# Patient Record
Sex: Female | Born: 1990
Health system: Southern US, Community
[De-identification: ages and names within clinical notes are randomized; demographics above are authoritative.]

## PROBLEM LIST (undated history)

## (undated) DIAGNOSIS — E079 Disorder of thyroid, unspecified: Secondary | ICD-10-CM

## (undated) DIAGNOSIS — E039 Hypothyroidism, unspecified: Secondary | ICD-10-CM

## (undated) DIAGNOSIS — K8020 Calculus of gallbladder without cholecystitis without obstruction: Secondary | ICD-10-CM

## (undated) DIAGNOSIS — G510 Bell's palsy: Secondary | ICD-10-CM

## (undated) DIAGNOSIS — F419 Anxiety disorder, unspecified: Secondary | ICD-10-CM

## (undated) DIAGNOSIS — R519 Headache, unspecified: Secondary | ICD-10-CM

## (undated) DIAGNOSIS — N83209 Unspecified ovarian cyst, unspecified side: Secondary | ICD-10-CM

## (undated) HISTORY — DX: Hypothyroidism, unspecified: E03.9

## (undated) HISTORY — PX: OTHER SURGICAL HISTORY: SHX169

## (undated) HISTORY — DX: Anxiety disorder, unspecified: F41.9

## (undated) HISTORY — DX: Calculus of gallbladder without cholecystitis without obstruction: K80.20

## (undated) HISTORY — PX: NO PAST SURGERIES: SHX2092

## (undated) HISTORY — DX: Disorder of thyroid, unspecified: E07.9

## (undated) HISTORY — DX: Unspecified ovarian cyst, unspecified side: N83.209

## (undated) HISTORY — DX: Bell's palsy: G51.0

---

## 2015-10-23 HISTORY — PX: LASIK: SHX215

## 2017-07-15 ENCOUNTER — Ambulatory Visit (HOSPITAL_BASED_OUTPATIENT_CLINIC_OR_DEPARTMENT_OTHER): Payer: BC Managed Care – PPO

## 2017-07-15 ENCOUNTER — Other Ambulatory Visit: Payer: Self-pay | Admitting: Family Medicine

## 2017-07-15 DIAGNOSIS — R109 Unspecified abdominal pain: Secondary | ICD-10-CM

## 2018-02-27 ENCOUNTER — Encounter: Payer: Self-pay | Admitting: Endocrinology

## 2018-07-18 DIAGNOSIS — J019 Acute sinusitis, unspecified: Secondary | ICD-10-CM | POA: Diagnosis not present

## 2018-07-30 DIAGNOSIS — H0014 Chalazion left upper eyelid: Secondary | ICD-10-CM | POA: Diagnosis not present

## 2018-09-08 DIAGNOSIS — E041 Nontoxic single thyroid nodule: Secondary | ICD-10-CM | POA: Diagnosis not present

## 2018-09-08 DIAGNOSIS — M2550 Pain in unspecified joint: Secondary | ICD-10-CM | POA: Diagnosis not present

## 2018-09-08 DIAGNOSIS — R946 Abnormal results of thyroid function studies: Secondary | ICD-10-CM | POA: Diagnosis not present

## 2018-09-08 DIAGNOSIS — R5383 Other fatigue: Secondary | ICD-10-CM | POA: Diagnosis not present

## 2018-09-08 DIAGNOSIS — Z8349 Family history of other endocrine, nutritional and metabolic diseases: Secondary | ICD-10-CM | POA: Diagnosis not present

## 2018-10-23 DIAGNOSIS — E038 Other specified hypothyroidism: Secondary | ICD-10-CM | POA: Diagnosis not present

## 2018-11-12 DIAGNOSIS — N926 Irregular menstruation, unspecified: Secondary | ICD-10-CM | POA: Diagnosis not present

## 2018-11-12 DIAGNOSIS — Z3202 Encounter for pregnancy test, result negative: Secondary | ICD-10-CM | POA: Diagnosis not present

## 2018-11-19 DIAGNOSIS — E039 Hypothyroidism, unspecified: Secondary | ICD-10-CM | POA: Diagnosis not present

## 2018-12-01 DIAGNOSIS — N390 Urinary tract infection, site not specified: Secondary | ICD-10-CM | POA: Diagnosis not present

## 2018-12-16 DIAGNOSIS — J019 Acute sinusitis, unspecified: Secondary | ICD-10-CM | POA: Diagnosis not present

## 2018-12-17 ENCOUNTER — Telehealth: Payer: Self-pay | Admitting: Internal Medicine

## 2018-12-17 NOTE — Telephone Encounter (Signed)
Unable to leave message. Called to inform patient on how to become a new patient.

## 2018-12-26 ENCOUNTER — Encounter: Payer: Self-pay | Admitting: Family Medicine

## 2018-12-26 ENCOUNTER — Ambulatory Visit: Payer: 59 | Admitting: Family Medicine

## 2018-12-26 VITALS — BP 118/82 | HR 77 | Temp 98.0°F | Ht 64.0 in | Wt 171.4 lb

## 2018-12-26 DIAGNOSIS — E039 Hypothyroidism, unspecified: Secondary | ICD-10-CM | POA: Diagnosis not present

## 2018-12-26 DIAGNOSIS — Z7689 Persons encountering health services in other specified circumstances: Secondary | ICD-10-CM | POA: Diagnosis not present

## 2018-12-26 LAB — TSH: TSH: 0.98 mIU/L

## 2018-12-26 LAB — T4, FREE: Free T4: 1.1 ng/dL (ref 0.8–1.8)

## 2018-12-26 NOTE — Progress Notes (Signed)
Sarah [name] is a 28 y.o. female  Chief Complaint  Patient presents with  . Establish Care    est care/ TSH check and TSH med refill.    HPI: Sarah [name] is a 28 y.o. female here to establish care with our office. She is from [place] and moved to [place] 2 years ago. Pt is a high school Retail buyer.  Pt has a h/o hypothyroidism and is due to have thyroid function checked and her medication refilled. She was diagnosed 3 years ago. She has been on the current dose of NP thyroid x 2 mo.  Pt does not take biotin supplement.   Last CPE, labs: 2019  Last PAP: 2018 - normal, as per pt  Med refills needed today: NP thyroid 30mg    Past Medical History:  Diagnosis Date  . Anxiety   . Bell's palsy   . Ovarian cyst   . Thyroid disease     History reviewed. No pertinent surgical history.  Social History   Socioeconomic History  . Marital status: Married    Spouse name: Not on file  . Number of children: Not on file  . Years of education: Not on file  . Highest education level: Not on file  Occupational History  . Not on file  Social Needs  . Financial resource strain: Not on file  . Food insecurity:    Worry: Not on file    Inability: Not on file  . Transportation needs:    Medical: Not on file    Non-medical: Not on file  Tobacco Use  . Smoking status: Never Smoker  . Smokeless tobacco: Never Used  Substance and Sexual Activity  . Alcohol use: Yes    Comment: socially  . Drug use: Never  . Sexual activity: Not on file  Lifestyle  . Physical activity:    Days per week: Not on file    Minutes per session: Not on file  . Stress: Not on file  Relationships  . Social connections:    Talks on phone: Not on file    Gets together: Not on file    Attends religious service: Not on file    Active member of club or organization: Not on file    Attends meetings of clubs or organizations: Not on file    Relationship status: Not on file  . Intimate partner violence:    Fear of current or ex partner: Not on file    Emotionally abused: Not on file    Physically abused: Not on file    Forced sexual activity: Not on file  Other Topics Concern  . Not on file  Social History Narrative  . Not on file    Family History  Problem Relation Age of Onset  . Hypertension Mother   . Hashimoto's thyroiditis Mother   . Cancer Maternal Grandmother        bone marrow cancer      There is no immunization history on file for this patient.  Outpatient Encounter Medications as of 12/26/2018  Medication Sig  . NP THYROID 30 MG tablet    No facility-administered encounter medications on file as of 12/26/2018.      ROS: Gen: no fever, chills  Skin: no rash, itching ENT: no ear pain, ear drainage, nasal congestion, rhinorrhea, sinus pressure, sore throat Eyes: no blurry vision, double vision Resp: no cough, wheeze,SOB CV: no CP, palpitations, LE edema,  GI: no heartburn, n/v/d/c, abd pain GU: no dysuria, urgency,  frequency, hematuria  MSK: no joint pain, myalgias, back pain Neuro: no dizziness, headache, weakness, vertigo Psych: no depression, anxiety, insomnia   Allergies  Allergen Reactions  . Azithromycin     GI-- stomach cramps  . Penicillins Hives    BP 118/82   Pulse 77   Temp 98 F (36.7 C) (Oral)   Ht 5\' 4"  (1.626 m)   Wt 171 lb 6.4 oz (77.7 kg)   SpO2 98%   BMI 29.42 kg/m   Physical Exam  Constitutional: She is oriented to person, place, and time. She appears well-developed and well-nourished. No distress.  Neck: Neck supple. No thyromegaly present.  Cardiovascular: Normal rate, regular rhythm and normal heart sounds.  Pulmonary/Chest: Effort normal and breath sounds normal.  Neurological: She is alert and oriented to person, place, and time.  Psychiatric: She has a normal mood and affect. Her behavior is normal.    A/P:  1. Hypothyroidism, unspecified type - T4, free - TSH - will cont current med/dose at this time and refill  once lab results available or change dose if necessary  2. Encounter to establish care with new doctor - due in summer 2020 for CPE, labs, PAP

## 2018-12-26 NOTE — Patient Instructions (Signed)
Hypothyroidism  Hypothyroidism is when the thyroid gland does not make enough of certain hormones (it is underactive). The thyroid gland is a small gland located in the lower front part of the neck, just in front of the windpipe (trachea). This gland makes hormones that help control how the body uses food for energy (metabolism) as well as how the heart and brain function. These hormones also play a role in keeping your bones strong. When the thyroid is underactive, it produces too little of the hormones thyroxine (T4) and triiodothyronine (T3). What are the causes? This condition may be caused by:  Hashimoto's disease. This is a disease in which the body's disease-fighting system (immune system) attacks the thyroid gland. This is the most common cause.  Viral infections.  Pregnancy.  Certain medicines.  Birth defects.  Past radiation treatments to the head or neck for cancer.  Past treatment with radioactive iodine.  Past exposure to radiation in the environment.  Past surgical removal of part or all of the thyroid.  Problems with a gland in the center of the brain (pituitary gland).  Lack of enough iodine in the diet. What increases the risk? You are more likely to develop this condition if:  You are female.  You have a family history of thyroid conditions.  You use a medicine called lithium.  You take medicines that affect the immune system (immunosuppressants). What are the signs or symptoms? Symptoms of this condition include:  Feeling as though you have no energy (lethargy).  Not being able to tolerate cold.  Weight gain that is not explained by a change in diet or exercise habits.  Lack of appetite.  Dry skin.  Coarse hair.  Menstrual irregularity.  Slowing of thought processes.  Constipation.  Sadness or depression. How is this diagnosed? This condition may be diagnosed based on:  Your symptoms, your medical history, and a physical exam.  Blood  tests. You may also have imaging tests, such as an ultrasound or MRI. How is this treated? This condition is treated with medicine that replaces the thyroid hormones that your body does not make. After you begin treatment, it may take several weeks for symptoms to go away. Follow these instructions at home:  Take over-the-counter and prescription medicines only as told by your health care provider.  If you start taking any new medicines, tell your health care provider.  Keep all follow-up visits as told by your health care provider. This is important. ? As your condition improves, your dosage of thyroid hormone medicine may change. ? You will need to have blood tests regularly so that your health care provider can monitor your condition. Contact a health care provider if:  Your symptoms do not get better with treatment.  You are taking thyroid replacement medicine and you: ? Sweat a lot. ? Have tremors. ? Feel anxious. ? Lose weight rapidly. ? Cannot tolerate heat. ? Have emotional swings. ? Have diarrhea. ? Feel weak. Get help right away if you have:  Chest pain.  An irregular heartbeat.  A rapid heartbeat.  Difficulty breathing. Summary  Hypothyroidism is when the thyroid gland does not make enough of certain hormones (it is underactive).  When the thyroid is underactive, it produces too little of the hormones thyroxine (T4) and triiodothyronine (T3).  The most common cause is Hashimoto's disease, a disease in which the body's disease-fighting system (immune system) attacks the thyroid gland. The condition can also be caused by viral infections, medicine, pregnancy, or past   radiation treatment to the head or neck.  Symptoms may include weight gain, dry skin, constipation, feeling as though you do not have energy, and not being able to tolerate cold.  This condition is treated with medicine to replace the thyroid hormones that your body does not make. This information  is not intended to replace advice given to you by your health care provider. Make sure you discuss any questions you have with your health care provider. Document Released: 10/08/2005 Document Revised: 09/18/2017 Document Reviewed: 09/18/2017 Elsevier Interactive Patient Education  2019 Elsevier Inc.  

## 2018-12-31 ENCOUNTER — Other Ambulatory Visit: Payer: Self-pay

## 2018-12-31 DIAGNOSIS — E039 Hypothyroidism, unspecified: Secondary | ICD-10-CM

## 2018-12-31 MED ORDER — NP THYROID 30 MG PO TABS: 30.0000 mg | ORAL_TABLET | Freq: Every day | ORAL | 3 refills | Status: DC

## 2019-02-10 ENCOUNTER — Telehealth: Payer: Self-pay | Admitting: Family Medicine

## 2019-02-10 ENCOUNTER — Encounter: Payer: Self-pay | Admitting: Family Medicine

## 2019-02-10 DIAGNOSIS — Z3201 Encounter for pregnancy test, result positive: Secondary | ICD-10-CM

## 2019-02-10 NOTE — Telephone Encounter (Signed)
Pt aware that I am waiting to get an okay from Dr. Salena Saner to place this order and that as soon as lab is placed I will call and make her a lab appointment.

## 2019-02-10 NOTE — Telephone Encounter (Signed)
Patient called and said she had a positive result on home pregnancy test and wanted to set up labs to confirm, There are no orders for labs in and I wasn't sure if she needed an appointment first. I told her we will call her back with an answer for this.

## 2019-02-11 ENCOUNTER — Telehealth: Payer: Self-pay

## 2019-02-11 ENCOUNTER — Other Ambulatory Visit (INDEPENDENT_AMBULATORY_CARE_PROVIDER_SITE_OTHER): Payer: 59

## 2019-02-11 ENCOUNTER — Encounter: Payer: Self-pay | Admitting: Family Medicine

## 2019-02-11 DIAGNOSIS — Z3201 Encounter for pregnancy test, result positive: Secondary | ICD-10-CM

## 2019-02-11 LAB — HCG, QUANTITATIVE, PREGNANCY: Quantitative HCG: 60.37 m[IU]/mL

## 2019-02-11 NOTE — Telephone Encounter (Signed)
Pt aware of lab beng placed and lab appointment made.

## 2019-02-11 NOTE — Progress Notes (Signed)
Patient and lab staff wore face masks for visit per COVID-19 protocol.  

## 2019-02-11 NOTE — Telephone Encounter (Signed)
Questions for Screening COVID-19  Symptom onset:n/a  Travel or Contacts: no travel no contact  During this illness, did/does the patient experience any of the following symptoms? Fever >100.5F []   Yes [x]   No []   Unknown Subjective fever (felt feverish) []   Yes [x]   No []   Unknown Chills []   Yes [x]   No []   Unknown Muscle aches (myalgia) []   Yes [x]   No []   Unknown Runny nose (rhinorrhea) []   Yes [x]   No []   Unknown Sore throat []   Yes [x]   No []   Unknown Cough (new onset or worsening of chronic cough) []   Yes [x]   No []   Unknown Shortness of breath (dyspnea) []   Yes [x]   No []   Unknown Nausea or vomiting []   Yes [x]   No []   Unknown Headache []   Yes [x]   No []   Unknown Abdominal pain  []   Yes [x]   No []   Unknown Diarrhea (?3 loose/looser than normal stools/24hr period) []   Yes [x]   No []   Unknown Other, specify:  Patient risk factors: Smoker? []   Current []   Former []   Never If female, currently pregnant? []   Yes []   No  There are no active problems to display for this patient.   Plan:  []   High risk for COVID-19 with red flags go to ED (with CP, SOB, weak/lightheaded, or fever > 101.5). Call ahead.  []   High risk for COVID-19 but stable. Inform provider and coordinate time for [facility] visit.   []   No red flags but URI signs or symptoms okay for [facility] visit.

## 2019-02-13 ENCOUNTER — Encounter: Payer: Self-pay | Admitting: Family Medicine

## 2019-02-13 ENCOUNTER — Other Ambulatory Visit (INDEPENDENT_AMBULATORY_CARE_PROVIDER_SITE_OTHER): Payer: 59

## 2019-02-13 ENCOUNTER — Other Ambulatory Visit: Payer: Self-pay | Admitting: Family Medicine

## 2019-02-13 ENCOUNTER — Telehealth: Payer: Self-pay | Admitting: Family Medicine

## 2019-02-13 DIAGNOSIS — Z3A01 Less than 8 weeks gestation of pregnancy: Secondary | ICD-10-CM

## 2019-02-13 DIAGNOSIS — N939 Abnormal uterine and vaginal bleeding, unspecified: Secondary | ICD-10-CM

## 2019-02-13 LAB — HCG, QUANTITATIVE, PREGNANCY: Quantitative HCG: 208.29 m[IU]/mL

## 2019-02-13 NOTE — Progress Notes (Signed)
Patient and lab staff wore face masks for visit per COVID-19 protocol.  

## 2019-02-13 NOTE — Telephone Encounter (Signed)
Encounter opened in error

## 2019-02-16 ENCOUNTER — Encounter: Payer: Self-pay | Admitting: Advanced Practice Midwife

## 2019-02-17 ENCOUNTER — Other Ambulatory Visit: Payer: Self-pay | Admitting: Advanced Practice Midwife

## 2019-02-17 ENCOUNTER — Encounter: Payer: Self-pay | Admitting: Advanced Practice Midwife

## 2019-02-17 MED ORDER — CONCEPT OB 130-92.4-1 MG PO CAPS: 1.0000 | ORAL_CAPSULE | Freq: Every day | ORAL | 11 refills | Status: DC

## 2019-02-23 ENCOUNTER — Encounter: Payer: Self-pay | Admitting: Advanced Practice Midwife

## 2019-02-25 ENCOUNTER — Other Ambulatory Visit: Payer: Self-pay | Admitting: Advanced Practice Midwife

## 2019-02-25 ENCOUNTER — Encounter: Payer: Self-pay | Admitting: *Deleted

## 2019-02-25 DIAGNOSIS — O26899 Other specified pregnancy related conditions, unspecified trimester: Secondary | ICD-10-CM

## 2019-02-25 DIAGNOSIS — O3680X1 Pregnancy with inconclusive fetal viability, fetus 1: Secondary | ICD-10-CM

## 2019-02-25 DIAGNOSIS — R109 Unspecified abdominal pain: Secondary | ICD-10-CM

## 2019-03-05 ENCOUNTER — Other Ambulatory Visit: Payer: Self-pay | Admitting: Advanced Practice Midwife

## 2019-03-05 ENCOUNTER — Encounter: Payer: Self-pay | Admitting: Advanced Practice Midwife

## 2019-03-05 ENCOUNTER — Telehealth: Payer: 59 | Admitting: Physician Assistant

## 2019-03-05 ENCOUNTER — Other Ambulatory Visit: Payer: Self-pay

## 2019-03-05 ENCOUNTER — Ambulatory Visit (HOSPITAL_COMMUNITY)
Admission: RE | Admit: 2019-03-05 | Discharge: 2019-03-05 | Disposition: A | Discharge status: Home / Self Care | Payer: 59 | Source: Ambulatory Visit | Attending: Advanced Practice Midwife | Admitting: Advanced Practice Midwife

## 2019-03-05 DIAGNOSIS — R300 Dysuria: Secondary | ICD-10-CM

## 2019-03-05 DIAGNOSIS — Z3490 Encounter for supervision of normal pregnancy, unspecified, unspecified trimester: Secondary | ICD-10-CM

## 2019-03-05 DIAGNOSIS — O3680X1 Pregnancy with inconclusive fetal viability, fetus 1: Secondary | ICD-10-CM | POA: Diagnosis not present

## 2019-03-05 DIAGNOSIS — R109 Unspecified abdominal pain: Secondary | ICD-10-CM | POA: Insufficient documentation

## 2019-03-05 DIAGNOSIS — O26899 Other specified pregnancy related conditions, unspecified trimester: Secondary | ICD-10-CM | POA: Insufficient documentation

## 2019-03-05 MED ORDER — NITROFURANTOIN MONOHYD MACRO 100 MG PO CAPS: 100.0000 mg | ORAL_CAPSULE | Freq: Two times a day (BID) | ORAL | 1 refills | Status: DC

## 2019-03-05 NOTE — Progress Notes (Signed)
For the safety of you and your child, I recommend a face to face office visit with a health care provider.  Many mothers need to take medicines during their pregnancy and while nursing.  Almost all medicines pass into the breast milk in small quantities.  Most are generally considered safe for a mother to take but some medicines must be avoided.  After reviewing your E-Visit request, I recommend that you consult your OB/GYN or pediatrician for medical advice in relation to your condition and prescription medications while pregnant or breastfeeding.    NOTE: If you entered your credit card information for this eVisit, you will not be charged. You may see a "hold" on your card for the $35 but that hold will drop off and you will not have a charge processed.  If you are having a true medical emergency please call 911.  If you need an urgent face to face visit, [facility] has four urgent care centers for your convenience.  If you need care fast and have a high deductible or no insurance consider:   [email or web address] to reserve your spot online an avoid wait times  [facility] [address], [address] [place], [place] [address] Modified hours of operation: Monday-Friday, 12 PM to 6 PM  Saturday & Sunday 10 AM to 4 PM  [facility] (New Address!) [address], [address] [place], [place] [address] *Just off [address], across the road from [employer]* Modified hours of operation: Monday-Friday, 12 PM to 6 PM  Closed Saturday & Sunday  [facility]'s modified hours of operation will be in effect from May 1 until May 31  The following sites will take your  insurance:  . [facility]  [phone] Get Driving Directions Find a Provider at this Location  [address] [place], [place] [address] . 10 am to 8 pm Monday-Friday . 12 pm to 8 pm Saturday-Sunday   . [facility]   [phone] Get Driving Directions Find a Provider at this Location  [address], [address] [place], [place] [address] . 8 am to 8 pm Monday-Friday . 9 am to 6 pm Saturday . 11 am to 6 pm Sunday   .  [facility] [place]  [phone] Get Driving Directions  [address] [place], [place] [address] . 8 am to 8 pm Monday-Friday . 8 am to 4 pm Saturday-Sunday   Your e-visit answers were reviewed by a board certified advanced clinical practitioner to complete your personal care plan.  Thank you for using e-Visits.  

## 2019-03-12 ENCOUNTER — Other Ambulatory Visit: Payer: Self-pay | Admitting: Advanced Practice Midwife

## 2019-03-12 MED ORDER — DOXYLAMINE-PYRIDOXINE 10-10 MG PO TBEC: DELAYED_RELEASE_TABLET | ORAL | 5 refills | Status: DC

## 2019-03-12 NOTE — Progress Notes (Signed)
Pt with n/v of pregnancy unrelieved by B6.  Rx for Diclegis sent to pharmacy. Message sent to [employer] to complete prior authorization.

## 2019-03-18 ENCOUNTER — Other Ambulatory Visit: Payer: Self-pay | Admitting: Advanced Practice Midwife

## 2019-03-18 MED ORDER — METOCLOPRAMIDE HCL 10 MG PO TABS: 10.0000 mg | ORAL_TABLET | Freq: Four times a day (QID) | ORAL | 5 refills | Status: DC | PRN

## 2019-03-18 NOTE — Progress Notes (Signed)
Pt sent message that she is still waiting for Diclegis/prior authorization and reports daily nausea, with occasional vomiting but this is associated with dizziness and fatigue.  Rx for Reglan 10 mg Q 6 hours PRN. Message sent to move pt new OB visit sooner if possible.  Pt instructed to go to MAU if symptoms are severe.

## 2019-03-26 ENCOUNTER — Other Ambulatory Visit: Payer: Self-pay

## 2019-03-26 ENCOUNTER — Inpatient Hospital Stay (HOSPITAL_COMMUNITY)
Admission: AD | Admit: 2019-03-26 | Discharge: 2019-03-26 | Disposition: A | Discharge status: Home / Self Care | Payer: 59 | Attending: Obstetrics and Gynecology | Admitting: Obstetrics and Gynecology

## 2019-03-26 ENCOUNTER — Encounter (HOSPITAL_COMMUNITY): Payer: Self-pay | Admitting: *Deleted

## 2019-03-26 DIAGNOSIS — O26851 Spotting complicating pregnancy, first trimester: Secondary | ICD-10-CM | POA: Insufficient documentation

## 2019-03-26 DIAGNOSIS — Z880 Allergy status to penicillin: Secondary | ICD-10-CM | POA: Insufficient documentation

## 2019-03-26 DIAGNOSIS — Z6790 Unspecified blood type, Rh positive: Secondary | ICD-10-CM

## 2019-03-26 DIAGNOSIS — B3731 Acute candidiasis of vulva and vagina: Secondary | ICD-10-CM

## 2019-03-26 DIAGNOSIS — O98811 Other maternal infectious and parasitic diseases complicating pregnancy, first trimester: Secondary | ICD-10-CM | POA: Diagnosis not present

## 2019-03-26 DIAGNOSIS — B373 Candidiasis of vulva and vagina: Secondary | ICD-10-CM | POA: Diagnosis not present

## 2019-03-26 DIAGNOSIS — Z3A10 10 weeks gestation of pregnancy: Secondary | ICD-10-CM | POA: Diagnosis not present

## 2019-03-26 DIAGNOSIS — O4691 Antepartum hemorrhage, unspecified, first trimester: Secondary | ICD-10-CM | POA: Diagnosis not present

## 2019-03-26 DIAGNOSIS — Z362 Encounter for other antenatal screening follow-up: Secondary | ICD-10-CM

## 2019-03-26 DIAGNOSIS — O4690 Antepartum hemorrhage, unspecified, unspecified trimester: Secondary | ICD-10-CM

## 2019-03-26 DIAGNOSIS — O23591 Infection of other part of genital tract in pregnancy, first trimester: Secondary | ICD-10-CM

## 2019-03-26 LAB — URINALYSIS, ROUTINE W REFLEX MICROSCOPIC
Bilirubin Urine: NEGATIVE
Glucose, UA: NEGATIVE mg/dL
Hgb urine dipstick: NEGATIVE
Ketones, ur: NEGATIVE mg/dL
Leukocytes,Ua: NEGATIVE
Nitrite: NEGATIVE
Protein, ur: NEGATIVE mg/dL
Specific Gravity, Urine: 1.004 — ABNORMAL LOW (ref 1.005–1.030)
pH: 6 (ref 5.0–8.0)

## 2019-03-26 LAB — WET PREP, GENITAL
Clue Cells Wet Prep HPF POC: NONE SEEN
Sperm: NONE SEEN
Trich, Wet Prep: NONE SEEN
Yeast Wet Prep HPF POC: NONE SEEN

## 2019-03-26 LAB — CBC
HCT: 41.8 % (ref 36.0–46.0)
Hemoglobin: 14.3 g/dL (ref 12.0–15.0)
MCH: 28.4 pg (ref 26.0–34.0)
MCHC: 34.2 g/dL (ref 30.0–36.0)
MCV: 83.1 fL (ref 80.0–100.0)
Platelets: 229 10*3/uL (ref 150–400)
RBC: 5.03 MIL/uL (ref 3.87–5.11)
RDW: 12.3 % (ref 11.5–15.5)
WBC: 8.6 10*3/uL (ref 4.0–10.5)
nRBC: 0 % (ref 0.0–0.2)

## 2019-03-26 LAB — ABO/RH: ABO/RH(D): O POS

## 2019-03-26 MED ORDER — TERCONAZOLE 0.4 % VA CREA: 1.0000 | TOPICAL_CREAM | Freq: Every day | VAGINAL | 0 refills | Status: DC

## 2019-03-26 NOTE — MAU Provider Note (Signed)
History     CSN: [id number]  Arrival date and time: 03/26/19 [id number]   First Provider Initiated Contact with Patient 03/26/19 1034      Chief Complaint  Patient presents with  . Vaginal Bleeding   28 y.o. G1 @10 .1 presenting with cramping and spotting. Spotting started yesterday. Looks pink and brown on the toilet paper when she wipes. No recent IC. Abdominal cramping has been ongoing for 5 weeks, intermittent. Feels like menstrual cramps. Rates 3/10. Has not taken anything for it. Denies urinary sx. No vaginal discharge or itching.   OB History    Gravida  1   Para      Term      Preterm      AB      Living        SAB      TAB      Ectopic      Multiple      Live Births              Past Medical History:  Diagnosis Date  . Anxiety   . Bell's palsy   . Ovarian cyst   . Thyroid disease     History reviewed. No pertinent surgical history.  Family History  Problem Relation Age of Onset  . Hypertension Mother   . Hashimoto's thyroiditis Mother   . Cancer Maternal Grandmother        bone marrow cancer    Social History   Tobacco Use  . Smoking status: Never Smoker  . Smokeless tobacco: Never Used  Substance Use Topics  . Alcohol use: Yes    Comment: socially  . Drug use: Never    Allergies:  Allergies  Allergen Reactions  . Azithromycin     GI-- stomach cramps  . Penicillins Hives    Medications Prior to Admission  Medication Sig Dispense Refill Last Dose  . NP THYROID 30 MG tablet Take 1 tablet (30 mg total) by mouth daily before breakfast. 90 tablet 3 03/26/2019 at Unknown time  . Prenat w/o A Vit-FeFum-FePo-FA (CONCEPT OB) 130-92.4-1 MG CAPS Take 1 capsule by mouth daily. 30 capsule 11 03/26/2019 at Unknown time  . Doxylamine-Pyridoxine (DICLEGIS) 10-10 MG TBEC Take 2 tabs at bedtime. If needed, add another tab in the morning. If needed, add another tab in the afternoon, up to 4 tabs/day. 100 tablet 5   . metoCLOPramide (REGLAN) 10 MG  tablet Take 1 tablet (10 mg total) by mouth every 6 (six) hours as needed for nausea. 60 tablet 5   . nitrofurantoin, macrocrystal-monohydrate, (MACROBID) 100 MG capsule Take 1 capsule (100 mg total) by mouth 2 (two) times daily. 14 capsule 1     Review of Systems  Constitutional: Negative for fever.  Gastrointestinal: Positive for abdominal pain, constipation and nausea. Negative for vomiting.  Genitourinary: Positive for vaginal bleeding. Negative for dysuria, hematuria, urgency and vaginal discharge.   Physical Exam   Blood pressure 124/79, pulse 76, temperature 98.2 F (36.8 C), resp. rate 16, height 5\' 4"  (1.626 m), weight 80.3 kg, last menstrual period 01/14/2019.  Physical Exam  Nursing note and vitals reviewed. Constitutional: She is oriented to person, place, and time. She appears well-developed and well-nourished. No distress.  HENT:  Head: Normocephalic and atraumatic.  Neck: Normal range of motion.  Respiratory: Effort normal. No respiratory distress.  GI: Soft. She exhibits no distension and no mass. There is no abdominal tenderness. There is no rebound and no guarding.  Genitourinary:  Genitourinary Comments: External: no lesions or erythema Vagina: rugated, pink, moist, large amt thick curdy light yellow discharge Cervix closed    Musculoskeletal: Normal range of motion.  Neurological: She is alert and oriented to person, place, and time.  Skin: Skin is warm and dry.  Psychiatric: She has a normal mood and affect.   Limited bedside US: viable, active fetus, +cardiac activity, subj. nml AFV, no SCH seen  Results for orders placed or performed during the hospital encounter of 03/26/19 (from the past 24 hour(s))  CBC     Status: None   Collection Time: 03/26/19 10:34 AM  Result Value Ref Range   WBC 8.6 4.0 - 10.5 K/uL   RBC 5.03 3.87 - 5.11 MIL/uL   Hemoglobin 14.3 12.0 - 15.0 g/dL   HCT 40.9 81.1 - 91.4 %   MCV 83.1 80.0 - 100.0 fL   MCH 28.4 26.0 - 34.0 pg    MCHC 34.2 30.0 - 36.0 g/dL   RDW 78.2 95.6 - 21.3 %   Platelets 229 150 - 400 K/uL   nRBC 0.0 0.0 - 0.2 %  ABO/Rh     Status: None   Collection Time: 03/26/19 10:34 AM  Result Value Ref Range   ABO/RH(D) O POS    No rh immune globuloin      NOT A RH IMMUNE GLOBULIN CANDIDATE, PT RH POSITIVE Performed at [facility], [address], [place], [place] [address]   Wet prep, genital     Status: Abnormal   Collection Time: 03/26/19 10:48 AM  Result Value Ref Range   Yeast Wet Prep HPF POC NONE SEEN NONE SEEN   Trich, Wet Prep NONE SEEN NONE SEEN   Clue Cells Wet Prep HPF POC NONE SEEN NONE SEEN   WBC, Wet Prep HPF POC MODERATE (A) NONE SEEN   Sperm NONE SEEN   Urinalysis, Routine w reflex microscopic     Status: Abnormal   Collection Time: 03/26/19 10:48 AM  Result Value Ref Range   Color, Urine STRAW (A) YELLOW   APPearance CLEAR CLEAR   Specific Gravity, Urine 1.004 (L) 1.005 - 1.030   pH 6.0 5.0 - 8.0   Glucose, UA NEGATIVE NEGATIVE mg/dL   Hgb urine dipstick NEGATIVE NEGATIVE   Bilirubin Urine NEGATIVE NEGATIVE   Ketones, ur NEGATIVE NEGATIVE mg/dL   Protein, ur NEGATIVE NEGATIVE mg/dL   Nitrite NEGATIVE NEGATIVE   Leukocytes,Ua NEGATIVE NEGATIVE   MAU Course  Procedures Orders Placed This Encounter  Procedures  . Wet prep, genital    Standing Status:   Standing    Number of Occurrences:   1    Order Specific Question:   Patient immune status    Answer:   Normal  . CBC    Standing Status:   Standing    Number of Occurrences:   1  . Urinalysis, Routine w reflex microscopic    Standing Status:   Standing    Number of Occurrences:   1  . ABO/Rh    Standing Status:   Standing    Number of Occurrences:   1  . Discharge patient    Order Specific Question:   Discharge disposition    Answer:   01-Home or Self Care [1]    Order Specific Question:   Discharge patient date    Answer:   03/26/2019   MDM Labs ordered and reviewed. US shows viable fetus, pt  reassured. Will treat yeast infection. Stable for discharge home.  Assessment and Plan   1. [redacted] weeks gestation of pregnancy   2. Vaginal bleeding in pregnancy   3. Blood type, Rh positive   4. Yeast vaginitis    Discharge home Follow up at [facility] next week as scheduled SAB precautions Rx Terazol  Allergies as of 03/26/2019      Reactions   Azithromycin    GI-- stomach cramps   Penicillins Hives      Medication List    STOP taking these medications   nitrofurantoin (macrocrystal-monohydrate) 100 MG capsule Commonly known as:  MACROBID     TAKE these medications   Concept OB 130-92.4-1 MG Caps Take 1 capsule by mouth daily.   Doxylamine-Pyridoxine 10-10 MG Tbec Commonly known as:  Diclegis Take 2 tabs at bedtime. If needed, add another tab in the morning. If needed, add another tab in the afternoon, up to 4 tabs/day.   metoCLOPramide 10 MG tablet Commonly known as:  Reglan Take 1 tablet (10 mg total) by mouth every 6 (six) hours as needed for nausea.   NP Thyroid 30 MG tablet Generic drug:  thyroid Take 1 tablet (30 mg total) by mouth daily before breakfast.   terconazole 0.4 % vaginal cream Commonly known as:  TERAZOL 7 Place 1 applicator vaginally at bedtime.      Donette Larry, CNM 03/26/2019, 11:56 AM

## 2019-03-26 NOTE — Discharge Instructions (Signed)
Vaginal Yeast infection, Adult ° °Vaginal yeast infection is a condition that causes vaginal discharge as well as soreness, swelling, and redness (inflammation) of the vagina. This is a common condition. Some women get this infection frequently. °What are the causes? °This condition is caused by a change in the normal balance of the yeast (candida) and bacteria that live in the vagina. This change causes an overgrowth of yeast, which causes the inflammation. °What increases the risk? °The condition is more likely to develop in women who: °· Take antibiotic medicines. °· Have diabetes. °· Take birth control pills. °· Are pregnant. °· Douche often. °· Have a weak body defense system (immune system). °· Have been taking steroid medicines for a long time. °· Frequently wear tight clothing. °What are the signs or symptoms? °Symptoms of this condition include: °· White, thick, creamy vaginal discharge. °· Swelling, itching, redness, and irritation of the vagina. The lips of the vagina (vulva) may be affected as well. °· Pain or a burning feeling while urinating. °· Pain during sex. °How is this diagnosed? °This condition is diagnosed based on: °· Your medical history. °· A physical exam. °· A pelvic exam. Your health care provider will examine a sample of your vaginal discharge under a microscope. Your health care provider may send this sample for testing to confirm the diagnosis. °How is this treated? °This condition is treated with medicine. Medicines may be over-the-counter or prescription. You may be told to use one or more of the following: °· Medicine that is taken by mouth (orally). °· Medicine that is applied as a cream (topically). °· Medicine that is inserted directly into the vagina (suppository). °Follow these instructions at home: ° °Lifestyle °· Do not have sex until your health care provider approves. Tell your sex partner that you have a yeast infection. That person should go to his or her health care  provider and ask if they should also be treated. °· Do not wear tight clothes, such as pantyhose or tight pants. °· Wear breathable cotton underwear. °General instructions °· Take or apply over-the-counter and prescription medicines only as told by your health care provider. °· Eat more yogurt. This may help to keep your yeast infection from returning. °· Do not use tampons until your health care provider approves. °· Try taking a sitz bath to help with discomfort. This is a warm water bath that is taken while you are sitting down. The water should only come up to your hips and should cover your buttocks. Do this 3-4 times per day or as told by your health care provider. °· Do not douche. °· If you have diabetes, keep your blood sugar levels under control. °· Keep all follow-up visits as told by your health care provider. This is important. °Contact a health care provider if: °· You have a fever. °· Your symptoms go away and then return. °· Your symptoms do not get better with treatment. °· Your symptoms get worse. °· You have new symptoms. °· You develop blisters in or around your vagina. °· You have blood coming from your vagina and it is not your menstrual period. °· You develop pain in your abdomen. °Summary °· Vaginal yeast infection is a condition that causes discharge as well as soreness, swelling, and redness (inflammation) of the vagina. °· This condition is treated with medicine. Medicines may be over-the-counter or prescription. °· Take or apply over-the-counter and prescription medicines only as told by your health care provider. °· Do not douche.   Do not have sex or use tampons until your health care provider approves. °· Contact a health care provider if your symptoms do not get better with treatment or your symptoms go away and then return. °This information is not intended to replace advice given to you by your health care provider. Make sure you discuss any questions you have with your health care  provider. °Document Released: 07/18/2005 Document Revised: 02/24/2018 Document Reviewed: 02/24/2018 °Elsevier Interactive Patient Education © 2019 [employer] ° ° °Vaginal Bleeding During Pregnancy, First Trimester ° °A small amount of bleeding (spotting) from the vagina is common during early pregnancy. Sometimes the bleeding is normal and does not cause problems. At other times, though, bleeding may be a sign of something serious. Tell your doctor about any bleeding from your vagina right away. °Follow these instructions at home: °Activity °· Follow your doctor's instructions about how active you can be. °· If needed, make plans for someone to help with your normal activities. °· Do not have sex or orgasms until your doctor says that this is safe. °General instructions °· Take over-the-counter and prescription medicines only as told by your doctor. °· Watch your condition for any changes. °· Write down: °? The number of pads you use each day. °? How often you change pads. °? How soaked (saturated) your pads are. °· Do not use tampons. °· Do not douche. °· If you pass any tissue from your vagina, save it to show to your doctor. °· Keep all follow-up visits as told by your doctor. This is important. °Contact a doctor if: °· You have vaginal bleeding at any time while you are pregnant. °· You have cramps. °· You have a fever. °Get help right away if: °· You have very bad cramps in your back or belly (abdomen). °· You pass large clots or a lot of tissue from your vagina. °· Your bleeding gets worse. °· You feel light-headed. °· You feel weak. °· You pass out (faint). °· You have chills. °· You are leaking fluid from your vagina. °· You have a gush of fluid from your vagina. °Summary °· Sometimes vaginal bleeding during pregnancy is normal and does not cause problems. At other times, bleeding may be a sign of something serious. °· Tell your doctor about any bleeding from your vagina right away. °· Follow your  doctor's instructions about how active you can be. You may need someone to help you with your normal activities. °This information is not intended to replace advice given to you by your health care provider. Make sure you discuss any questions you have with your health care provider. °Document Released: 02/22/2014 Document Revised: 01/09/2017 Document Reviewed: 01/09/2017 °Elsevier Interactive Patient Education © 2019 Else[employer] Inc. ° °

## 2019-03-26 NOTE — MAU Note (Signed)
Pt reports she has had some cramping and spotting that started yesterday.. Also c/o feeling light headed at times.

## 2019-03-27 LAB — GC/CHLAMYDIA PROBE AMP (~~LOC~~) NOT AT ARMC
Chlamydia: NEGATIVE
Neisseria Gonorrhea: NEGATIVE

## 2019-03-30 ENCOUNTER — Encounter: Payer: 59 | Admitting: Advanced Practice Midwife

## 2019-03-31 ENCOUNTER — Ambulatory Visit (INDEPENDENT_AMBULATORY_CARE_PROVIDER_SITE_OTHER): Payer: 59 | Admitting: Advanced Practice Midwife

## 2019-03-31 ENCOUNTER — Other Ambulatory Visit: Payer: Self-pay

## 2019-03-31 ENCOUNTER — Encounter: Payer: Self-pay | Admitting: Advanced Practice Midwife

## 2019-03-31 VITALS — BP 122/80 | HR 99 | Temp 99.4°F | Wt 176.0 lb

## 2019-03-31 DIAGNOSIS — B3731 Acute candidiasis of vulva and vagina: Secondary | ICD-10-CM

## 2019-03-31 DIAGNOSIS — Z124 Encounter for screening for malignant neoplasm of cervix: Secondary | ICD-10-CM | POA: Diagnosis not present

## 2019-03-31 DIAGNOSIS — E039 Hypothyroidism, unspecified: Secondary | ICD-10-CM | POA: Insufficient documentation

## 2019-03-31 DIAGNOSIS — O4690 Antepartum hemorrhage, unspecified, unspecified trimester: Secondary | ICD-10-CM

## 2019-03-31 DIAGNOSIS — Z3A10 10 weeks gestation of pregnancy: Secondary | ICD-10-CM

## 2019-03-31 DIAGNOSIS — O468X1 Other antepartum hemorrhage, first trimester: Secondary | ICD-10-CM

## 2019-03-31 DIAGNOSIS — O4691 Antepartum hemorrhage, unspecified, first trimester: Secondary | ICD-10-CM

## 2019-03-31 DIAGNOSIS — O360910 Maternal care for other rhesus isoimmunization, first trimester, not applicable or unspecified: Secondary | ICD-10-CM

## 2019-03-31 DIAGNOSIS — B373 Candidiasis of vulva and vagina: Secondary | ICD-10-CM

## 2019-03-31 DIAGNOSIS — O418X10 Other specified disorders of amniotic fluid and membranes, first trimester, not applicable or unspecified: Secondary | ICD-10-CM

## 2019-03-31 DIAGNOSIS — R42 Dizziness and giddiness: Secondary | ICD-10-CM

## 2019-03-31 DIAGNOSIS — O99281 Endocrine, nutritional and metabolic diseases complicating pregnancy, first trimester: Secondary | ICD-10-CM

## 2019-03-31 DIAGNOSIS — O219 Vomiting of pregnancy, unspecified: Secondary | ICD-10-CM

## 2019-03-31 DIAGNOSIS — Z6790 Unspecified blood type, Rh positive: Secondary | ICD-10-CM

## 2019-03-31 DIAGNOSIS — Z3400 Encounter for supervision of normal first pregnancy, unspecified trimester: Secondary | ICD-10-CM

## 2019-03-31 MED ORDER — BLOOD PRESSURE KIT: PACK | 0 refills | Status: DC

## 2019-03-31 MED ORDER — PROMETHAZINE HCL 25 MG PO TABS: 12.5000 mg | ORAL_TABLET | Freq: Four times a day (QID) | ORAL | 2 refills | Status: DC | PRN

## 2019-03-31 NOTE — Progress Notes (Signed)
Pt presents for NOB visit.  Pt c/o morning sickness.  Pt c/o dizziness  GC/CT and wetprep completed 03/26/2019 at MAU

## 2019-03-31 NOTE — Progress Notes (Signed)
Subjective:   Sarah [name] is a 28 y.o. G1P0 at 24w6dby LMP being seen today for her first obstetrical visit.  Her obstetrical history is significant for none and has Encounter for supervision of low-risk first pregnancy, antepartum and Hypothyroidism affecting pregnancy in first trimester on their problem list.. Patient does intend to breast feed. Pregnancy history fully reviewed.  Patient reports nausea, vomiting and intermittent dizziness, mostly after taking a shower. Symptom is improving..Marland Kitchen HISTORY: OB History  Gravida Para Term Preterm AB Living  1 0 0 0 0 0  SAB TAB Ectopic Multiple Live Births  0 0 0 0 0    # Outcome Date GA Lbr Len/2nd Weight Sex Delivery Anes PTL Lv  1 Current            Past Medical History:  Diagnosis Date  . Anxiety   . Bell's palsy   . Ovarian cyst   . Thyroid disease    History reviewed. No pertinent surgical history. Family History  Problem Relation Age of Onset  . Hypertension Mother   . Hashimoto's thyroiditis Mother   . Cancer Maternal Grandmother        bone marrow cancer   Social History   Tobacco Use  . Smoking status: Never Smoker  . Smokeless tobacco: Never Used  Substance Use Topics  . Alcohol use: Yes    Comment: socially  . Drug use: Never   Allergies  Allergen Reactions  . Azithromycin     GI-- stomach cramps  . Penicillins Hives   Current Outpatient Medications on File Prior to Visit  Medication Sig Dispense Refill  . NP THYROID 30 MG tablet Take 1 tablet (30 mg total) by mouth daily before breakfast. 90 tablet 3  . Prenat w/o A Vit-FeFum-FePo-FA (CONCEPT OB) 130-92.4-1 MG CAPS Take 1 capsule by mouth daily. 30 capsule 11  . terconazole (TERAZOL 7) 0.4 % vaginal cream Place 1 applicator vaginally at bedtime. 45 g 0  . Doxylamine-Pyridoxine (DICLEGIS) 10-10 MG TBEC Take 2 tabs at bedtime. If needed, add another tab in the morning. If needed, add another tab in the afternoon, up to 4 tabs/day. (Patient not  taking: Reported on 03/31/2019) 100 tablet 5  . metoCLOPramide (REGLAN) 10 MG tablet Take 1 tablet (10 mg total) by mouth every 6 (six) hours as needed for nausea. (Patient not taking: Reported on 03/31/2019) 60 tablet 5   No current facility-administered medications on file prior to visit.      Exam   Vitals:   03/31/19 1431 03/31/19 1433  BP: (!) 142/85 122/80  Pulse: 99   Temp: 99.4 F (37.4 C)   Weight: 176 lb (79.8 kg)    Fetal Heart Rate (bpm): 169  Uterus:     Pelvic Exam: Perineum: no hemorrhoids, normal perineum   Vulva: normal external genitalia, no lesions   Vagina:  normal mucosa, normal discharge   Cervix: no lesions and normal, pap smear done.    Adnexa: normal adnexa and no mass, fullness, tenderness   Bony Pelvis: average  System: General: well-developed, well-nourished female in no acute distress   Breast:  normal appearance, no masses or tenderness   Skin: normal coloration and turgor, no rashes   Neurologic: oriented, normal, negative, normal mood   Extremities: normal strength, tone, and muscle mass, ROM of all joints is normal   HEENT PERRLA, extraocular movement intact and sclera clear, anicteric   Mouth/Teeth mucous membranes moist, pharynx normal without lesions and dental  hygiene good   Neck supple and no masses   Cardiovascular: regular rate and rhythm   Respiratory:  no respiratory distress, normal breath sounds   Abdomen: soft, non-tender; bowel sounds normal; no masses,  no organomegaly     Assessment:   Pregnancy: G1P0 Patient Active Problem List   Diagnosis Date Noted  . Encounter for supervision of low-risk first pregnancy, antepartum 03/31/2019  . Hypothyroidism affecting pregnancy in first trimester 03/31/2019     Plan:   1. Encounter for supervision of low-risk first pregnancy, antepartum --Anticipatory guidance about next visits/weeks of pregnancy given. --Reviewed safety, visitor policy, reassurance about COVID-19 for pregnancy at  this time. Discussed possible changes to visits, including televisits, that may occur due to COVID-19.  The office remains open if pt needs to be seen and MAU is open 24 hours/day for OB emergencies.   - Obstetric Panel, Including HIV - Culture, OB Urine - Genetic Screening - [facility] AMB BABYSCRIPTS SCHEDULE OPTIMIZATION - [facility] AMB BABYSCRIPTS OPT IN - Cytology - PAP( [place]) - [place] MFM OB COMP + 14 WK; Future - TSH - Comp Met (CMET)  2. Vaginal bleeding in pregnancy --Small [place] on 6 week [place], also yeast vaginitis treated at recent MAU visit as likely causes of spotting.   --Spotting intermittent, off and on during pregnancy.  3. Nausea and vomiting during pregnancy prior to [redacted] weeks gestation --Unable to get insurance to cover Diclegis.  Pt never picked up Reglan.   --Taking Unisom/B6 with some success. --Rx for Phenergan to use PRN    4. Blood type, Rh positive   5. Yeast vaginitis --Still using Terazol 7  6. Subchorionic hemorrhage of placenta in first trimester, single or unspecified fetus   7. Hypothyroidism affecting pregnancy in first trimester --On NP Thyroid.  TSH at today's visit and Q trimester.   8. Dizziness --Likely mild dehydration r/t n/v.  Hgb 14.1 on 03/26/19.   --Symptom is not resolved but is improving. --Heart and lung sounds wnl --Add CMP to today's labs  Initial labs drawn. Continue prenatal vitamins. Discussed and offered genetic screening options, including Quad screen/AFP, NIPS testing, and option to decline testing. Benefits/risks/alternatives reviewed. Pt aware that anatomy US is form of genetic screening with lower accuracy in detecting trisomies than blood work.  Pt chooses genetic screening today. NIPS: ordered. Ultrasound discussed; fetal anatomic survey: ordered. Problem list reviewed and updated. The nature of [facility] with multiple MDs and other Advanced Practice Providers was explained to patient; also  emphasized that residents, students are part of our team. Routine obstetric precautions reviewed. Return in about 2 weeks (around 04/14/2019).   Fatima Blank, CNM 03/31/19 5:26 PM

## 2019-04-01 ENCOUNTER — Other Ambulatory Visit: Payer: Self-pay

## 2019-04-01 DIAGNOSIS — Z3400 Encounter for supervision of normal first pregnancy, unspecified trimester: Secondary | ICD-10-CM

## 2019-04-01 LAB — CYTOLOGY - PAP
Adequacy: ABSENT
Diagnosis: NEGATIVE

## 2019-04-01 LAB — COMPREHENSIVE METABOLIC PANEL
ALT: 18 IU/L (ref 0–32)
AST: 18 IU/L (ref 0–40)
Albumin/Globulin Ratio: 1.7 (ref 1.2–2.2)
Albumin: 4.3 g/dL (ref 3.9–5.0)
Alkaline Phosphatase: 46 IU/L (ref 39–117)
BUN/Creatinine Ratio: 12 (ref 9–23)
BUN: 7 mg/dL (ref 6–20)
Bilirubin Total: <0.2 mg/dL (ref 0.0–1.2)
CO2: 23 mmol/L (ref 20–29)
Calcium: 9.6 mg/dL (ref 8.7–10.2)
Chloride: 102 mmol/L (ref 96–106)
Creatinine, Ser: 0.6 mg/dL (ref 0.57–1.00)
GFR calc Af Amer: 143 mL/min/{1.73_m2} (ref >59)
GFR calc non Af Amer: 124 mL/min/{1.73_m2} (ref >59)
Globulin, Total: 2.6 g/dL (ref 1.5–4.5)
Glucose: 102 mg/dL — ABNORMAL HIGH (ref 65–99)
Potassium: 3.7 mmol/L (ref 3.5–5.2)
Sodium: 138 mmol/L (ref 134–144)
Total Protein: 6.9 g/dL (ref 6.0–8.5)

## 2019-04-01 LAB — OBSTETRIC PANEL, INCLUDING HIV
Antibody Screen: NEGATIVE
Basophils Absolute: 0 10*3/uL (ref 0.0–0.2)
Basos: 0 %
EOS (ABSOLUTE): 0.1 10*3/uL (ref 0.0–0.4)
Eos: 2 %
HIV Screen 4th Generation wRfx: NONREACTIVE
Hematocrit: 38.5 % (ref 34.0–46.6)
Hemoglobin: 13.1 g/dL (ref 11.1–15.9)
Hepatitis B Surface Ag: NEGATIVE
Immature Grans (Abs): 0 10*3/uL (ref 0.0–0.1)
Immature Granulocytes: 0 %
Lymphocytes Absolute: 2.4 10*3/uL (ref 0.7–3.1)
Lymphs: 27 %
MCH: 28.1 pg (ref 26.6–33.0)
MCHC: 34 g/dL (ref 31.5–35.7)
MCV: 82 fL (ref 79–97)
Monocytes Absolute: 0.6 10*3/uL (ref 0.1–0.9)
Monocytes: 7 %
Neutrophils Absolute: 5.6 10*3/uL (ref 1.4–7.0)
Neutrophils: 64 %
Platelets: 235 10*3/uL (ref 150–450)
RBC: 4.67 x10E6/uL (ref 3.77–5.28)
RDW: 13 % (ref 11.7–15.4)
RPR Ser Ql: NONREACTIVE
Rh Factor: POSITIVE
Rubella Antibodies, IGG: 5.07 index (ref >0.99)
WBC: 8.8 10*3/uL (ref 3.4–10.8)

## 2019-04-01 LAB — TSH: TSH: 0.977 u[IU]/mL (ref 0.450–4.500)

## 2019-04-02 LAB — CULTURE, OB URINE

## 2019-04-02 LAB — URINE CULTURE, OB REFLEX

## 2019-04-08 ENCOUNTER — Encounter: Payer: Self-pay | Admitting: Advanced Practice Midwife

## 2019-04-14 ENCOUNTER — Ambulatory Visit (INDEPENDENT_AMBULATORY_CARE_PROVIDER_SITE_OTHER): Payer: 59 | Admitting: Advanced Practice Midwife

## 2019-04-14 ENCOUNTER — Other Ambulatory Visit: Payer: Self-pay

## 2019-04-14 VITALS — BP 109/78 | HR 86 | Wt 175.3 lb

## 2019-04-14 DIAGNOSIS — O468X1 Other antepartum hemorrhage, first trimester: Secondary | ICD-10-CM

## 2019-04-14 DIAGNOSIS — Z3A12 12 weeks gestation of pregnancy: Secondary | ICD-10-CM

## 2019-04-14 DIAGNOSIS — O418X10 Other specified disorders of amniotic fluid and membranes, first trimester, not applicable or unspecified: Secondary | ICD-10-CM

## 2019-04-14 DIAGNOSIS — E039 Hypothyroidism, unspecified: Secondary | ICD-10-CM

## 2019-04-14 DIAGNOSIS — Z3A19 19 weeks gestation of pregnancy: Secondary | ICD-10-CM

## 2019-04-14 DIAGNOSIS — O99281 Endocrine, nutritional and metabolic diseases complicating pregnancy, first trimester: Secondary | ICD-10-CM

## 2019-04-14 DIAGNOSIS — Z3400 Encounter for supervision of normal first pregnancy, unspecified trimester: Secondary | ICD-10-CM

## 2019-04-14 MED ORDER — BLOOD PRESSURE MONITORING KIT: 1.0000 | PACK | 0 refills | Status: DC

## 2019-04-14 NOTE — Patient Instructions (Signed)

## 2019-04-14 NOTE — Progress Notes (Signed)
Patient reports a little bit of pelvic pressure, denies pain today.

## 2019-04-14 NOTE — Progress Notes (Signed)
   PRENATAL VISIT NOTE  Subjective:  Sarah [name] is a 28 y.o. G1P0 at 60w6dbeing seen today for ongoing prenatal care.  She is currently monitored for the following issues for this low-risk pregnancy and has Encounter for supervision of low-risk first pregnancy, antepartum and Hypothyroidism affecting pregnancy in first trimester on their problem list.  Patient reports occasional cramping, improving in recent weeks, some nausea, also improved with no more vomiting..  Contractions: Not present. Vag. Bleeding: None.   . Denies leaking of fluid.   The following portions of the patient's history were reviewed and updated as appropriate: allergies, current medications, past family history, past medical history, past social history, past surgical history and problem list.   Objective:   Vitals:   04/14/19 1512  BP: 109/78  Pulse: 86  Weight: 175 lb 4.8 oz (79.5 kg)    Fetal Status: Fetal Heart Rate (bpm): 161         General:  Alert, oriented and cooperative. Patient is in no acute distress.  Skin: Skin is warm and dry. No rash noted.   Cardiovascular: Normal heart rate noted  Respiratory: Normal respiratory effort, no problems with respiration noted  Abdomen: Soft, gravid, appropriate for gestational age.  Pain/Pressure: Present     Pelvic: Cervical exam deferred        Extremities: Normal range of motion.  Edema: None  Mental Status: Normal mood and affect. Normal behavior. Normal judgment and thought content.   Assessment and Plan:  Pregnancy: G1P0 at 170w6d. Encounter for supervision of low-risk first pregnancy, antepartum --Anticipatory guidance about next visits/weeks of pregnancy given. --Reviewed safety, visitor policy, reassurance about COVID-19 for pregnancy at this time. Discussed possible changes to visits, including televisits, that may occur due to COVID-19.  The office remains open if pt needs to be seen and MAU is open 24 hours/day for OB emergencies. --Reviewed pt  normal labs from initial prenatal visit   - Blood Pressure Monitoring KIT; 1 kit by Does not apply route once a week.  Dispense: 1 kit; Refill: 0 - [place] OB DETAIL +14 WK; Future  2. Hypothyroid in pregnancy, antepartum, first trimester --Stable, normal TSH at last visit.  Check Q trimester   3. Subchorionic hemorrhage of placenta in first trimester, single or unspecified fetus --Spotting has resolved, but [facility] likely causing some of pt first trimester cramping.   --Improvement in cramping will likely continue --Notify office if worsening cramping or bleeding  Preterm labor symptoms and general obstetric precautions including but not limited to vaginal bleeding, contractions, leaking of fluid and fetal movement were reviewed in detail with the patient. Please refer to After Visit Summary for other counseling recommendations.   No follow-ups on file.  Future Appointments  Date Time Provider [place]8/02/2019  1:15 PM [place] City[place]  LiFatima BlankCNM

## 2019-04-16 ENCOUNTER — Other Ambulatory Visit: Payer: Self-pay

## 2019-04-16 MED ORDER — NITROFURANTOIN MONOHYD MACRO 100 MG PO CAPS: 100.0000 mg | ORAL_CAPSULE | Freq: Two times a day (BID) | ORAL | 1 refills | Status: DC

## 2019-04-16 NOTE — Progress Notes (Signed)
Pt sent Mychart message c/o dysuria.

## 2019-04-28 ENCOUNTER — Other Ambulatory Visit: Payer: Self-pay

## 2019-04-28 ENCOUNTER — Ambulatory Visit (INDEPENDENT_AMBULATORY_CARE_PROVIDER_SITE_OTHER): Payer: 59

## 2019-04-28 VITALS — BP 109/77 | HR 96 | Temp 98.9°F

## 2019-04-28 DIAGNOSIS — R300 Dysuria: Secondary | ICD-10-CM | POA: Diagnosis not present

## 2019-04-28 LAB — POCT URINALYSIS DIPSTICK
Bilirubin, UA: NEGATIVE
Blood, UA: NEGATIVE
Glucose, UA: NEGATIVE
Ketones, UA: NEGATIVE
Leukocytes, UA: NEGATIVE
Nitrite, UA: NEGATIVE
Protein, UA: NEGATIVE
Spec Grav, UA: 1.01 (ref 1.010–1.025)
Urobilinogen, UA: 0.2 E.U./dL
pH, UA: 7.5 (ref 5.0–8.0)

## 2019-04-28 NOTE — Progress Notes (Signed)
Pt is here with c/o dysuria. Pt reports that she just completed course of Macrobid and her symptoms have improved some but are not all the way better. Pt denies bleeding or cramping. Advised pt we will send her urine off for culture and let her know about the results. Pt verbalizes understanding.

## 2019-04-30 LAB — URINE CULTURE, OB REFLEX

## 2019-04-30 LAB — CULTURE, OB URINE

## 2019-05-07 ENCOUNTER — Encounter (HOSPITAL_BASED_OUTPATIENT_CLINIC_OR_DEPARTMENT_OTHER): Payer: Self-pay | Admitting: Emergency Medicine

## 2019-05-07 ENCOUNTER — Emergency Department (HOSPITAL_BASED_OUTPATIENT_CLINIC_OR_DEPARTMENT_OTHER): Payer: 59

## 2019-05-07 ENCOUNTER — Other Ambulatory Visit: Payer: Self-pay

## 2019-05-07 ENCOUNTER — Emergency Department (HOSPITAL_BASED_OUTPATIENT_CLINIC_OR_DEPARTMENT_OTHER)
Admission: EM | Admit: 2019-05-07 | Discharge: 2019-05-07 | Disposition: A | Discharge status: Home / Self Care | Payer: 59 | Attending: Emergency Medicine | Admitting: Emergency Medicine

## 2019-05-07 DIAGNOSIS — O99342 Other mental disorders complicating pregnancy, second trimester: Secondary | ICD-10-CM | POA: Insufficient documentation

## 2019-05-07 DIAGNOSIS — Z79899 Other long term (current) drug therapy: Secondary | ICD-10-CM | POA: Diagnosis not present

## 2019-05-07 DIAGNOSIS — F419 Anxiety disorder, unspecified: Secondary | ICD-10-CM | POA: Insufficient documentation

## 2019-05-07 DIAGNOSIS — Z3A16 16 weeks gestation of pregnancy: Secondary | ICD-10-CM | POA: Diagnosis not present

## 2019-05-07 DIAGNOSIS — O99612 Diseases of the digestive system complicating pregnancy, second trimester: Secondary | ICD-10-CM | POA: Insufficient documentation

## 2019-05-07 DIAGNOSIS — O9989 Other specified diseases and conditions complicating pregnancy, childbirth and the puerperium: Secondary | ICD-10-CM | POA: Diagnosis present

## 2019-05-07 DIAGNOSIS — K8020 Calculus of gallbladder without cholecystitis without obstruction: Secondary | ICD-10-CM | POA: Diagnosis not present

## 2019-05-07 DIAGNOSIS — R1011 Right upper quadrant pain: Secondary | ICD-10-CM

## 2019-05-07 LAB — URINALYSIS, ROUTINE W REFLEX MICROSCOPIC
Bilirubin Urine: NEGATIVE
Glucose, UA: NEGATIVE mg/dL
Hgb urine dipstick: NEGATIVE
Ketones, ur: NEGATIVE mg/dL
Leukocytes,Ua: NEGATIVE
Nitrite: NEGATIVE
Protein, ur: NEGATIVE mg/dL
Specific Gravity, Urine: 1.025 (ref 1.005–1.030)
pH: 6 (ref 5.0–8.0)

## 2019-05-07 LAB — COMPREHENSIVE METABOLIC PANEL
ALT: 48 U/L — ABNORMAL HIGH (ref 0–44)
AST: 55 U/L — ABNORMAL HIGH (ref 15–41)
Albumin: 3.7 g/dL (ref 3.5–5.0)
Alkaline Phosphatase: 50 U/L (ref 38–126)
Anion gap: 13 (ref 5–15)
BUN: 9 mg/dL (ref 6–20)
CO2: 21 mmol/L — ABNORMAL LOW (ref 22–32)
Calcium: 9.2 mg/dL (ref 8.9–10.3)
Chloride: 103 mmol/L (ref 98–111)
Creatinine, Ser: 0.64 mg/dL (ref 0.44–1.00)
GFR calc Af Amer: >60 mL/min (ref >60)
GFR calc non Af Amer: >60 mL/min (ref >60)
Glucose, Bld: 102 mg/dL — ABNORMAL HIGH (ref 70–99)
Potassium: 3.3 mmol/L — ABNORMAL LOW (ref 3.5–5.1)
Sodium: 137 mmol/L (ref 135–145)
Total Bilirubin: 0.5 mg/dL (ref 0.3–1.2)
Total Protein: 7 g/dL (ref 6.5–8.1)

## 2019-05-07 LAB — CBC WITH DIFFERENTIAL/PLATELET
Abs Immature Granulocytes: 0.04 10*3/uL (ref 0.00–0.07)
Basophils Absolute: 0 10*3/uL (ref 0.0–0.1)
Basophils Relative: 0 %
Eosinophils Absolute: 0.1 10*3/uL (ref 0.0–0.5)
Eosinophils Relative: 1 %
HCT: 38.2 % (ref 36.0–46.0)
Hemoglobin: 12.7 g/dL (ref 12.0–15.0)
Immature Granulocytes: 0 %
Lymphocytes Relative: 14 %
Lymphs Abs: 1.9 10*3/uL (ref 0.7–4.0)
MCH: 28.3 pg (ref 26.0–34.0)
MCHC: 33.2 g/dL (ref 30.0–36.0)
MCV: 85.1 fL (ref 80.0–100.0)
Monocytes Absolute: 0.8 10*3/uL (ref 0.1–1.0)
Monocytes Relative: 6 %
Neutro Abs: 10.2 10*3/uL — ABNORMAL HIGH (ref 1.7–7.7)
Neutrophils Relative %: 79 %
Platelets: 234 10*3/uL (ref 150–400)
RBC: 4.49 MIL/uL (ref 3.87–5.11)
RDW: 12.7 % (ref 11.5–15.5)
WBC: 13 10*3/uL — ABNORMAL HIGH (ref 4.0–10.5)
nRBC: 0 % (ref 0.0–0.2)

## 2019-05-07 LAB — LIPASE, BLOOD: Lipase: 24 U/L (ref 11–51)

## 2019-05-07 MED ORDER — FAMOTIDINE IN NACL 20-0.9 MG/50ML-% IV SOLN
20.0000 mg | Freq: Once | INTRAVENOUS | Status: AC
  Administered 2019-05-07: 20 mg via INTRAVENOUS
  Filled 2019-05-07: qty 50

## 2019-05-07 MED ORDER — HYDROCODONE-ACETAMINOPHEN 5-325 MG PO TABS: 1.0000 | ORAL_TABLET | ORAL | 0 refills | Status: DC | PRN

## 2019-05-07 NOTE — ED Notes (Signed)
Pt verbalized understanding of dc instructions.

## 2019-05-07 NOTE — ED Triage Notes (Signed)
Patient presents with complaints of upper to mid back pain and mid sternal chest pain with shortness of breath; states this pain woke her from sleeping tonight. States [redacted] week gestation as well.

## 2019-05-07 NOTE — ED Provider Notes (Signed)
Care transferred to me.  Ultrasound confirms cholelithiasis.  However no cholecystitis.  Pain is minimal, around 2/10.  No vomiting.  No fevers.  We discussed return precautions but this point she appears amenable to outpatient follow-up with general surgery.  She is okay with hydrocodone for breakthrough pain.  Otherwise we discussed tricked return precautions, some foods to avoid, and follow-up.  Results for orders placed or performed during the hospital encounter of 05/07/19  CBC with Differential  Result Value Ref Range   WBC 13.0 (H) 4.0 - 10.5 K/uL   RBC 4.49 3.87 - 5.11 MIL/uL   Hemoglobin 12.7 12.0 - 15.0 g/dL   HCT 38.2 36.0 - 46.0 %   MCV 85.1 80.0 - 100.0 fL   MCH 28.3 26.0 - 34.0 pg   MCHC 33.2 30.0 - 36.0 g/dL   RDW 12.7 11.5 - 15.5 %   Platelets 234 150 - 400 K/uL   nRBC 0.0 0.0 - 0.2 %   Neutrophils Relative % 79 %   Neutro Abs 10.2 (H) 1.7 - 7.7 K/uL   Lymphocytes Relative 14 %   Lymphs Abs 1.9 0.7 - 4.0 K/uL   Monocytes Relative 6 %   Monocytes Absolute 0.8 0.1 - 1.0 K/uL   Eosinophils Relative 1 %   Eosinophils Absolute 0.1 0.0 - 0.5 K/uL   Basophils Relative 0 %   Basophils Absolute 0.0 0.0 - 0.1 K/uL   Immature Granulocytes 0 %   Abs Immature Granulocytes 0.04 0.00 - 0.07 K/uL  Comprehensive metabolic panel  Result Value Ref Range   Sodium 137 135 - 145 mmol/L   Potassium 3.3 (L) 3.5 - 5.1 mmol/L   Chloride 103 98 - 111 mmol/L   CO2 21 (L) 22 - 32 mmol/L   Glucose, Bld 102 (H) 70 - 99 mg/dL   BUN 9 6 - 20 mg/dL   Creatinine, Ser 0.64 0.44 - 1.00 mg/dL   Calcium 9.2 8.9 - 10.3 mg/dL   Total Protein 7.0 6.5 - 8.1 g/dL   Albumin 3.7 3.5 - 5.0 g/dL   AST 55 (H) 15 - 41 U/L   ALT 48 (H) 0 - 44 U/L   Alkaline Phosphatase 50 38 - 126 U/L   Total Bilirubin 0.5 0.3 - 1.2 mg/dL   GFR calc non Af Amer >60 >60 mL/min   GFR calc Af Amer >60 >60 mL/min   Anion gap 13 5 - 15  Lipase, blood  Result Value Ref Range   Lipase 24 11 - 51 U/L  Urinalysis, Routine w  reflex microscopic  Result Value Ref Range   Color, Urine YELLOW YELLOW   APPearance HAZY (A) CLEAR   Specific Gravity, Urine 1.025 1.005 - 1.030   pH 6.0 5.0 - 8.0   Glucose, UA NEGATIVE NEGATIVE mg/dL   Hgb urine dipstick NEGATIVE NEGATIVE   Bilirubin Urine NEGATIVE NEGATIVE   Ketones, ur NEGATIVE NEGATIVE mg/dL   Protein, ur NEGATIVE NEGATIVE mg/dL   Nitrite NEGATIVE NEGATIVE   Leukocytes,Ua NEGATIVE NEGATIVE   US Abdomen Limited Ruq  Result Date: 05/07/2019 CLINICAL DATA:  Upper abdominal pain, nausea and shortness of breath. EXAM: ULTRASOUND ABDOMEN LIMITED RIGHT UPPER QUADRANT COMPARISON:  None. FINDINGS: Gallbladder: Small amount of sludge and a 7 mm mobile gallstone. No gallbladder wall thickening visualized. No sonographic Murphy sign noted by sonographer. Common bile duct: Diameter: 4.5 mm Liver: No focal lesion identified. Within normal limits in parenchymal echogenicity. Portal vein is patent on color Doppler imaging with normal direction  of blood flow towards the liver. IMPRESSION: Cholelithiasis without evidence of acute cholecystitis. Normal appearance of the liver. Electronically Signed   By: Ted Mcalpineobrinka  Dimitrova M.D.   On: 05/07/2019 09:11      Pricilla LovelessGoldston, Sarah Klinke, MD 05/07/19 [id number]

## 2019-05-07 NOTE — ED Provider Notes (Signed)
[facility] DEPT Provider Note: Georgena Spurling, MD, FACEP  CSN: [id number] MRN: [id number] ARRIVAL: 05/07/19 at [place]: MHOTF/OTF   CHIEF COMPLAINT  Chest Pain   HISTORY OF PRESENT ILLNESS  05/07/19 4:36 AM Sarah [name] is a 28 y.o. female who is about [redacted] weeks pregnant, G1P0.  She has had good prenatal care.  She is here with mid back pain that awakened her from sleep about 2 AM.  The pain is also felt in her lower substernal and epigastric area.  She describes it as a squeezing pain and rates it as a 5 out of 10.  She is not sure if it is worse with a deep breath but admits she is taking shallow breaths.  She feels somewhat short of breath as a result of the pain.  She has some nausea but not substantially different than her pregnancy associated nausea.  She denies vomiting or diarrhea.  She has not had a fever, chills, cough, nasal congestion or sore throat.    Past Medical History:  Diagnosis Date  . Anxiety   . Bell's palsy   . Ovarian cyst   . Thyroid disease     History reviewed. No pertinent surgical history.  Family History  Problem Relation Age of Onset  . Hypertension Mother   . Hashimoto's thyroiditis Mother   . Cancer Maternal Grandmother        bone marrow cancer    Social History   Tobacco Use  . Smoking status: Never Smoker  . Smokeless tobacco: Never Used  Substance Use Topics  . Alcohol use: Yes    Comment: socially  . Drug use: Never    Prior to Admission medications   Medication Sig Start Date End Date Taking? Authorizing Provider  Blood Pressure KIT Monitor BP readings at home regularly z34.90 large size 03/31/19   Leftwich-Kirby, Lisa A, CNM  Blood Pressure Monitoring KIT 1 kit by Does not apply route once a week. 04/14/19   Leftwich-Kirby, Kathie Dike, CNM  Doxylamine-Pyridoxine (DICLEGIS) 10-10 MG TBEC Take 2 tabs at bedtime. If needed, add another tab in the morning. If needed, add another tab in the afternoon, up to 4 tabs/day. Patient  not taking: Reported on 03/31/2019 03/12/19   Leftwich-Kirby, Kathie Dike, CNM  HYDROcodone-acetaminophen (NORCO) 5-325 MG tablet Take 1 tablet by mouth every 4 (four) hours as needed. 05/07/19   Sherwood Gambler, MD  metoCLOPramide (REGLAN) 10 MG tablet Take 1 tablet (10 mg total) by mouth every 6 (six) hours as needed for nausea. Patient not taking: Reported on 03/31/2019 03/18/19   Leftwich-Kirby, Kathie Dike, CNM  nitrofurantoin, macrocrystal-monohydrate, (MACROBID) 100 MG capsule Take 1 capsule (100 mg total) by mouth 2 (two) times daily. 04/16/19   Shelly Bombard, MD  NP THYROID 30 MG tablet Take 1 tablet (30 mg total) by mouth daily before breakfast. 12/31/18   Cirigliano, Garvin Fila, DO  Prenat w/o A Vit-FeFum-FePo-FA (CONCEPT OB) 130-92.4-1 MG CAPS Take 1 capsule by mouth daily. 02/17/19   Leftwich-Kirby, Kathie Dike, CNM  promethazine (PHENERGAN) 25 MG tablet Take 0.5-1 tablets (12.5-25 mg total) by mouth every 6 (six) hours as needed for nausea. 03/31/19   Leftwich-Kirby, Kathie Dike, CNM  terconazole (TERAZOL 7) 0.4 % vaginal cream Place 1 applicator vaginally at bedtime. 03/26/19   Julianne Handler, CNM    Allergies Azithromycin and Penicillins   REVIEW OF SYSTEMS  Negative except as noted here or in the History of Present Illness.   PHYSICAL EXAMINATION  Initial  Vital Signs Blood pressure 124/80, pulse 100, temperature 98 F (36.7 C), temperature source Oral, resp. rate 20, height _0  (1.626 m), weight 79 kg, last menstrual period 01/14/2019, SpO2 100 %.  Examination General: Well-developed, well-nourished female in no acute distress; appearance consistent with age of record HENT: normocephalic; atraumatic Eyes: pupils equal, round and reactive to light; extraocular muscles intact Neck: supple Heart: regular rate and rhythm Lungs: clear to auscultation bilaterally Abdomen: soft; nondistended; epigastric and right upper quadrant tenderness with positive Murphy sign; gravid, consistent with dates, FHTs  140s; bowel sounds present Extremities: No deformity; full range of motion; pulses normal Neurologic: Awake, alert and oriented; motor function intact in all extremities and symmetric; no facial droop Skin: Warm and dry Psychiatric: Normal mood and affect   RESULTS  Summary of this visit's results, reviewed by myself:   EKG Interpretation  Date/Time:  Thursday May 07 2019 04:39:51 EDT Ventricular Rate:  94 PR Interval:    QRS Duration: 86 QT Interval:  365 QTC Calculation: 457 R Axis:   76 Text Interpretation:  Sinus rhythm Normal ECG No previous ECGs available Confirmed by Kealy Lewter, Jenny Reichmann [phone]) on 05/07/2019 5:03:44 AM      Laboratory Studies: Results for orders placed or performed during the hospital encounter of 05/07/19 (from the past 24 hour(s))  CBC with Differential     Status: Abnormal   Collection Time: 05/07/19  4:51 AM  Result Value Ref Range   WBC 13.0 (H) 4.0 - 10.5 K/uL   RBC 4.49 3.87 - 5.11 MIL/uL   Hemoglobin 12.7 12.0 - 15.0 g/dL   HCT 38.2 36.0 - 46.0 %   MCV 85.1 80.0 - 100.0 fL   MCH 28.3 26.0 - 34.0 pg   MCHC 33.2 30.0 - 36.0 g/dL   RDW 12.7 11.5 - 15.5 %   Platelets 234 150 - 400 K/uL   nRBC 0.0 0.0 - 0.2 %   Neutrophils Relative % 79 %   Neutro Abs 10.2 (H) 1.7 - 7.7 K/uL   Lymphocytes Relative 14 %   Lymphs Abs 1.9 0.7 - 4.0 K/uL   Monocytes Relative 6 %   Monocytes Absolute 0.8 0.1 - 1.0 K/uL   Eosinophils Relative 1 %   Eosinophils Absolute 0.1 0.0 - 0.5 K/uL   Basophils Relative 0 %   Basophils Absolute 0.0 0.0 - 0.1 K/uL   Immature Granulocytes 0 %   Abs Immature Granulocytes 0.04 0.00 - 0.07 K/uL  Comprehensive metabolic panel     Status: Abnormal   Collection Time: 05/07/19  4:51 AM  Result Value Ref Range   Sodium 137 135 - 145 mmol/L   Potassium 3.3 (L) 3.5 - 5.1 mmol/L   Chloride 103 98 - 111 mmol/L   CO2 21 (L) 22 - 32 mmol/L   Glucose, Bld 102 (H) 70 - 99 mg/dL   BUN 9 6 - 20 mg/dL   Creatinine, Ser 0.64 0.44 - 1.00 mg/dL    Calcium 9.2 8.9 - 10.3 mg/dL   Total Protein 7.0 6.5 - 8.1 g/dL   Albumin 3.7 3.5 - 5.0 g/dL   AST 55 (H) 15 - 41 U/L   ALT 48 (H) 0 - 44 U/L   Alkaline Phosphatase 50 38 - 126 U/L   Total Bilirubin 0.5 0.3 - 1.2 mg/dL   GFR calc non Af Amer >60 >60 mL/min   GFR calc Af Amer >60 >60 mL/min   Anion gap 13 5 - 15  Lipase, blood  Status: None   Collection Time: 05/07/19  4:51 AM  Result Value Ref Range   Lipase 24 11 - 51 U/L  Urinalysis, Routine w reflex microscopic     Status: Abnormal   Collection Time: 05/07/19  5:42 AM  Result Value Ref Range   Color, Urine YELLOW YELLOW   APPearance HAZY (A) CLEAR   Specific Gravity, Urine 1.025 1.005 - 1.030   pH 6.0 5.0 - 8.0   Glucose, UA NEGATIVE NEGATIVE mg/dL   Hgb urine dipstick NEGATIVE NEGATIVE   Bilirubin Urine NEGATIVE NEGATIVE   Ketones, ur NEGATIVE NEGATIVE mg/dL   Protein, ur NEGATIVE NEGATIVE mg/dL   Nitrite NEGATIVE NEGATIVE   Leukocytes,Ua NEGATIVE NEGATIVE   Imaging Studies: US Abdomen Limited Ruq  Result Date: 05/07/2019 CLINICAL DATA:  Upper abdominal pain, nausea and shortness of breath. EXAM: ULTRASOUND ABDOMEN LIMITED RIGHT UPPER QUADRANT COMPARISON:  None. FINDINGS: Gallbladder: Small amount of sludge and a 7 mm mobile gallstone. No gallbladder wall thickening visualized. No sonographic Murphy sign noted by sonographer. Common bile duct: Diameter: 4.5 mm Liver: No focal lesion identified. Within normal limits in parenchymal echogenicity. Portal vein is patent on color Doppler imaging with normal direction of blood flow towards the liver. IMPRESSION: Cholelithiasis without evidence of acute cholecystitis. Normal appearance of the liver. Electronically Signed   By: Fidela Salisbury M.D.   On: 05/07/2019 09:11    ED COURSE and MDM  Nursing notes and initial vitals signs, including pulse oximetry, reviewed.  Vitals:   05/07/19 0545 05/07/19 0712 05/07/19 0815 05/07/19 0934  BP:  108/64  109/73  Pulse: 87 82 84  90  Resp: _0 Temp:      TempSrc:      SpO2: 98% 100% 99% 99%  Weight:      Height:       5:51 AM Pain somewhat improved without analgesics.  Less epigastric tenderness but right upper quadrant tenderness persists.  Biliary ultrasound pending.  7:10 AM Signed out to Dr. Regenia Skeeter.  PROCEDURES    ED DIAGNOSES     ICD-10-CM   1. Calculus of gallbladder without cholecystitis without obstruction  K80.20   2. RUQ abdominal pain  R10.11 US Abdomen Limited RUQ    US Abdomen Limited RUQ       Rishab Stoudt, MD 05/07/19 2241

## 2019-05-07 NOTE — ED Notes (Signed)
Patient transported to Ultrasound 

## 2019-05-07 NOTE — Discharge Instructions (Addendum)
Your ultrasound shows a stone in your gallbladder.  If you develop fever, vomiting, uncontrolled pain, or any other new/concerning symptoms then return to the ER for evaluation.  Otherwise follow-up with general surgery.

## 2019-05-11 ENCOUNTER — Other Ambulatory Visit: Payer: Self-pay | Admitting: Advanced Practice Midwife

## 2019-05-11 DIAGNOSIS — O99612 Diseases of the digestive system complicating pregnancy, second trimester: Secondary | ICD-10-CM

## 2019-05-11 DIAGNOSIS — K8020 Calculus of gallbladder without cholecystitis without obstruction: Secondary | ICD-10-CM

## 2019-05-11 NOTE — Progress Notes (Signed)
Referral to general surgery for pt for cholecystolithiasis in pregnancy.  MyChart message to pt to follow strict low fat diet for symptom management.

## 2019-05-26 ENCOUNTER — Telehealth: Payer: Self-pay

## 2019-05-26 NOTE — Telephone Encounter (Signed)
Covid-19 screening questions   Do you now or have you had a fever in the last 14 days?  Do you have any respiratory symptoms of shortness of breath or cough now or in the last 14 days?  Do you have any family members or close contacts with diagnosed or suspected Covid-19 in the past 14 days?  Have you been tested for Covid-19 and found to be positive?   Called and left voicemail for patient to call us back to answer questions

## 2019-05-27 ENCOUNTER — Ambulatory Visit (INDEPENDENT_AMBULATORY_CARE_PROVIDER_SITE_OTHER): Payer: 59 | Admitting: Certified Nurse Midwife

## 2019-05-27 ENCOUNTER — Other Ambulatory Visit: Payer: Self-pay

## 2019-05-27 ENCOUNTER — Other Ambulatory Visit (HOSPITAL_COMMUNITY): Payer: Self-pay | Admitting: *Deleted

## 2019-05-27 ENCOUNTER — Encounter: Payer: Self-pay | Admitting: Certified Nurse Midwife

## 2019-05-27 ENCOUNTER — Ambulatory Visit (HOSPITAL_COMMUNITY)
Admission: RE | Admit: 2019-05-27 | Discharge: 2019-05-27 | Disposition: A | Discharge status: Home / Self Care | Payer: 59 | Source: Ambulatory Visit | Attending: Obstetrics and Gynecology | Admitting: Obstetrics and Gynecology

## 2019-05-27 VITALS — BP 108/72 | HR 80 | Wt 174.0 lb

## 2019-05-27 DIAGNOSIS — E039 Hypothyroidism, unspecified: Secondary | ICD-10-CM

## 2019-05-27 DIAGNOSIS — Z3A19 19 weeks gestation of pregnancy: Secondary | ICD-10-CM

## 2019-05-27 DIAGNOSIS — O99282 Endocrine, nutritional and metabolic diseases complicating pregnancy, second trimester: Secondary | ICD-10-CM

## 2019-05-27 DIAGNOSIS — Z3400 Encounter for supervision of normal first pregnancy, unspecified trimester: Secondary | ICD-10-CM

## 2019-05-27 DIAGNOSIS — O99280 Endocrine, nutritional and metabolic diseases complicating pregnancy, unspecified trimester: Secondary | ICD-10-CM

## 2019-05-27 DIAGNOSIS — Z363 Encounter for antenatal screening for malformations: Secondary | ICD-10-CM

## 2019-05-27 NOTE — Progress Notes (Signed)
   PRENATAL VISIT NOTE  Subjective:  Sarah [name] is a 28 y.o. G1P0 at [redacted]w[redacted]d being seen today for ongoing prenatal care. Pt has no complaints today. Was seen in ED last month for gallstones and had consultation with general surgery. Pt has no complaints today and reports improvement of pain with low fat diet. She is currently monitored for the following issues for this low-risk pregnancy and has Encounter for supervision of low-risk first pregnancy, antepartum and Hypothyroidism affecting pregnancy in first trimester on their problem list.  Patient reports no complaints.  Contractions: Not present.  feeling occasional flutters . Denies leaking of fluid, vaginal bleeding or vaginal discharge.   The following portions of the patient's history were reviewed and updated as appropriate: allergies, current medications, past family history, past medical history, past social history, past surgical history and problem list.   Objective:   Vitals:   05/27/19 1120  BP: 108/72  Pulse: 80  Weight: 78.9 kg    Fetal Status: Fetal Heart Rate (bpm): 138   Movement: Present     General:  Alert, oriented and cooperative. Patient is in no acute distress.  Skin: Skin is warm and dry. No rash noted.   Cardiovascular: Normal heart rate noted  Respiratory: Normal respiratory effort, no problems with respiration noted  Abdomen: Soft, gravid, appropriate for gestational age.  Pain/Pressure: Absent     Pelvic: Cervical exam deferred        Extremities: Normal range of motion.  Edema: None  Mental Status: Normal mood and affect. Normal behavior. Normal judgment and thought content.   Assessment and Plan:  Pregnancy: G1P0 at [redacted]w[redacted]d 1. Encounter for supervision of low-risk first pregnancy, antepartum - Patient has no complaints - AFP today - Has anatomy u/s scheduled for today  - 2hr GTT at next appointment  - AFP, Serum, Open Spina Bifida  2. Hypothyroidism affecting pregnancy in first trimester -  Will draw TSH level today  - TSH  Preterm labor symptoms and general obstetric precautions including but not limited to vaginal bleeding, contractions, leaking of fluid and fetal movement were reviewed in detail with the patient.  Reminded patient to take BP weekly and log into Babyscripts  Please refer to After Visit Summary for other counseling recommendations.   Return in about 8 weeks (around 07/22/2019) for ROB/2hrGTT.  Future Appointments  Date Time Provider [place]  05/27/2019  1:15 PM [facility] [place], Student-MidWife

## 2019-05-27 NOTE — Patient Instructions (Signed)
Glucose Tolerance Test During Pregnancy Why am I having this test? The glucose tolerance test (GTT) is done to check how your body processes sugar (glucose). This is one of several tests used to diagnose diabetes that develops during pregnancy (gestational diabetes mellitus). Gestational diabetes is a temporary form of diabetes that some women develop during pregnancy. It usually occurs during the second trimester of pregnancy and goes away after delivery. Testing (screening) for gestational diabetes usually occurs between 24 and 28 weeks of pregnancy. You may have the GTT test after having a 1-hour glucose screening test if the results from that test indicate that you may have gestational diabetes. You may also have this test if:  You have a history of gestational diabetes.  You have a history of giving birth to very large babies or have experienced repeated fetal loss (stillbirth).  You have signs and symptoms of diabetes, such as: ? Changes in your vision. ? Tingling or numbness in your hands or feet. ? Changes in hunger, thirst, and urination that are not otherwise explained by your pregnancy. What is being tested? This test measures the amount of glucose in your blood at different times during a period of 3 hours. This indicates how well your body is able to process glucose. What kind of sample is taken?  Blood samples are required for this test. They are usually collected by inserting a needle into a blood vessel. How do I prepare for this test?  For 3 days before your test, eat normally. Have plenty of carbohydrate-rich foods.  Follow instructions from your health care provider about: ? Eating or drinking restrictions on the day of the test. You may be asked to not eat or drink anything other than water (fast) starting 8-10 hours before the test. ? Changing or stopping your regular medicines. Some medicines may interfere with this test. Tell a health care provider about:  All  medicines you are taking, including vitamins, herbs, eye drops, creams, and over-the-counter medicines.  Any blood disorders you have.  Any surgeries you have had.  Any medical conditions you have. What happens during the test? First, your blood glucose will be measured. This is referred to as your fasting blood glucose, since you fasted before the test. Then, you will drink a glucose solution that contains a certain amount of glucose. Your blood glucose will be measured again 1, 2, and 3 hours after drinking the solution. This test takes about 3 hours to complete. You will need to stay at the testing location during this time. During the testing period:  Do not eat or drink anything other than the glucose solution.  Do not exercise.  Do not use any products that contain nicotine or tobacco, such as cigarettes and e-cigarettes. If you need help stopping, ask your health care provider. The testing procedure may vary among health care providers and hospitals. How are the results reported? Your results will be reported as milligrams of glucose per deciliter of blood (mg/dL) or millimoles per liter (mmol/L). Your health care provider will compare your results to normal ranges that were established after testing a large group of people (reference ranges). Reference ranges may vary among labs and hospitals. For this test, common reference ranges are:  Fasting: less than 95-105 mg/dL (5.3-5.8 mmol/L).  1 hour after drinking glucose: less than 180-190 mg/dL (10.0-10.5 mmol/L).  2 hours after drinking glucose: less than 155-165 mg/dL (8.6-9.2 mmol/L).  3 hours after drinking glucose: 140-145 mg/dL (7.8-8.1 mmol/L). What do the   results mean? Results within reference ranges are considered normal, meaning that your glucose levels are well-controlled. If two or more of your blood glucose levels are high, you may be diagnosed with gestational diabetes. If only one level is high, your health care  provider may suggest repeat testing or other tests to confirm a diagnosis. Talk with your health care provider about what your results mean. Questions to ask your health care provider Ask your health care provider, or the department that is doing the test:  When will my results be ready?  How will I get my results?  What are my treatment options?  What other tests do I need?  What are my next steps? Summary  The glucose tolerance test (GTT) is one of several tests used to diagnose diabetes that develops during pregnancy (gestational diabetes mellitus). Gestational diabetes is a temporary form of diabetes that some women develop during pregnancy.  You may have the GTT test after having a 1-hour glucose screening test if the results from that test indicate that you may have gestational diabetes. You may also have this test if you have any symptoms or risk factors for gestational diabetes.  Talk with your health care provider about what your results mean. This information is not intended to replace advice given to you by your health care provider. Make sure you discuss any questions you have with your health care provider. Document Released: 04/08/2012 Document Revised: 01/29/2019 Document Reviewed: 05/20/2017 Elsevier Patient Education  2020 [employer]   Second Trimester of Pregnancy  The second trimester is from week 14 through week 27 (month 4 through 6). This is often the time in pregnancy that you feel your best. Often times, morning sickness has lessened or quit. You may have more energy, and you may get hungry more often. Your unborn baby is growing rapidly. At the end of the sixth month, he or she is about 9 inches long and weighs about 1 pounds. You will likely feel the baby move between 18 and 20 weeks of pregnancy. Follow these instructions at home: Medicines  Take over-the-counter and prescription medicines only as told by your doctor. Some medicines are safe and some  medicines are not safe during pregnancy.  Take a prenatal vitamin that contains at least 600 micrograms (mcg) of folic acid.  If you have trouble pooping (constipation), take medicine that will make your stool soft (stool softener) if your doctor approves. Eating and drinking   Eat regular, healthy meals.  Avoid raw meat and uncooked cheese.  If you get low calcium from the food you eat, talk to your doctor about taking a daily calcium supplement.  Avoid foods that are high in fat and sugars, such as fried and sweet foods.  If you feel sick to your stomach (nauseous) or throw up (vomit): ? Eat 4 or 5 small meals a day instead of 3 large meals. ? Try eating a few soda crackers. ? Drink liquids between meals instead of during meals.  To prevent constipation: ? Eat foods that are high in fiber, like fresh fruits and vegetables, whole grains, and beans. ? Drink enough fluids to keep your pee (urine) clear or pale yellow. Activity  Exercise only as told by your doctor. Stop exercising if you start to have cramps.  Do not exercise if it is too hot, too humid, or if you are in a place of great height (high altitude).  Avoid heavy lifting.  Wear low-heeled shoes. Sit and stand up   straight.  You can continue to have sex unless your doctor tells you not to. Relieving pain and discomfort  Wear a good support bra if your breasts are tender.  Take warm water baths (sitz baths) to soothe pain or discomfort caused by hemorrhoids. Use hemorrhoid cream if your doctor approves.  Rest with your legs raised if you have leg cramps or low back pain.  If you develop puffy, bulging veins (varicose veins) in your legs: ? Wear support hose or compression stockings as told by your doctor. ? Raise (elevate) your feet for 15 minutes, 3-4 times a day. ? Limit salt in your food. Prenatal care  Write down your questions. Take them to your prenatal visits.  Keep all your prenatal visits as told by  your doctor. This is important. Safety  Wear your seat belt when driving.  Make a list of emergency phone numbers, including numbers for family, friends, the hospital, and police and fire departments. General instructions  Ask your doctor about the right foods to eat or for help finding a counselor, if you need these services.  Ask your doctor about local prenatal classes. Begin classes before month 6 of your pregnancy.  Do not use hot tubs, steam rooms, or saunas.  Do not douche or use tampons or scented sanitary pads.  Do not cross your legs for long periods of time.  Visit your dentist if you have not done so. Use a soft toothbrush to brush your teeth. Floss gently.  Avoid all smoking, herbs, and alcohol. Avoid drugs that are not approved by your doctor.  Do not use any products that contain nicotine or tobacco, such as cigarettes and e-cigarettes. If you need help quitting, ask your doctor.  Avoid cat litter boxes and soil used by cats. These carry germs that can cause birth defects in the baby and can cause a loss of your baby (miscarriage) or stillbirth. Contact a doctor if:  You have mild cramps or pressure in your lower belly.  You have pain when you pee (urinate).  You have bad smelling fluid coming from your vagina.  You continue to feel sick to your stomach (nauseous), throw up (vomit), or have watery poop (diarrhea).  You have a nagging pain in your belly area.  You feel dizzy. Get help right away if:  You have a fever.  You are leaking fluid from your vagina.  You have spotting or bleeding from your vagina.  You have severe belly cramping or pain.  You lose or gain weight rapidly.  You have trouble catching your breath and have chest pain.  You notice sudden or extreme puffiness (swelling) of your face, hands, ankles, feet, or legs.  You have not felt the baby move in over an hour.  You have severe headaches that do not go away when you take  medicine.  You have trouble seeing. Summary  The second trimester is from week 14 through week 27 (months 4 through 6). This is often the time in pregnancy that you feel your best.  To take care of yourself and your unborn baby, you will need to eat healthy meals, take medicines only if your doctor tells you to do so, and do activities that are safe for you and your baby.  Call your doctor if you get sick or if you notice anything unusual about your pregnancy. Also, call your doctor if you need help with the right food to eat, or if you want to know what activities  are safe for you. This information is not intended to replace advice given to you by your health care provider. Make sure you discuss any questions you have with your health care provider. Document Released: 01/02/2010 Document Revised: 01/30/2019 Document Reviewed: 11/13/2016 Elsevier Patient Education  2020 [employer].

## 2019-05-28 ENCOUNTER — Ambulatory Visit: Payer: 59 | Admitting: Gastroenterology

## 2019-05-29 LAB — AFP, SERUM, OPEN SPINA BIFIDA
AFP MoM: 0.63
AFP Value: 27.9 ng/mL
Gest. Age on Collection Date: 19 weeks
Maternal Age At EDD: 29.4 yr
OSBR Risk 1 IN: 10000
Test Results:: NEGATIVE
Weight: 174 [lb_av]

## 2019-05-29 LAB — TSH: TSH: 1.52 u[IU]/mL (ref 0.450–4.500)

## 2019-06-07 ENCOUNTER — Other Ambulatory Visit: Payer: Self-pay | Admitting: Advanced Practice Midwife

## 2019-06-07 MED ORDER — TRAMADOL HCL 50 MG PO TABS: 50.0000 mg | ORAL_TABLET | Freq: Four times a day (QID) | ORAL | 0 refills | Status: DC | PRN

## 2019-06-07 NOTE — Progress Notes (Signed)
Pt with pain with gallstones, had another attack recently. Tylenol is no enough for pain when it is this severe.  Rx for Tramadol 50-100 mg Q 6 hours PRN x 20 tabs sent to pharmacy.

## 2019-06-10 ENCOUNTER — Other Ambulatory Visit: Payer: Self-pay | Admitting: Advanced Practice Midwife

## 2019-06-10 MED ORDER — TRAMADOL HCL 50 MG PO TABS: 50.0000 mg | ORAL_TABLET | Freq: Four times a day (QID) | ORAL | 0 refills | Status: DC | PRN

## 2019-06-10 NOTE — Progress Notes (Signed)
Pt sent MyChart message that she had moved and needed her medication sent to new pharmacy. Confirmed with [facility] on [address] that medication was never filled and prescription cancelled.  New Rx for Tramadol 50-100 mg Q 6 hours PRN x 20 tabs sent to [facility] on [address]

## 2019-07-22 ENCOUNTER — Other Ambulatory Visit: Payer: 59

## 2019-07-22 ENCOUNTER — Encounter: Payer: 59 | Admitting: Obstetrics and Gynecology

## 2019-07-24 ENCOUNTER — Other Ambulatory Visit: Payer: Self-pay

## 2019-07-24 ENCOUNTER — Ambulatory Visit (INDEPENDENT_AMBULATORY_CARE_PROVIDER_SITE_OTHER): Payer: 59 | Admitting: Women's Health

## 2019-07-24 ENCOUNTER — Other Ambulatory Visit: Payer: 59

## 2019-07-24 VITALS — BP 110/71 | HR 75 | Wt 174.0 lb

## 2019-07-24 DIAGNOSIS — K8020 Calculus of gallbladder without cholecystitis without obstruction: Secondary | ICD-10-CM

## 2019-07-24 DIAGNOSIS — Z3400 Encounter for supervision of normal first pregnancy, unspecified trimester: Secondary | ICD-10-CM

## 2019-07-24 DIAGNOSIS — Z23 Encounter for immunization: Secondary | ICD-10-CM | POA: Diagnosis not present

## 2019-07-24 DIAGNOSIS — E039 Hypothyroidism, unspecified: Secondary | ICD-10-CM

## 2019-07-24 DIAGNOSIS — Z3A27 27 weeks gestation of pregnancy: Secondary | ICD-10-CM

## 2019-07-24 DIAGNOSIS — O99282 Endocrine, nutritional and metabolic diseases complicating pregnancy, second trimester: Secondary | ICD-10-CM

## 2019-07-24 NOTE — Patient Instructions (Addendum)
Pregnancy and Hypothyroidism Hypothyroidism is a condition that develops if your thyroid has low activity. The thyroid is a small, butterfly-shaped gland in your neck. It is located in front of your windpipe. It makes hormones that play an important role in regulating your breathing, heart rate, menstrual cycle, body temperature, and other bodily functions. If you have hypothyroidism, your thyroid gland does not produce enough thyroid hormones. When you are pregnant, your body uses more thyroid hormones. This can cause mild hypothyroidism to get worse. How does this affect me? Hypothyroidism during pregnancy can cause you to have:  Fatigue.  Abnormal weight gain. For women of normal weight, it is common to gain about 1 pound per week during pregnancy.  Difficulty having a bowel movement (constipation).  Feeling cold more often than others do.  Muscle aches.  Pregnancy complications, such as: ? High blood pressure that develops after the 20th week of pregnancy (preeclampsia). ? Pregnancy loss (miscarriage). ? Preterm birth. ? Placenta problems. How does this affect my baby? Hypothyroidism can also affect your baby. Babies need thyroid hormone from their mothers for normal growth and brain development. Babies born to mothers with hypothyroidism during pregnancy may:  Be born prematurely.  Have low birth weight.  Have mental delays.  May develop hypothyroidism. This is rare. What can I do to lower my risk? Some women with hypothyroidism need extra iodine during pregnancy. Your health care provider may recommend that you:  Eat foods with iodine, such as: ? Iodized salt. ? Pasteurized eggs and dairy products. ? Low-mercury seafood.  Take a prenatal vitamin that contains iodine.  Take iodine supplements. How is this treated? Treatment may include:  Monitoring. If you have mild hypothyroidism, your health care provider will monitor your thyroid hormone levels closely to watch  for any changes.  Medicines. Your health care provider may prescribe medicine to control your thyroid hormone levels. Follow these instructions at home:  Take over-the-counter and prescription medicines only as told by your health care provider. ? Check with your health care provider before taking any hypothyroid medicines that were prescribed before you became pregnant. Many are safe, but some treatments for hypothyroidism may have to be stopped during pregnancy.  You may be asked to perform kick counts to monitor your baby's movements. If your baby moves fewer than 10 times in 2 hours during a period when the baby is usually active (typically in the evening), you should see your health care provider right away.  Take a prenatal vitamin as told by your health care provider.  Keep all follow-up visits. This is important. Contact a health care provider if you:  Have new symptoms or your symptoms get worse.  Gain more than 5 lb (2.3 kg) in 1 week.  Have a lump in your neck.  Have a scratchy throat or difficulty speaking that lasts longer than a month and is not related to a cold.  Have a hard time swallowing. Get help right away if:  Your baby is less active than normal.  Your baby stops moving completely.  You develop muscle cramps.  You have pain in your abdomen.  You have heavy bleeding.  You develop a fever or chills.  You have a very bad headache or vision problems.  You develop swelling in your legs and ankles. Summary  Hypothyroidism is a condition that develops if your thyroid has low activity.  Hypothyroidism during pregnancy can lead to complications for both you and your baby.  Take medicines, vitamins, and  supplements as told by your health care provider during your pregnancy to control your condition.  Keep regular prenatal appointments so your health care provider can closely monitor your condition during pregnancy. This information is not intended to  replace advice given to you by your health care provider. Make sure you discuss any questions you have with your health care provider. Document Released: 08/05/2007 Document Revised: 01/30/2019 Document Reviewed: 11/12/2017 Elsevier Patient Education  2020 [employer] Glucose Tolerance Test Why am I having this test? The glucose tolerance test (GTT) is done to check how your body processes sugar (glucose). This is one of several tests used to diagnose diabetes (diabetes mellitus). Your health care provider may recommend this test if you:  Have a family history of diabetes.  Are very overweight (obese).  Have infections that keep coming back (recurring).  Have had a lot of wounds that did not heal quickly, especially on your legs and feet.  Are a woman and have a history of giving birth to very large babies or a history of repeated fetal loss (stillbirth).  Have had high glucose levels in your urine or blood: ? During a past pregnancy. ? After a heart attack, surgery, or prolonged periods of high stress. What is being tested? This test measures the amount of glucose in your blood at different times during a period of 2 hours. This indicates how well your body is able to process glucose. What kind of sample is taken?  Blood samples are required for this test. They are usually collected by inserting a needle into a blood vessel. How do I prepare for this test?  For 3 days before your test, eat normally. Have plenty of carbohydrate-rich foods.  Follow instructions from your health care provider about: ? Eating or drinking restrictions on the day of the test. You may be asked to not eat or drink anything other than water (fast) starting 8-12 hours before the test. ? Changing or stopping your regular medicines. Some medicines may interfere with this test. Tell a health care provider about:  All medicines you are taking, including vitamins, herbs, eye drops, creams, and  over-the-counter medicines.  Any blood disorders you have.  Any surgeries you have had.  Any medical conditions you have.  Whether you are pregnant or may be pregnant. What happens during the test? First, your blood glucose will be measured. This is referred to as your fasting blood glucose, since you fasted before the test. Then, you will drink a glucose solution that contains a certain amount of glucose. Your blood glucose will be measured again 1 and 2 hours after drinking the solution. This test takes 2 hours to complete. You will need to stay at the testing location during this time. During the testing period:  Do not eat or drink anything other than the glucose solution. You will be allowed to drink water.  Do not exercise.  Do not use any products that contain nicotine or tobacco, such as cigarettes and e-cigarettes. If you need help stopping, ask your health care provider. The testing procedure may vary among health care providers and hospitals. How are the results reported? Your results will be reported as milligrams of glucose per deciliter of blood (mg/dL) or millimoles per liter (mmol/L). Your health care provider will compare your results to normal ranges that were established after testing a large group of people (reference ranges). Reference ranges may vary among labs and hospitals. For this test, common reference ranges are:  Fasting:  less than 110 mg/dL (6.1 mmol/L).  1 hour after drinking glucose: less than 180 mg/dL (16.1 mmol/L).  2 hours after drinking glucose: less than 140 mg/dL (7.8 mmol/L). What do the results mean? Results that are within the reference ranges are considered normal, meaning that your glucose levels are well controlled. Results higher than the reference ranges may mean that you recently experienced stress, such as from an injury or a sudden (acute) condition like a heart attack or stroke, or that you have:  Diabetes.  Cushing syndrome.   Tumors such as pheochromocytoma or glucagonoma.  Kidney failure.  Pancreatitis.  Hyperthyroidism.  An infection. Talk with your health care provider about what your results mean. Questions to ask your health care provider Ask your health care provider, or the department that is doing the test:  When will my results be ready?  How will I get my results?  What are my treatment options?  What other tests do I need?  What are my next steps? Summary  The glucose tolerance test (GTT) is done to check how your body processes sugar (glucose). This is one of several tests used to diagnose diabetes (diabetes mellitus).  This test measures the amount of glucose in your blood at different times during a period of 2 hours. This indicates how well your body is able to process glucose.  Talk with your health care provider about what your results mean. This information is not intended to replace advice given to you by your health care provider. Make sure you discuss any questions you have with your health care provider. Document Released: 10/31/2004 Document Revised: 09/20/2017 Document Reviewed: 05/20/2017 Elsevier Patient Education  2020 [employer]. Third Trimester of Pregnancy The third trimester is from week 28 through week 40 (months 7 through 9). The third trimester is a time when the unborn baby (fetus) is growing rapidly. At the end of the ninth month, the fetus is about 20 inches in length and weighs 6-10 pounds. Body changes during your third trimester Your body will continue to go through many changes during pregnancy. The changes vary from woman to woman. During the third trimester:  Your weight will continue to increase. You can expect to gain 25-35 pounds (11-16 kg) by the end of the pregnancy.  You may begin to get stretch marks on your hips, abdomen, and breasts.  You may urinate more often because the fetus is moving lower into your pelvis and pressing on your bladder.   You may develop or continue to have heartburn. This is caused by increased hormones that slow down muscles in the digestive tract.  You may develop or continue to have constipation because increased hormones slow digestion and cause the muscles that push waste through your intestines to relax.  You may develop hemorrhoids. These are swollen veins (varicose veins) in the rectum that can itch or be painful.  You may develop swollen, bulging veins (varicose veins) in your legs.  You may have increased body aches in the pelvis, back, or thighs. This is due to weight gain and increased hormones that are relaxing your joints.  You may have changes in your hair. These can include thickening of your hair, rapid growth, and changes in texture. Some women also have hair loss during or after pregnancy, or hair that feels dry or thin. Your hair will most likely return to normal after your baby is born.  Your breasts will continue to grow and they will continue to become tender. A  yellow fluid (colostrum) may leak from your breasts. This is the first milk you are producing for your baby.  Your belly button may stick out.  You may notice more swelling in your hands, face, or ankles.  You may have increased tingling or numbness in your hands, arms, and legs. The skin on your belly may also feel numb.  You may feel short of breath because of your expanding uterus.  You may have more problems sleeping. This can be caused by the size of your belly, increased need to urinate, and an increase in your body's metabolism.  You may notice the fetus "dropping," or moving lower in your abdomen (lightening).  You may have increased vaginal discharge.  You may notice your joints feel loose and you may have pain around your pelvic bone. What to expect at prenatal visits You will have prenatal exams every 2 weeks until week 36. Then you will have weekly prenatal exams. During a routine prenatal visit:  You will be  weighed to make sure you and the baby are growing normally.  Your blood pressure will be taken.  Your abdomen will be measured to track your baby's growth.  The fetal heartbeat will be listened to.  Any test results from the previous visit will be discussed.  You may have a cervical check near your due date to see if your cervix has softened or thinned (effaced).  You will be tested for Group B streptococcus. This happens between 35 and 37 weeks. Your health care provider may ask you:  What your birth plan is.  How you are feeling.  If you are feeling the baby move.  If you have had any abnormal symptoms, such as leaking fluid, bleeding, severe headaches, or abdominal cramping.  If you are using any tobacco products, including cigarettes, chewing tobacco, and electronic cigarettes.  If you have any questions. Other tests or screenings that may be performed during your third trimester include:  Blood tests that check for low iron levels (anemia).  Fetal testing to check the health, activity level, and growth of the fetus. Testing is done if you have certain medical conditions or if there are problems during the pregnancy.  Nonstress test (NST). This test checks the health of your baby to make sure there are no signs of problems, such as the baby not getting enough oxygen. During this test, a belt is placed around your belly. The baby is made to move, and its heart rate is monitored during movement. What is false labor? False labor is a condition in which you feel small, irregular tightenings of the muscles in the womb (contractions) that usually go away with rest, changing position, or drinking water. These are called Braxton Hicks contractions. Contractions may last for hours, days, or even weeks before true labor sets in. If contractions come at regular intervals, become more frequent, increase in intensity, or become painful, you should see your health care provider. What are the  signs of labor?  Abdominal cramps.  Regular contractions that start at 10 minutes apart and become stronger and more frequent with time.  Contractions that start on the top of the uterus and spread down to the lower abdomen and back.  Increased pelvic pressure and dull back pain.  A watery or bloody mucus discharge that comes from the vagina.  Leaking of amniotic fluid. This is also known as your "water breaking." It could be a slow trickle or a gush. Let your health care provider know  if it has a color or strange odor. If you have any of these signs, call your health care provider right away, even if it is before your due date. Follow these instructions at home: Medicines  Follow your health care provider's instructions regarding medicine use. Specific medicines may be either safe or unsafe to take during pregnancy.  Take a prenatal vitamin that contains at least 600 micrograms (mcg) of folic acid.  If you develop constipation, try taking a stool softener if your health care provider approves. Eating and drinking   Eat a balanced diet that includes fresh fruits and vegetables, whole grains, good sources of protein such as meat, eggs, or tofu, and low-fat dairy. Your health care provider will help you determine the amount of weight gain that is right for you.  Avoid raw meat and uncooked cheese. These carry germs that can cause birth defects in the baby.  If you have low calcium intake from food, talk to your health care provider about whether you should take a daily calcium supplement.  Eat four or five small meals rather than three large meals a day.  Limit foods that are high in fat and processed sugars, such as fried and sweet foods.  To prevent constipation: ? Drink enough fluid to keep your urine clear or pale yellow. ? Eat foods that are high in fiber, such as fresh fruits and vegetables, whole grains, and beans. Activity  Exercise only as directed by your health care  provider. Most women can continue their usual exercise routine during pregnancy. Try to exercise for 30 minutes at least 5 days a week. Stop exercising if you experience uterine contractions.  Avoid heavy lifting.  Do not exercise in extreme heat or humidity, or at high altitudes.  Wear low-heel, comfortable shoes.  Practice good posture.  You may continue to have sex unless your health care provider tells you otherwise. Relieving pain and discomfort  Take frequent breaks and rest with your legs elevated if you have leg cramps or low back pain.  Take warm sitz baths to soothe any pain or discomfort caused by hemorrhoids. Use hemorrhoid cream if your health care provider approves.  Wear a good support bra to prevent discomfort from breast tenderness.  If you develop varicose veins: ? Wear support pantyhose or compression stockings as told by your healthcare provider. ? Elevate your feet for 15 minutes, 3-4 times a day. Prenatal care  Write down your questions. Take them to your prenatal visits.  Keep all your prenatal visits as told by your health care provider. This is important. Safety  Wear your seat belt at all times when driving.  Make a list of emergency phone numbers, including numbers for family, friends, the hospital, and police and fire departments. General instructions  Avoid cat litter boxes and soil used by cats. These carry germs that can cause birth defects in the baby. If you have a cat, ask someone to clean the litter box for you.  Do not travel far distances unless it is absolutely necessary and only with the approval of your health care provider.  Do not use hot tubs, steam rooms, or saunas.  Do not drink alcohol.  Do not use any products that contain nicotine or tobacco, such as cigarettes and e-cigarettes. If you need help quitting, ask your health care provider.  Do not use any medicinal herbs or unprescribed drugs. These chemicals affect the formation  and growth of the baby.  Do not douche or use  tampons or scented sanitary pads.  Do not cross your legs for long periods of time.  To prepare for the arrival of your baby: ? Take prenatal classes to understand, practice, and ask questions about labor and delivery. ? Make a trial run to the hospital. ? Visit the hospital and tour the maternity area. ? Arrange for maternity or paternity leave through employers. ? Arrange for family and friends to take care of pets while you are in the hospital. ? Purchase a rear-facing car seat and make sure you know how to install it in your car. ? Pack your hospital bag. ? Prepare the baby's nursery. Make sure to remove all pillows and stuffed animals from the baby's crib to prevent suffocation.  Visit your dentist if you have not gone during your pregnancy. Use a soft toothbrush to brush your teeth and be gentle when you floss. Contact a health care provider if:  You are unsure if you are in labor or if your water has broken.  You become dizzy.  You have mild pelvic cramps, pelvic pressure, or nagging pain in your abdominal area.  You have lower back pain.  You have persistent nausea, vomiting, or diarrhea.  You have an unusual or bad smelling vaginal discharge.  You have pain when you urinate. Get help right away if:  Your water breaks before 37 weeks.  You have regular contractions less than 5 minutes apart before 37 weeks.  You have a fever.  You are leaking fluid from your vagina.  You have spotting or bleeding from your vagina.  You have severe abdominal pain or cramping.  You have rapid weight loss or weight gain.  You have shortness of breath with chest pain.  You notice sudden or extreme swelling of your face, hands, ankles, feet, or legs.  Your baby makes fewer than 10 movements in 2 hours.  You have severe headaches that do not go away when you take medicine.  You have vision changes. Summary  The third trimester  is from week 28 through week 40, months 7 through 9. The third trimester is a time when the unborn baby (fetus) is growing rapidly.  During the third trimester, your discomfort may increase as you and your baby continue to gain weight. You may have abdominal, leg, and back pain, sleeping problems, and an increased need to urinate.  During the third trimester your breasts will keep growing and they will continue to become tender. A yellow fluid (colostrum) may leak from your breasts. This is the first milk you are producing for your baby.  False labor is a condition in which you feel small, irregular tightenings of the muscles in the womb (contractions) that eventually go away. These are called Braxton Hicks contractions. Contractions may last for hours, days, or even weeks before true labor sets in.  Signs of labor can include: abdominal cramps; regular contractions that start at 10 minutes apart and become stronger and more frequent with time; watery or bloody mucus discharge that comes from the vagina; increased pelvic pressure and dull back pain; and leaking of amniotic fluid. This information is not intended to replace advice given to you by your health care provider. Make sure you discuss any questions you have with your health care provider. Document Released: 10/02/2001 Document Revised: 01/29/2019 Document Reviewed: 11/13/2016 Elsevier Patient Education  2020 [employer] Inc[employer]  Healthy [employer] Gain During Pregnancy, Adult A certain amount of weight gain during pregnancy is normal and healthy. How much weight  you should gain depends on your overall health and a measurement called BMI (body mass index). BMI is an estimate of your body fat based on your height and weight. You can use an Software engineer to figure out your BMI, or you can ask your health care provider to calculate it for you at your next visit. Your recommended pregnancy weight gain is based on your pre-pregnancy BMI. General  guidelines for a healthy total weight gain during pregnancy are listed below. If your BMI at or before the start of your pregnancy is:  Less than 18.5 (underweight), you should gain 28-40 lb (13-18 kg).  18.5-24.9 (normal weight), you should gain 25-35 lb (11-16 kg).  25-29.9 (overweight), you should gain 15-25 lb (7-11 kg).  30 or higher (obese), you should gain 11-20 lb (5-9 kg). These ranges vary depending on your individual health. If you are carrying more than one baby (multiples), it may be safe to gain more weight than these recommendations. If you gain less weight than recommended, that may be safe as long as your baby is growing and developing normally. How can unhealthy weight gain affect me and my baby? Gaining too much weight during pregnancy can lead to pregnancy complications, such as:  A temporary form of diabetes that develops during pregnancy (gestational diabetes).  High blood pressure during pregnancy and protein in your urine (preeclampsia).  High blood pressure during pregnancy without protein in your urine (gestational hypertension).  Your baby having a high weight at birth, which may: ? Raise your risk of having a more difficult delivery or a surgical delivery (cesarean delivery, or C-section). ? Raise your child's risk of developing obesity during childhood. Not gaining enough weight can be life-threatening for your baby, and it may raise your baby's chances of:  Being born early (preterm).  Growing more slowly than normal during pregnancy (growth restriction).  Having a low weight at birth. What actions can I take to gain a healthy amount of weight during pregnancy? General instructions  Keep track of your weight gain during pregnancy.  Take over-the-counter and prescription medicines only as told by your health care provider. Take all prenatal supplements as directed.  Keep all health care visits during pregnancy (prenatal visits). These visits are a good  time to discuss your weight gain. Your health care provider will weigh you at each visit to make sure you are gaining a healthy amount of weight. Nutrition   Eat a balanced, nutrient-rich diet. Eat plenty of: ? Fruits and vegetables, such as berries and broccoli. ? Whole grains, such as millet, barley, whole-wheat breads and cereals, and oatmeal. ? Low-fat dairy products or non-dairy products such as almond milk or rice milk. ? Protein foods, such as lean meat, chicken, eggs, and legumes (such as peas, beans, soybeans, and lentils).  Avoid foods that are fried or have a lot of fat, salt (sodium), or sugar.  Drink enough fluid to keep your urine pale yellow.  Choose healthy snack and drink options when you are at work or on the go: ? Drink water. Avoid soda, sports drinks, and juices that have added sugar. ? Avoid drinks with caffeine, such as coffee and energy drinks. ? Eat snacks that are high in protein, such as nuts, protein bars, and low-fat yogurt. ? Carry convenient snacks in your purse that do not need refrigeration, such as a pack of trail mix, an apple, or a granola bar.  If you need help improving your diet, work with a health  care provider or a diet and nutrition specialist (dietitian). Activity   Exercise regularly, as told by your health care provider. ? If you were active before becoming pregnant, you may be able to continue your regular fitness activities. ? If you were not active before pregnancy, you may gradually build up to exercising for 30 or more minutes on most days of the week. This may include walking, swimming, or yoga.  Ask your health care provider what activities are safe for you. Talk with your health care provider about whether you may need to be excused from certain school or work activities. Where to find more information Learn more about managing your weight gain during pregnancy from:  American Pregnancy Association: www.americanpregnancy.org  U.S[place]  Department of Agriculture pregnancy weight gain calculator: https://ball-collins.biz/www.choosemyplate.gov Summary  Too much weight gain during pregnancy can lead to complications for you and your baby.  Find out your pre-pregnancy BMI to determine how much weight gain is healthy for you.  Eat nutritious foods and stay active.  Keep all of your prenatal visits as told by your health care provider. This information is not intended to replace advice given to you by your health care provider. Make sure you discuss any questions you have with your health care provider. Document Released: 06/28/2017 Document Revised: 06/28/2017 Document Reviewed: 06/28/2017 Elsevier Patient Education  2020 Elsevier Inc.  AREA PEDIATRIC/FAMILY PRACTICE PHYSICIANS  [facility] [address] [place], [place] [address] Phone - [phone]   Fax - [phone]  JACK AMOS [address] [place], [place]  [address] Phone - [phone]   Fax - [phone]  [facility] [address] [place], [place]  [address] Phone - [phone]   Fax - [phone]  [facility] [address] [place], [place]  [address] Phone - [phone]   Fax - [phone]  [facility] [address], [address] [place], [place]  [address] Phone - [phone]   Fax - [phone]  [facility] [address], [address] [place], [place]  [address] Phone - [phone]   Fax - [phone]  [facility] [address] [place], [place]  [address] Phone - [phone]   Fax - [phone]  [facility] [address] [place], [place]  [address] Phone - [phone]   Fax - [phone]  [facility] [address] [place], [place]  [address] Phone - [phone]   Fax - [phone] [facility] [address]  [place], [place]  [address] Phone - [phone]   Fax - [phone]  [facility] [address] [place], [place]  [address] Phone - [phone]   Fax - [phone]  [facility] [address], [address] [place], [place]  [address] Phone - [phone]   Fax - [phone]  [facility] [address] [place], [place]  [address] Phone - [phone]   Fax - [phone]  [facility] [address], [address] [place], [place]  [address] Phone - [phone]  [facility] [address] , [place]  [address] Phone - [phone]   Fax - [phone]  [facility] [address] [place], [place]  [address] Phone - [phone]   Fax - [phone]  [facility] [address] [place], [place]  [address] Phone -  [phone]   Fax - [phone]  [facility] [address] [place], [place]  [address] Phone - [phone]   Fax - [phone]   [facility] [address] [place], [place]  [address] Phone - [phone]   Fax - [phone]  [facility] [address], [address] [place], [place]  [address] Phone - [phone]   Fax - [phone]  DAVID RUBIN [address] [place], [place]  [address] Phone - [phone]   Fax - [phone]  [facility] [address] [place], [place]  [address] Phone - [phone]   Fax - [phone]  [place] [facility] Alita Chyle [address] [place], [place]  [address] Phone - [phone]   Fax - [phone] Gerarda Fraction [address] [place], [place]  [address] Phone - [phone]   Fax - [phone]  [facility] [address] [place], [place]  [address] Phone - [phone]   Fax - [phone]  [facility] [place] [address]  [place], [place]  [address] Phone - [phone]   Fax - [phone]

## 2019-07-24 NOTE — Progress Notes (Signed)
Subjective:  Sarah [name] is a 28 y.o. G1P0 at [redacted]w[redacted]d being seen today for ongoing prenatal care.  She is currently monitored for the following issues for this low-risk pregnancy and has Encounter for supervision of low-risk first pregnancy, antepartum; Hypothyroidism affecting pregnancy in first trimester; and Gallstones on their problem list.  Patient reports no complaints.  Contractions: Not present. Vag. Bleeding: None.  Movement: Present. Denies leaking of fluid.   The following portions of the patient's history were reviewed and updated as appropriate: allergies, current medications, past family history, past medical history, past social history, past surgical history and problem list. Problem list updated.  Objective:   Vitals:   07/24/19 0834  BP: 110/71  Pulse: 75  Weight: 174 lb (78.9 kg)    Fetal Status: Fetal Heart Rate (bpm): 140 Fundal Height: 28 cm Movement: Present     General:  Alert, oriented and cooperative. Patient is in no acute distress.  Skin: Skin is warm and dry. No rash noted.   Cardiovascular: Normal heart rate noted  Respiratory: Normal respiratory effort, no problems with respiration noted  Abdomen: Soft, gravid, appropriate for gestational age. Pain/Pressure: Absent     Pelvic: Vag. Bleeding: None     Cervical exam deferred        Extremities: Normal range of motion.     Mental Status: Normal mood and affect. Normal behavior. Normal judgment and thought content.   Assessment and Plan:  Pregnancy: G1P0 at [redacted]w[redacted]d  1. Encounter for supervision of low-risk first pregnancy, antepartum - discussed healthy weight gain in pregnancy, pt has only gained 4lbs - discussed dietary options in light of gallstones, pt offered nutrition counseling, declines at this time, but will consider - Tdap vaccine greater than or equal to 7yo IM - peds list given, pt still undecided - HIV antibody (with reflex) - RPR - CBC w/Diff - Glucose, 2 hour gestational  2.  Hypothyroid in pregnancy, antepartum, first trimester - pt states is on 30mg  - Thyroid Panel With TSH  3. Gallstones - discussed low-fat diet   Preterm labor symptoms and general obstetric precautions including but not limited to vaginal bleeding, contractions, leaking of fluid and fetal movement were reviewed in detail with the patient. Please refer to After Visit Summary for other counseling recommendations.  Return in about 4 weeks (around 08/21/2019) for in-person.  Calle Schader, Gerrie Nordmann, NP

## 2019-07-25 LAB — CBC WITH DIFFERENTIAL/PLATELET
Basophils Absolute: 0 10*3/uL (ref 0.0–0.2)
Basos: 0 %
EOS (ABSOLUTE): 0.2 10*3/uL (ref 0.0–0.4)
Eos: 2 %
Hematocrit: 36.2 % (ref 34.0–46.6)
Hemoglobin: 12.2 g/dL (ref 11.1–15.9)
Immature Grans (Abs): 0 10*3/uL (ref 0.0–0.1)
Immature Granulocytes: 0 %
Lymphocytes Absolute: 1.8 10*3/uL (ref 0.7–3.1)
Lymphs: 25 %
MCH: 30.3 pg (ref 26.6–33.0)
MCHC: 33.7 g/dL (ref 31.5–35.7)
MCV: 90 fL (ref 79–97)
Monocytes Absolute: 0.6 10*3/uL (ref 0.1–0.9)
Monocytes: 8 %
Neutrophils Absolute: 4.5 10*3/uL (ref 1.4–7.0)
Neutrophils: 65 %
Platelets: 207 10*3/uL (ref 150–450)
RBC: 4.02 x10E6/uL (ref 3.77–5.28)
RDW: 13 % (ref 11.7–15.4)
WBC: 7.1 10*3/uL (ref 3.4–10.8)

## 2019-07-25 LAB — THYROID PANEL WITH TSH
Free Thyroxine Index: 1.2 (ref 1.2–4.9)
T3 Uptake Ratio: 12 % — ABNORMAL LOW (ref 24–39)
T4, Total: 9.8 ug/dL (ref 4.5–12.0)
TSH: 3.24 u[IU]/mL (ref 0.450–4.500)

## 2019-07-25 LAB — GLUCOSE TOLERANCE, 2 HOURS W/ 1HR
Glucose, 1 hour: 131 mg/dL (ref 65–179)
Glucose, 2 hour: 120 mg/dL (ref 65–152)
Glucose, Fasting: 72 mg/dL (ref 65–91)

## 2019-07-25 LAB — HIV ANTIBODY (ROUTINE TESTING W REFLEX): HIV Screen 4th Generation wRfx: NONREACTIVE

## 2019-07-25 LAB — RPR: RPR Ser Ql: NONREACTIVE

## 2019-07-31 ENCOUNTER — Ambulatory Visit (HOSPITAL_COMMUNITY): Payer: 59 | Admitting: *Deleted

## 2019-07-31 ENCOUNTER — Ambulatory Visit (HOSPITAL_COMMUNITY)
Admission: RE | Admit: 2019-07-31 | Discharge: 2019-07-31 | Disposition: A | Discharge status: Home / Self Care | Payer: 59 | Source: Ambulatory Visit | Attending: Obstetrics and Gynecology | Admitting: Obstetrics and Gynecology

## 2019-07-31 ENCOUNTER — Other Ambulatory Visit: Payer: Self-pay

## 2019-07-31 ENCOUNTER — Encounter (HOSPITAL_COMMUNITY): Payer: Self-pay

## 2019-07-31 DIAGNOSIS — Z3A28 28 weeks gestation of pregnancy: Secondary | ICD-10-CM

## 2019-07-31 DIAGNOSIS — O99283 Endocrine, nutritional and metabolic diseases complicating pregnancy, third trimester: Secondary | ICD-10-CM | POA: Diagnosis not present

## 2019-07-31 DIAGNOSIS — Z362 Encounter for other antenatal screening follow-up: Secondary | ICD-10-CM | POA: Diagnosis not present

## 2019-07-31 DIAGNOSIS — O403XX0 Polyhydramnios, third trimester, not applicable or unspecified: Secondary | ICD-10-CM

## 2019-07-31 DIAGNOSIS — K8020 Calculus of gallbladder without cholecystitis without obstruction: Secondary | ICD-10-CM | POA: Diagnosis present

## 2019-07-31 DIAGNOSIS — E039 Hypothyroidism, unspecified: Secondary | ICD-10-CM | POA: Diagnosis present

## 2019-07-31 DIAGNOSIS — O99280 Endocrine, nutritional and metabolic diseases complicating pregnancy, unspecified trimester: Secondary | ICD-10-CM | POA: Diagnosis not present

## 2019-08-03 ENCOUNTER — Other Ambulatory Visit (HOSPITAL_COMMUNITY): Payer: Self-pay | Admitting: *Deleted

## 2019-08-03 DIAGNOSIS — O403XX0 Polyhydramnios, third trimester, not applicable or unspecified: Secondary | ICD-10-CM

## 2019-08-21 ENCOUNTER — Encounter: Payer: Self-pay | Admitting: Family Medicine

## 2019-08-21 ENCOUNTER — Ambulatory Visit (INDEPENDENT_AMBULATORY_CARE_PROVIDER_SITE_OTHER): Payer: 59 | Admitting: Family Medicine

## 2019-08-21 ENCOUNTER — Other Ambulatory Visit: Payer: Self-pay

## 2019-08-21 VITALS — BP 112/78 | HR 72 | Wt 177.0 lb

## 2019-08-21 DIAGNOSIS — Z3400 Encounter for supervision of normal first pregnancy, unspecified trimester: Secondary | ICD-10-CM

## 2019-08-21 DIAGNOSIS — Z3A31 31 weeks gestation of pregnancy: Secondary | ICD-10-CM

## 2019-08-21 DIAGNOSIS — E039 Hypothyroidism, unspecified: Secondary | ICD-10-CM

## 2019-08-21 DIAGNOSIS — O99283 Endocrine, nutritional and metabolic diseases complicating pregnancy, third trimester: Secondary | ICD-10-CM

## 2019-08-21 NOTE — Patient Instructions (Signed)
Breastfeeding and Self-Care It is normal to have some problems when you start to breastfeed your new baby. But there are things that you can do to take care of yourself and help prevent many common problems. This includes keeping your breasts healthy and making sure that your baby's mouth attaches (latches) properly to your nipple for feedings. Work with your doctor or breastfeeding specialist (lactation consultant) to find what works best for you. Follow these instructions at home: Breastfeeding strategy   Always make sure that your baby latches properly to breastfeed.  Make sure that your baby is in a proper position. Try different breastfeeding positions to find one that works best for you and your baby.  Breastfeed when you feel like you need to make your breasts less full or when your baby shows signs of hunger. This is called "breastfeeding on demand."  Do not delay feedings.  Try to relax when it is time to feed your baby. This helps your body release milk from your breast.  To help increase milk flow: ? Remove a small amount of milk from your breast right before breastfeeding. Do this using a pump or by squeezing with your hand. ? Apply warm, moist heat to your breast right before feeding. You can do this in the shower or with hand towels soaked with warm water. ? Massage your breast right before or during feeding. Breast care   To help your breasts stay healthy and keep them from getting too dry: ? Avoid using soap on your nipples. ? Let your nipples air-dry for 3-4 minutes after each feeding. ? Use only cotton bra pads to soak up breast milk that leaks. Be sure to change the pads if they become soaked with milk. If you use bra pads that can be thrown away, change them often. ? Put some lanolin on your nipples after breastfeeding. Pure lanolin does not need to be washed off your nipple before you feed your baby again. Pure lanolin is not harmful to your baby. ? Rub some breast  milk into your nipples: ? Use your hand to squeeze out a few drops of breast milk. ? Gently massage the milk into your nipples. ? Let your nipples air-dry.  Wear a supportive nursing bra. Avoid wearing: ? Tight clothing. ? Underwire bras or bras that put pressure on your breasts.  Use ice to help relieve pain or swelling of your breasts: ? Put ice in a plastic bag. ? Place a towel between your skin and the bag. ? Leave the ice on for 20 minutes, 2-3 times a day. General instructions  Drink enough fluid to keep your pee (urine) pale yellow.  Get plenty of rest. Sleep when your baby sleeps.  Talk to your doctor or breastfeeding specialist before taking any herbal supplements. Contact a health care provider if:  You have nipple pain.  You have cracking or soreness in your nipples that lasts longer than 1 week.  Your breasts are overfilled with milk (engorgement) and this lasts longer than 48 hours.  You have a fever.  You have pus-like fluid coming from your nipple.  You have redness, a rash, swelling, itching, or burning on your breast.  Your baby does not gain weight.  Your baby loses weight. Summary  There are things that you can do to take care of yourself and help prevent many common breastfeeding problems.  Always make sure that your baby's mouth attaches (latches) to your nipple properly to breastfeed.  Keep your nipples   from getting too dry, drink plenty of fluid, and get plenty of rest.  Feed on demand. Do not delay feedings. This information is not intended to replace advice given to you by your health care provider. Make sure you discuss any questions you have with your health care provider. Document Released: 05/15/2017 Document Revised: 01/28/2019 Document Reviewed: 05/15/2017 Elsevier Patient Education  2020 Elsevier Inc.  

## 2019-08-21 NOTE — Progress Notes (Signed)
Subjective:  Sarah [name] is a 28 y.o. G1P0 at [redacted]w[redacted]d being seen today for ongoing prenatal care.  She is currently monitored for the following issues for this low-risk pregnancy and has Encounter for supervision of low-risk first pregnancy, antepartum; Hypothyroidism affecting pregnancy in first trimester; and Gallstones on their problem list.  Patient reports no complaints.  Contractions: Not present.  .  Movement: Present. Denies leaking of fluid.   The following portions of the patient's history were reviewed and updated as appropriate: allergies, current medications, past family history, past medical history, past social history, past surgical history and problem list. Problem list updated.  Objective:   Vitals:   08/21/19 1004  BP: 112/78  Pulse: 72  Weight: 177 lb (80.3 kg)    Fetal Status: Fetal Heart Rate (bpm): 148 Fundal Height: 31 cm Movement: Present     General:  Alert, oriented and cooperative. Patient is in no acute distress.  Skin: Skin is warm and dry. No rash noted.   Cardiovascular: Normal heart rate noted  Respiratory: Normal respiratory effort, no problems with respiration noted  Abdomen: Soft, gravid, appropriate for gestational age. Pain/Pressure: Present     Pelvic:       Cervical exam deferred        Extremities: Normal range of motion.  Edema: Trace  Mental Status: Normal mood and affect. Normal behavior. Normal judgment and thought content.    Assessment and Plan:  Pregnancy: G1P0 at [redacted]w[redacted]d  1. Encounter for supervision of low-risk first pregnancy, antepartum - Continue routine prenatal care  2. Hypothyroidism affecting pregnancy in first trimester - Most recent TSH 07/24/19- 3.240 - No medication adjustments  Preterm labor symptoms and general obstetric precautions including but not limited to vaginal bleeding, contractions, leaking of fluid and fetal movement were reviewed in detail with the patient. Please refer to After Visit Summary for other  counseling recommendations.  Return in about 2 weeks (around 09/04/2019) for ROB.   Sonakshi Rolland L, DO

## 2019-08-28 ENCOUNTER — Ambulatory Visit (HOSPITAL_COMMUNITY)
Admission: RE | Admit: 2019-08-28 | Discharge: 2019-08-28 | Disposition: A | Discharge status: Home / Self Care | Payer: 59 | Source: Ambulatory Visit | Attending: Obstetrics and Gynecology | Admitting: Obstetrics and Gynecology

## 2019-08-28 ENCOUNTER — Other Ambulatory Visit: Payer: Self-pay

## 2019-08-28 DIAGNOSIS — O99283 Endocrine, nutritional and metabolic diseases complicating pregnancy, third trimester: Secondary | ICD-10-CM

## 2019-08-28 DIAGNOSIS — Z3A32 32 weeks gestation of pregnancy: Secondary | ICD-10-CM

## 2019-08-28 DIAGNOSIS — E039 Hypothyroidism, unspecified: Secondary | ICD-10-CM

## 2019-08-28 DIAGNOSIS — O403XX0 Polyhydramnios, third trimester, not applicable or unspecified: Secondary | ICD-10-CM | POA: Diagnosis not present

## 2019-09-04 ENCOUNTER — Other Ambulatory Visit: Payer: Self-pay

## 2019-09-04 ENCOUNTER — Ambulatory Visit (INDEPENDENT_AMBULATORY_CARE_PROVIDER_SITE_OTHER): Payer: 59 | Admitting: Family Medicine

## 2019-09-04 VITALS — BP 103/70 | HR 82 | Wt 179.0 lb

## 2019-09-04 DIAGNOSIS — Z3A33 33 weeks gestation of pregnancy: Secondary | ICD-10-CM

## 2019-09-04 DIAGNOSIS — O99283 Endocrine, nutritional and metabolic diseases complicating pregnancy, third trimester: Secondary | ICD-10-CM

## 2019-09-04 DIAGNOSIS — Z3400 Encounter for supervision of normal first pregnancy, unspecified trimester: Secondary | ICD-10-CM

## 2019-09-04 DIAGNOSIS — Z3493 Encounter for supervision of normal pregnancy, unspecified, third trimester: Secondary | ICD-10-CM

## 2019-09-04 DIAGNOSIS — O26893 Other specified pregnancy related conditions, third trimester: Secondary | ICD-10-CM

## 2019-09-04 DIAGNOSIS — K8020 Calculus of gallbladder without cholecystitis without obstruction: Secondary | ICD-10-CM

## 2019-09-04 DIAGNOSIS — Z3009 Encounter for other general counseling and advice on contraception: Secondary | ICD-10-CM | POA: Insufficient documentation

## 2019-09-04 DIAGNOSIS — E039 Hypothyroidism, unspecified: Secondary | ICD-10-CM

## 2019-09-04 NOTE — Progress Notes (Addendum)
Subjective:  Sarah [name] is a 28 y.o. G1P0 at [redacted]w[redacted]d being seen today for ongoing prenatal care.  She is currently monitored for the following issues for this low-risk pregnancy and has Encounter for supervision of low-risk first pregnancy, antepartum; Hypothyroidism affecting pregnancy in first trimester; Gallstones; and Birth control counseling on their problem list.  Patient reports no complaints.  Contractions: Irritability. Vag. Bleeding: None.  Movement: Present. Denies leaking of fluid.   The following portions of the patient's history were reviewed and updated as appropriate: allergies, current medications, past family history, past medical history, past social history, past surgical history and problem list. Problem list updated.  Objective:   Vitals:   09/04/19 1107  BP: 103/70  Pulse: 82  Weight: 179 lb (81.2 kg)    Fetal Status: Fetal Heart Rate (bpm): 150 Fundal Height: 32 cm Movement: Present     General:  Alert, oriented and cooperative. Patient is in no acute distress.  Skin: Skin is warm and dry. No rash noted.   Cardiovascular: Normal heart rate noted  Respiratory: Normal respiratory effort, no problems with respiration noted  Abdomen: Soft, gravid, appropriate for gestational age. Pain/Pressure: Absent     Pelvic: Vag. Bleeding: None     Cervical exam deferred        Extremities: Normal range of motion.  Edema: Trace  Mental Status: Normal mood and affect. Normal behavior. Normal judgment and thought content.     Assessment and Plan:  Pregnancy: G1P0 at [redacted]w[redacted]d  1. Encounter for supervision of low-risk first pregnancy, antepartum - Continue routine prenatal care  2. Hypothyroidism affecting pregnancy in first trimester - TSH 3.240 on 07/24/19, no medication adjustments  3. Gallstones - Symptoms reduced with better diet control, still will have discomfort when she goes out to eat  4. Birth control counseling - Patient has been counseled on birth control  options today. We discussed risks and benefits of each available contraception, including but not limited to: IUD, Nexplanon, Depo Shot, Nuvaring, OCPs, and condoms/diaphragms. Counseled on risk for STDs not reduced by birth control use.  Pamphlet provided on different methods of contraception for the patient to discuss with her husband.  Preterm labor symptoms and general obstetric precautions including but not limited to vaginal bleeding, contractions, leaking of fluid and fetal movement were reviewed in detail with the patient. Please refer to After Visit Summary for other counseling recommendations.  Return in about 2 weeks (around 09/18/2019) for ROB.   Dreama Kuna L, DO

## 2019-09-04 NOTE — Patient Instructions (Signed)
Breastfeeding and Self-Care It is normal to have some problems when you start to breastfeed your new baby. But there are things that you can do to take care of yourself and help prevent many common problems. This includes keeping your breasts healthy and making sure that your baby's mouth attaches (latches) properly to your nipple for feedings. Work with your doctor or breastfeeding specialist (lactation consultant) to find what works best for you. Follow these instructions at home: Breastfeeding strategy   Always make sure that your baby latches properly to breastfeed.  Make sure that your baby is in a proper position. Try different breastfeeding positions to find one that works best for you and your baby.  Breastfeed when you feel like you need to make your breasts less full or when your baby shows signs of hunger. This is called "breastfeeding on demand."  Do not delay feedings.  Try to relax when it is time to feed your baby. This helps your body release milk from your breast.  To help increase milk flow: ? Remove a small amount of milk from your breast right before breastfeeding. Do this using a pump or by squeezing with your hand. ? Apply warm, moist heat to your breast right before feeding. You can do this in the shower or with hand towels soaked with warm water. ? Massage your breast right before or during feeding. Breast care   To help your breasts stay healthy and keep them from getting too dry: ? Avoid using soap on your nipples. ? Let your nipples air-dry for 3-4 minutes after each feeding. ? Use only cotton bra pads to soak up breast milk that leaks. Be sure to change the pads if they become soaked with milk. If you use bra pads that can be thrown away, change them often. ? Put some lanolin on your nipples after breastfeeding. Pure lanolin does not need to be washed off your nipple before you feed your baby again. Pure lanolin is not harmful to your baby. ? Rub some breast  milk into your nipples: ? Use your hand to squeeze out a few drops of breast milk. ? Gently massage the milk into your nipples. ? Let your nipples air-dry.  Wear a supportive nursing bra. Avoid wearing: ? Tight clothing. ? Underwire bras or bras that put pressure on your breasts.  Use ice to help relieve pain or swelling of your breasts: ? Put ice in a plastic bag. ? Place a towel between your skin and the bag. ? Leave the ice on for 20 minutes, 2-3 times a day. General instructions  Drink enough fluid to keep your pee (urine) pale yellow.  Get plenty of rest. Sleep when your baby sleeps.  Talk to your doctor or breastfeeding specialist before taking any herbal supplements. Contact a health care provider if:  You have nipple pain.  You have cracking or soreness in your nipples that lasts longer than 1 week.  Your breasts are overfilled with milk (engorgement) and this lasts longer than 48 hours.  You have a fever.  You have pus-like fluid coming from your nipple.  You have redness, a rash, swelling, itching, or burning on your breast.  Your baby does not gain weight.  Your baby loses weight. Summary  There are things that you can do to take care of yourself and help prevent many common breastfeeding problems.  Always make sure that your baby's mouth attaches (latches) to your nipple properly to breastfeed.  Keep your nipples   from getting too dry, drink plenty of fluid, and get plenty of rest.  Feed on demand. Do not delay feedings. This information is not intended to replace advice given to you by your health care provider. Make sure you discuss any questions you have with your health care provider. Document Released: 05/15/2017 Document Revised: 01/28/2019 Document Reviewed: 05/15/2017 Elsevier Patient Education  2020 Elsevier Inc.  

## 2019-09-08 ENCOUNTER — Other Ambulatory Visit: Payer: Self-pay | Admitting: Obstetrics and Gynecology

## 2019-09-08 ENCOUNTER — Other Ambulatory Visit: Payer: Self-pay

## 2019-09-08 ENCOUNTER — Ambulatory Visit (INDEPENDENT_AMBULATORY_CARE_PROVIDER_SITE_OTHER): Payer: 59

## 2019-09-08 DIAGNOSIS — R300 Dysuria: Secondary | ICD-10-CM

## 2019-09-08 LAB — POCT URINALYSIS DIPSTICK
Bilirubin, UA: NEGATIVE
Glucose, UA: NEGATIVE
Ketones, UA: NEGATIVE
Leukocytes, UA: NEGATIVE
Nitrite, UA: NEGATIVE
Protein, UA: NEGATIVE
Spec Grav, UA: 1.01 (ref 1.010–1.025)
Urobilinogen, UA: 0.2 E.U./dL
pH, UA: 6.5 (ref 5.0–8.0)

## 2019-09-08 NOTE — Progress Notes (Signed)
Pt presents for dysuria and urinary urgency. Pain is worse when standing and walking, but gets better when she lays down. Pt states that her sx started about 2 days ago. She states that she does get some relief with tylenol. Pt advised to apply warm compress to the area to see if this helps with symptoms. Urine culture sent

## 2019-09-09 NOTE — Progress Notes (Signed)
Patient seen and assessed by nursing staff during this encounter. I have reviewed the chart and agree with the documentation and plan.  Mora Bellman, MD 09/09/2019 9:07 AM

## 2019-09-10 LAB — URINE CULTURE: Organism ID, Bacteria: NO GROWTH

## 2019-09-15 ENCOUNTER — Encounter: Payer: Self-pay | Admitting: *Deleted

## 2019-09-22 ENCOUNTER — Encounter: Payer: 59 | Admitting: Obstetrics and Gynecology

## 2019-09-25 ENCOUNTER — Ambulatory Visit (INDEPENDENT_AMBULATORY_CARE_PROVIDER_SITE_OTHER): Payer: 59 | Admitting: Family Medicine

## 2019-09-25 ENCOUNTER — Other Ambulatory Visit: Payer: Self-pay

## 2019-09-25 VITALS — BP 116/80 | HR 85 | Wt 184.2 lb

## 2019-09-25 DIAGNOSIS — Z3400 Encounter for supervision of normal first pregnancy, unspecified trimester: Secondary | ICD-10-CM

## 2019-09-25 DIAGNOSIS — K8020 Calculus of gallbladder without cholecystitis without obstruction: Secondary | ICD-10-CM

## 2019-09-25 DIAGNOSIS — E039 Hypothyroidism, unspecified: Secondary | ICD-10-CM

## 2019-09-25 DIAGNOSIS — Z3A36 36 weeks gestation of pregnancy: Secondary | ICD-10-CM

## 2019-09-25 DIAGNOSIS — Z113 Encounter for screening for infections with a predominantly sexual mode of transmission: Secondary | ICD-10-CM | POA: Diagnosis not present

## 2019-09-25 DIAGNOSIS — O26893 Other specified pregnancy related conditions, third trimester: Secondary | ICD-10-CM

## 2019-09-25 DIAGNOSIS — O99283 Endocrine, nutritional and metabolic diseases complicating pregnancy, third trimester: Secondary | ICD-10-CM

## 2019-09-25 DIAGNOSIS — Z3009 Encounter for other general counseling and advice on contraception: Secondary | ICD-10-CM

## 2019-09-25 NOTE — Progress Notes (Signed)
Pt is here for ROB. [redacted]w[redacted]d.  

## 2019-09-25 NOTE — Patient Instructions (Signed)
Preterm Labor and Birth Information °Pregnancy normally lasts 39-41 weeks. Preterm labor is when labor starts early. It starts before you have been pregnant for 37 whole weeks. °What are the risk factors for preterm labor? °Preterm labor is more likely to occur in women who: °· Have an infection while pregnant. °· Have a cervix that is short. °· Have gone into preterm labor before. °· Have had surgery on their cervix. °· Are younger than age 29. °· Are older than age 35. °· Are African American. °· Are pregnant with two or more babies. °· Take street drugs while pregnant. °· Smoke while pregnant. °· Do not gain enough weight while pregnant. °· Got pregnant right after another pregnancy. °What are the symptoms of preterm labor? °Symptoms of preterm labor include: °· Cramps. The cramps may feel like the cramps some women get during their period. The cramps may happen with watery poop (diarrhea). °· Pain in the belly (abdomen). °· Pain in the lower back. °· Regular contractions or tightening. It may feel like your belly is getting tighter. °· Pressure in the lower belly that seems to get stronger. °· More fluid (discharge) leaking from the vagina. The fluid may be watery or bloody. °· Water breaking. °Why is it important to notice signs of preterm labor? °Babies who are born early may not be fully developed. They have a higher chance for: °· Long-term heart problems. °· Long-term lung problems. °· Trouble controlling body systems, like breathing. °· Bleeding in the brain. °· A condition called cerebral palsy. °· Learning difficulties. °· Death. °These risks are highest for babies who are born before 34 weeks of pregnancy. °How is preterm labor treated? °Treatment depends on: °· How long you were pregnant. °· Your condition. °· The health of your baby. °Treatment may involve: °· Having a stitch (suture) placed in your cervix. When you give birth, your cervix opens so the baby can come out. The stitch keeps the cervix  from opening too soon. °· Staying at the hospital. °· Taking or getting medicines, such as: °? Hormone medicines. °? Medicines to stop contractions. °? Medicines to help the baby’s lungs develop. °? Medicines to prevent your baby from having cerebral palsy. °What should I do if I am in preterm labor? °If you think you are going into labor too soon, call your doctor right away. °How can I prevent preterm labor? °· Do not use any tobacco products. °? Examples of these are cigarettes, chewing tobacco, and e-cigarettes. °? If you need help quitting, ask your doctor. °· Do not use street drugs. °· Do not use any medicines unless you ask your doctor if they are safe for you. °· Talk with your doctor before taking any herbal supplements. °· Make sure you gain enough weight. °· Watch for infection. If you think you might have an infection, get it checked right away. °· If you have gone into preterm labor before, tell your doctor. °This information is not intended to replace advice given to you by your health care provider. Make sure you discuss any questions you have with your health care provider. °Document Released: 01/04/2009 Document Revised: 01/30/2019 Document Reviewed: 02/29/2016 °Elsevier Patient Education © 2020 Elsevier Inc. ° °

## 2019-09-25 NOTE — Progress Notes (Signed)
Subjective:  Sarah [name] is a 28 y.o. G1P0 at [redacted]w[redacted]d being seen today for ongoing prenatal care.  She is currently monitored for the following issues for this low-risk pregnancy and has Encounter for supervision of low-risk first pregnancy, antepartum; Hypothyroidism affecting pregnancy in first trimester; Gallstones; and Birth control counseling on their problem list.  Patient reports no complaints.  Contractions: Irritability. Vag. Bleeding: None.  Movement: Present. Denies leaking of fluid.   The following portions of the patient's history were reviewed and updated as appropriate: allergies, current medications, past family history, past medical history, past social history, past surgical history and problem list. Problem list updated.  Objective:   Vitals:   09/25/19 1022  BP: 116/80  Pulse: 85  Weight: 184 lb 3.2 oz (83.6 kg)    Fetal Status: Fetal Heart Rate (bpm): 155 Fundal Height: 35 cm Movement: Present     General:  Alert, oriented and cooperative. Patient is in no acute distress.  Skin: Skin is warm and dry. No rash noted.   Cardiovascular: Normal heart rate noted  Respiratory: Normal respiratory effort, no problems with respiration noted  Abdomen: Soft, gravid, appropriate for gestational age. Pain/Pressure: Present     Pelvic: Vag. Bleeding: None     Cervical exam deferred      GBS culture collected today  Extremities: Normal range of motion.  Edema: Trace  Mental Status: Normal mood and affect. Normal behavior. Normal judgment and thought content.    Assessment and Plan:  Pregnancy: G1P0 at [redacted]w[redacted]d  1. Encounter for supervision of low-risk first pregnancy, antepartum - Continue routine prenatal care - GBS and GC/CT collected today - Reviewed how to get to the [facility], Entrance C  2. Hypothyroidism affecting pregnancy in first trimester - TSH 3.240 on 07/24/19, no medication adjustments needed  3. Gallstones - Symptoms reduced with better  diet control, still will have discomfort when she goes out to eat  Preterm labor symptoms and general obstetric precautions including but not limited to vaginal bleeding, contractions, leaking of fluid and fetal movement were reviewed in detail with the patient. Please refer to After Visit Summary for other counseling recommendations.  Return in about 1 week (around 10/02/2019) for ROB.   Aedan Geimer L, DO

## 2019-09-28 LAB — CERVICOVAGINAL ANCILLARY ONLY
Chlamydia: NEGATIVE
Comment: NEGATIVE
Comment: NORMAL
Neisseria Gonorrhea: NEGATIVE

## 2019-09-29 ENCOUNTER — Other Ambulatory Visit: Payer: Self-pay | Admitting: Advanced Practice Midwife

## 2019-09-29 DIAGNOSIS — R1011 Right upper quadrant pain: Secondary | ICD-10-CM

## 2019-09-29 DIAGNOSIS — K8020 Calculus of gallbladder without cholecystitis without obstruction: Secondary | ICD-10-CM

## 2019-09-29 LAB — STREP GP B CULTURE+RFLX: Strep Gp B Culture+Rflx: NEGATIVE

## 2019-09-29 MED ORDER — OXYCODONE-ACETAMINOPHEN 5-325 MG PO TABS: 1.0000 | ORAL_TABLET | Freq: Four times a day (QID) | ORAL | 0 refills | Status: DC | PRN

## 2019-09-29 NOTE — Progress Notes (Signed)
Pt with worsening symptoms 09/29/19, sent Mychart message to office.  Outpatient RUQ [place] ordered, but pt to come to [facility] with severe pain.

## 2019-10-02 ENCOUNTER — Other Ambulatory Visit: Payer: Self-pay

## 2019-10-02 ENCOUNTER — Ambulatory Visit (HOSPITAL_COMMUNITY)
Admission: RE | Admit: 2019-10-02 | Discharge: 2019-10-02 | Disposition: A | Discharge status: Home / Self Care | Payer: 59 | Source: Ambulatory Visit | Attending: Advanced Practice Midwife | Admitting: Advanced Practice Midwife

## 2019-10-02 ENCOUNTER — Encounter: Payer: Self-pay | Admitting: Medical

## 2019-10-02 ENCOUNTER — Ambulatory Visit (INDEPENDENT_AMBULATORY_CARE_PROVIDER_SITE_OTHER): Payer: 59 | Admitting: Medical

## 2019-10-02 VITALS — BP 121/79 | HR 89 | Wt 185.7 lb

## 2019-10-02 DIAGNOSIS — K8020 Calculus of gallbladder without cholecystitis without obstruction: Secondary | ICD-10-CM | POA: Insufficient documentation

## 2019-10-02 DIAGNOSIS — R1011 Right upper quadrant pain: Secondary | ICD-10-CM | POA: Insufficient documentation

## 2019-10-02 DIAGNOSIS — O99281 Endocrine, nutritional and metabolic diseases complicating pregnancy, first trimester: Secondary | ICD-10-CM

## 2019-10-02 DIAGNOSIS — E039 Hypothyroidism, unspecified: Secondary | ICD-10-CM

## 2019-10-02 DIAGNOSIS — O99283 Endocrine, nutritional and metabolic diseases complicating pregnancy, third trimester: Secondary | ICD-10-CM

## 2019-10-02 DIAGNOSIS — Z3A37 37 weeks gestation of pregnancy: Secondary | ICD-10-CM

## 2019-10-02 DIAGNOSIS — N949 Unspecified condition associated with female genital organs and menstrual cycle: Secondary | ICD-10-CM

## 2019-10-02 DIAGNOSIS — Z3400 Encounter for supervision of normal first pregnancy, unspecified trimester: Secondary | ICD-10-CM

## 2019-10-02 MED ORDER — COMFORT FIT MATERNITY SUPP LG MISC: 1.0000 [IU] | Freq: Every day | 0 refills | Status: DC | PRN

## 2019-10-02 NOTE — Patient Instructions (Signed)
[facility]   [address] [place], [place], [place] [address] Phone: [phone]  Monday     8:30AM-5PM Tuesday 8:30AM-5PM Wednesday 8:30AM-5PM Thursday 8:30AM-5PM Friday  8:30AM-5PM Saturday Closed Sunday Closed   Fetal Movement Counts Patient Name: ________________________________________________ Patient Due Date: ____________________ What is a fetal movement count?  A fetal movement count is the number of times that you feel your baby move during a certain amount of time. This may also be called a fetal kick count. A fetal movement count is recommended for every pregnant woman. You may be asked to start counting fetal movements as early as week 28 of your pregnancy. Pay attention to when your baby is most active. You may notice your baby's sleep and wake cycles. You may also notice things that make your baby move more. You should do a fetal movement count:  When your baby is normally most active.  At the same time each day. A good time to count movements is while you are resting, after having something to eat and drink. How do I count fetal movements? 1. Find a quiet, comfortable area. Sit, or lie down on your side. 2. Write down the date, the start time and stop time, and the number of movements that you felt between those two times. Take this information with you to your health care visits. 3. For 2 hours, count kicks, flutters, swishes, rolls, and jabs. You should feel at least 10 movements during 2 hours. 4. You may stop counting after you have felt 10 movements. 5. If you do not feel 10 movements in 2 hours, have something to eat and drink. Then, keep resting and counting for 1 hour. If you feel at least 4 movements during that hour, you may stop counting. Contact a health care provider if:  You feel fewer than 4 movements in 2 hours.  Your baby is not moving like he or she usually does. Date: ____________ Start time: ____________ Stop time: ____________  Movements: ____________ Date: ____________ Start time: ____________ Stop time: ____________ Movements: ____________ Date: ____________ Start time: ____________ Stop time: ____________ Movements: ____________ Date: ____________ Start time: ____________ Stop time: ____________ Movements: ____________ Date: ____________ Start time: ____________ Stop time: ____________ Movements: ____________ Date: ____________ Start time: ____________ Stop time: ____________ Movements: ____________ Date: ____________ Start time: ____________ Stop time: ____________ Movements: ____________ Date: ____________ Start time: ____________ Stop time: ____________ Movements: ____________ Date: ____________ Start time: ____________ Stop time: ____________ Movements: ____________ This information is not intended to replace advice given to you by your health care provider. Make sure you discuss any questions you have with your health care provider. Document Released: 11/07/2006 Document Revised: 10/28/2018 Document Reviewed: 11/17/2015 Elsevier Patient Education  2020 [employer]  [employer] of the uterus can occur throughout pregnancy, but they are not always a sign that you are in labor. You may have practice contractions called Braxton Hicks contractions. These false labor contractions are sometimes confused with true labor. What are Deberah Pelton contractions? Braxton Hicks contractions are tightening movements that occur in the muscles of the uterus before labor. Unlike true labor contractions, these contractions do not result in opening (dilation) and thinning of the cervix. Toward the end of pregnancy (32-34 weeks), Braxton Hicks contractions can happen more often and may become stronger. These contractions are sometimes difficult to tell apart from true labor because they can be very uncomfortable. You should not feel embarrassed if you go to the hospital with false labor. Sometimes, the  only way to tell if you are in true labor is for your health care provider to look for changes in the cervix. The health care provider will do a physical exam and may monitor your contractions. If you are not in true labor, the exam should show that your cervix is not dilating and your water has not broken. If there are no other health problems associated with your pregnancy, it is completely safe for you to be sent home with false labor. You may continue to have Braxton Hicks contractions until you go into true labor. How to tell the difference between true labor and false labor True labor  Contractions last 30-70 seconds.  Contractions become very regular.  Discomfort is usually felt in the top of the uterus, and it spreads to the lower abdomen and low back.  Contractions do not go away with walking.  Contractions usually become more intense and increase in frequency.  The cervix dilates and gets thinner. False labor  Contractions are usually shorter and not as strong as true labor contractions.  Contractions are usually irregular.  Contractions are often felt in the front of the lower abdomen and in the groin.  Contractions may go away when you walk around or change positions while lying down.  Contractions get weaker and are shorter-lasting as time goes on.  The cervix usually does not dilate or become thin. Follow these instructions at home:   Take over-the-counter and prescription medicines only as told by your health care provider.  Keep up with your usual exercises and follow other instructions from your health care provider.  Eat and drink lightly if you think you are going into labor.  If Braxton Hicks contractions are making you uncomfortable: ? Change your position from lying down or resting to walking, or change from walking to resting. ? Sit and rest in a tub of warm water. ? Drink enough fluid to keep your urine pale yellow. Dehydration may cause these  contractions. ? Do slow and deep breathing several times an hour.  Keep all follow-up prenatal visits as told by your health care provider. This is important. Contact a health care provider if:  You have a fever.  You have continuous pain in your abdomen. Get help right away if:  Your contractions become stronger, more regular, and closer together.  You have fluid leaking or gushing from your vagina.  You pass blood-tinged mucus (bloody show).  You have bleeding from your vagina.  You have low back pain that you never had before.  You feel your baby's head pushing down and causing pelvic pressure.  Your baby is not moving inside you as much as it used to. Summary  Contractions that occur before labor are called Braxton Hicks contractions, false labor, or practice contractions.  Braxton Hicks contractions are usually shorter, weaker, farther apart, and less regular than true labor contractions. True labor contractions usually become progressively stronger and regular, and they become more frequent.  Manage discomfort from [facility] contractions by changing position, resting in a warm bath, drinking plenty of water, or practicing deep breathing. This information is not intended to replace advice given to you by your health care provider. Make sure you discuss any questions you have with your health care provider. Document Released: 02/21/2017 Document Revised: 09/20/2017 Document Reviewed: 02/21/2017 Elsevier Patient Education  2020 [employer].

## 2019-10-02 NOTE — Progress Notes (Signed)
   PRENATAL VISIT NOTE  Subjective:  Sarah [name] is a 28 y.o. G1P0 at [redacted]w[redacted]d being seen today for ongoing prenatal care.  She is currently monitored for the following issues for this high-risk pregnancy and has Encounter for supervision of low-risk first pregnancy, antepartum; Hypothyroidism affecting pregnancy in first trimester; Gallstones; and Birth control counseling on their problem list.  Patient reports multiple gallstone attacks this week. Right sided lower abdominal pain with ambulation.  Contractions: Irritability. Vag. Bleeding: None.  Movement: Present. Denies leaking of fluid.   The following portions of the patient's history were reviewed and updated as appropriate: allergies, current medications, past family history, past medical history, past social history, past surgical history and problem list.   Objective:   Vitals:   10/02/19 1056  BP: 121/79  Pulse: 89  Weight: 185 lb 11.2 oz (84.2 kg)    Fetal Status: Fetal Heart Rate (bpm): 148 Fundal Height: 36 cm Movement: Present     General:  Alert, oriented and cooperative. Patient is in no acute distress.  Skin: Skin is warm and dry. No rash noted.   Cardiovascular: Normal heart rate noted  Respiratory: Normal respiratory effort, no problems with respiration noted  Abdomen: Soft, gravid, appropriate for gestational age.  Pain/Pressure: Present     Pelvic: Cervical exam deferred        Extremities: Normal range of motion.  Edema: Trace  Mental Status: Normal mood and affect. Normal behavior. Normal judgment and thought content.   Assessment and Plan:  Pregnancy: G1P0 at [redacted]w[redacted]d 1. Encounter for supervision of low-risk first pregnancy, antepartum  2. Gallstones - Patient offered option for elective IOL at 39 weeks due to pain, she declines at this time, but will consider and discuss with SO and let us know if she changes her mind - RUQ [place] today appears unchanged   3. Hypothyroidism affecting pregnancy in first  trimester - Third trimester labs on 07/24/19 were normal   4. Round ligament pain - Elastic Bandages & Supports (COMFORT FIT MATERNITY SUPP LG) MISC; 1 Units by Does not apply route daily as needed.  Dispense: 1 each; Refill: 0  Term labor symptoms and general obstetric precautions including but not limited to vaginal bleeding, contractions, leaking of fluid and fetal movement were reviewed in detail with the patient. Please refer to After Visit Summary for other counseling recommendations.   Return in about 1 week (around 10/09/2019) for [place], In-Person.  Future Appointments  Date Time Provider [place]  10/12/2019 10:15 AM Constant, Vickii Chafe, MD [facility] None    Kerry Hough, PA-C

## 2019-10-02 NOTE — Progress Notes (Signed)
Patient reports fetal movement and pressure. 

## 2019-10-05 ENCOUNTER — Other Ambulatory Visit: Payer: Self-pay | Admitting: Advanced Practice Midwife

## 2019-10-05 ENCOUNTER — Encounter: Payer: Self-pay | Admitting: Advanced Practice Midwife

## 2019-10-05 ENCOUNTER — Telehealth: Payer: Self-pay | Admitting: Advanced Practice Midwife

## 2019-10-05 MED ORDER — HYDROCODONE-ACETAMINOPHEN 5-325 MG PO TABS: 1.0000 | ORAL_TABLET | ORAL | 0 refills | Status: DC | PRN

## 2019-10-05 NOTE — Telephone Encounter (Signed)
Talked with pt via MyChart about pregnancy leave. 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  However, due to her recent increase in pain with her gallbladder, I am agreeing that her leave can begin now, at 37 weeks.  She will send FMLA paperwork to the office to complete with new dates.

## 2019-10-05 NOTE — Progress Notes (Signed)
Prescription for Norco 5/325, take 1-2 Q 6 hours PRN x 10 tabs resent to pt preferred pharmacy.

## 2019-10-12 ENCOUNTER — Encounter: Payer: Self-pay | Admitting: Obstetrics and Gynecology

## 2019-10-12 ENCOUNTER — Ambulatory Visit (INDEPENDENT_AMBULATORY_CARE_PROVIDER_SITE_OTHER): Payer: 59 | Admitting: Obstetrics and Gynecology

## 2019-10-12 ENCOUNTER — Other Ambulatory Visit: Payer: Self-pay

## 2019-10-12 VITALS — BP 109/77 | HR 96 | Wt 189.2 lb

## 2019-10-12 DIAGNOSIS — Z3A38 38 weeks gestation of pregnancy: Secondary | ICD-10-CM

## 2019-10-12 DIAGNOSIS — K8020 Calculus of gallbladder without cholecystitis without obstruction: Secondary | ICD-10-CM

## 2019-10-12 DIAGNOSIS — O99283 Endocrine, nutritional and metabolic diseases complicating pregnancy, third trimester: Secondary | ICD-10-CM

## 2019-10-12 DIAGNOSIS — E039 Hypothyroidism, unspecified: Secondary | ICD-10-CM

## 2019-10-12 DIAGNOSIS — Z3400 Encounter for supervision of normal first pregnancy, unspecified trimester: Secondary | ICD-10-CM

## 2019-10-12 NOTE — Progress Notes (Signed)
   PRENATAL VISIT NOTE  Subjective:  Sarah [name] is a 28 y.o. G1P0 at [redacted]w[redacted]d being seen today for ongoing prenatal care.  She is currently monitored for the following issues for this low-risk pregnancy and has Encounter for supervision of low-risk first pregnancy, antepartum; Hypothyroidism affecting pregnancy in first trimester; Gallstones; and Birth control counseling on their problem list.  Patient reports no complaints.  Contractions: Irritability. Vag. Bleeding: None.  Movement: Present. Denies leaking of fluid.   The following portions of the patient's history were reviewed and updated as appropriate: allergies, current medications, past family history, past medical history, past social history, past surgical history and problem list.   Objective:   Vitals:   10/12/19 1032  BP: 109/77  Pulse: 96  Weight: 189 lb 3.2 oz (85.8 kg)    Fetal Status: Fetal Heart Rate (bpm): 150 Fundal Height: 37 cm Movement: Present     General:  Alert, oriented and cooperative. Patient is in no acute distress.  Skin: Skin is warm and dry. No rash noted.   Cardiovascular: Normal heart rate noted  Respiratory: Normal respiratory effort, no problems with respiration noted  Abdomen: Soft, gravid, appropriate for gestational age.  Pain/Pressure: Present     Pelvic: Cervical exam deferred        Extremities: Normal range of motion.  Edema: Trace  Mental Status: Normal mood and affect. Normal behavior. Normal judgment and thought content.   Assessment and Plan:  Pregnancy: G1P0 at [redacted]w[redacted]d 1. Encounter for supervision of low-risk first pregnancy, antepartum Patient is doing well without complaints Discussed IOL at 41 weeks. Will be scheduled at her next appointment  2. Hypothyroidism affecting pregnancy in first trimester Normal labs in third trimester  3. Gallstones Pain unchanged from previous  Term labor symptoms and general obstetric precautions including but not limited to vaginal bleeding,  contractions, leaking of fluid and fetal movement were reviewed in detail with the patient. Please refer to After Visit Summary for other counseling recommendations.   Return in about 1 week (around 10/19/2019) for in person, ROB, cervical check.  No future appointments.  Mora Bellman, MD

## 2019-10-12 NOTE — Progress Notes (Signed)
Pt is here for ROB, [redacted]w[redacted]d. 

## 2019-10-20 ENCOUNTER — Other Ambulatory Visit: Payer: Self-pay

## 2019-10-20 ENCOUNTER — Ambulatory Visit (INDEPENDENT_AMBULATORY_CARE_PROVIDER_SITE_OTHER): Payer: 59 | Admitting: Family Medicine

## 2019-10-20 ENCOUNTER — Encounter: Payer: Self-pay | Admitting: Family Medicine

## 2019-10-20 VITALS — BP 114/79 | HR 87 | Wt 189.0 lb

## 2019-10-20 DIAGNOSIS — O99281 Endocrine, nutritional and metabolic diseases complicating pregnancy, first trimester: Secondary | ICD-10-CM

## 2019-10-20 DIAGNOSIS — Z3A39 39 weeks gestation of pregnancy: Secondary | ICD-10-CM

## 2019-10-20 DIAGNOSIS — K8020 Calculus of gallbladder without cholecystitis without obstruction: Secondary | ICD-10-CM

## 2019-10-20 DIAGNOSIS — E039 Hypothyroidism, unspecified: Secondary | ICD-10-CM

## 2019-10-20 DIAGNOSIS — Z3400 Encounter for supervision of normal first pregnancy, unspecified trimester: Secondary | ICD-10-CM

## 2019-10-20 NOTE — Progress Notes (Signed)
   PRENATAL VISIT NOTE  Subjective:  Sarah [name] is a 28 y.o. G1P0 at [redacted]w[redacted]d being seen today for ongoing prenatal care.  She is currently monitored for the following issues for this high-risk pregnancy and has Encounter for supervision of low-risk first pregnancy, antepartum; Hypothyroidism affecting pregnancy in first trimester; Gallstones; and Birth control counseling on their problem list.  Patient reports continues to have GB colic. Marland Kitchen  Contractions: Irritability. Vag. Bleeding: None.  Movement: Present. Denies leaking of fluid.   The following portions of the patient's history were reviewed and updated as appropriate: allergies, current medications, past family history, past medical history, past social history, past surgical history and problem list.   Objective:   Vitals:   10/20/19 1622  BP: 114/79  Pulse: 87  Weight: 189 lb (85.7 kg)    Fetal Status: Fetal Heart Rate (bpm): 150 Fundal Height: 38 cm Movement: Present  Presentation: Vertex Chaperone present during exam General:  Alert, oriented and cooperative. Patient is in no acute distress.  Skin: Skin is warm and dry. No rash noted.   Cardiovascular: Normal heart rate noted  Respiratory: Normal respiratory effort, no problems with respiration noted  Abdomen: Soft, gravid, appropriate for gestational age.  Pain/Pressure: Present     Pelvic: Cervical exam performed Dilation: Fingertip Effacement (%): 60 Station: -2  Extremities: Normal range of motion.  Edema: Trace  Mental Status: Normal mood and affect. Normal behavior. Normal judgment and thought content.   Assessment and Plan:  Pregnancy: G1P0 at [redacted]w[redacted]d 1. Encounter for supervision of low-risk first pregnancy, antepartum FHT and FH normal. Induction at 41 weeks. Will place foley balloon in office - recommend speculum for placement  2. Hypothyroidism affecting pregnancy in first trimester  3. Gallstones   Preterm labor symptoms and general obstetric precautions  including but not limited to vaginal bleeding, contractions, leaking of fluid and fetal movement were reviewed in detail with the patient. Please refer to After Visit Summary for other counseling recommendations.   Return in 1 week (on 10/27/2019) for In Office, OB f/u - foley ballon insertion.  No future appointments.  Truett Mainland, DO

## 2019-10-20 NOTE — Progress Notes (Signed)
ROB   Pt request cervix check.   Pt notes 3 Gall bladder attacks recently.

## 2019-10-21 ENCOUNTER — Telehealth (HOSPITAL_COMMUNITY): Payer: Self-pay | Admitting: *Deleted

## 2019-10-21 NOTE — Telephone Encounter (Signed)
Preadmission screen  

## 2019-10-22 ENCOUNTER — Telehealth (HOSPITAL_COMMUNITY): Payer: Self-pay | Admitting: *Deleted

## 2019-10-22 NOTE — Telephone Encounter (Signed)
Preadmission screen  

## 2019-10-23 ENCOUNTER — Other Ambulatory Visit: Payer: Self-pay | Admitting: Advanced Practice Midwife

## 2019-10-23 NOTE — L&D Delivery Note (Addendum)
OB/GYN Faculty Practice Delivery Note  Sarah [name] is a 29 y.o. G1P0 s/p spontaneous vaginal at [redacted]w[redacted]d. She was admitted for worsening cholelithiasis and IOL for pre-e without SF.   GBS Status: Negative/-- (12/04 1049) Maximum Maternal Temperature: Temp (24hrs), Avg:98.6 F (37 C), Min:98 F (36.7 C), Max:99.6 F (37.6 C)  Labor Progress: Admitted for IOL for pre-e without severe features FB, pitocin with AROM 3h 31m prior to delivery with clear fluid  Pitocin until complete dilation achieved.   Delivery Date/Time: 10/25/2019 at 0126 Delivery: Called to room and patient was complete and pushing. Head delivered LOA.  No nuchal cord present. . Shoulder and body delivered in usual fashion. Infant with spontaneous cry, placed skin to skin on mother's abdomen, dried and stimulated. Cord clamped x 2 after 1-minute delay, and cut by delivering provider. Cord blood drawn. Placenta delivered spontaneously with gentle cord traction. Fundus firm with massage and Pitocin. Labia, perineum, vagina, and cervix inspected with 2nd degree perineal required repair with 3.0, vicryl. .   Placenta: spontaneous , intact  Complications: pre e without severe features EBL: 429 mL  Analgesia: Epidural anesthesia  Postpartum Planning Mom and baby to mother/baby.  Lactation consult AM CBC Contraception condoms  Circ n/a  Social work: n/a   Infant: Viable female  APGAR: 8/9  weight not yet available  [name], M.D.  10/25/2019 1:52 AM   I attest that I was gowned and gloved for the delivery of infant, placenta and repair. I agree to the above documentation and findings.  Luna Kitchens

## 2019-10-24 ENCOUNTER — Inpatient Hospital Stay (HOSPITAL_COMMUNITY): Payer: 59 | Admitting: Anesthesiology

## 2019-10-24 ENCOUNTER — Other Ambulatory Visit: Payer: Self-pay

## 2019-10-24 ENCOUNTER — Encounter (HOSPITAL_COMMUNITY): Payer: Self-pay | Admitting: Obstetrics and Gynecology

## 2019-10-24 ENCOUNTER — Inpatient Hospital Stay (HOSPITAL_COMMUNITY)
Admission: AD | Admit: 2019-10-24 | Discharge: 2019-10-27 | DRG: 807 | Disposition: A | Discharge status: Home / Self Care | Payer: 59 | Attending: Obstetrics & Gynecology | Admitting: Obstetrics & Gynecology

## 2019-10-24 DIAGNOSIS — R1011 Right upper quadrant pain: Secondary | ICD-10-CM | POA: Diagnosis present

## 2019-10-24 DIAGNOSIS — O2662 Liver and biliary tract disorders in childbirth: Secondary | ICD-10-CM | POA: Diagnosis present

## 2019-10-24 DIAGNOSIS — O139 Gestational [pregnancy-induced] hypertension without significant proteinuria, unspecified trimester: Secondary | ICD-10-CM

## 2019-10-24 DIAGNOSIS — O1404 Mild to moderate pre-eclampsia, complicating childbirth: Principal | ICD-10-CM | POA: Diagnosis present

## 2019-10-24 DIAGNOSIS — Z3A40 40 weeks gestation of pregnancy: Secondary | ICD-10-CM | POA: Diagnosis not present

## 2019-10-24 DIAGNOSIS — O9962 Diseases of the digestive system complicating childbirth: Secondary | ICD-10-CM | POA: Diagnosis not present

## 2019-10-24 DIAGNOSIS — Z20822 Contact with and (suspected) exposure to covid-19: Secondary | ICD-10-CM | POA: Diagnosis present

## 2019-10-24 DIAGNOSIS — O26893 Other specified pregnancy related conditions, third trimester: Secondary | ICD-10-CM | POA: Diagnosis present

## 2019-10-24 DIAGNOSIS — O99284 Endocrine, nutritional and metabolic diseases complicating childbirth: Secondary | ICD-10-CM | POA: Diagnosis present

## 2019-10-24 DIAGNOSIS — E039 Hypothyroidism, unspecified: Secondary | ICD-10-CM | POA: Diagnosis present

## 2019-10-24 DIAGNOSIS — E669 Obesity, unspecified: Secondary | ICD-10-CM | POA: Diagnosis present

## 2019-10-24 DIAGNOSIS — O99214 Obesity complicating childbirth: Secondary | ICD-10-CM | POA: Diagnosis present

## 2019-10-24 DIAGNOSIS — R21 Rash and other nonspecific skin eruption: Secondary | ICD-10-CM | POA: Diagnosis present

## 2019-10-24 DIAGNOSIS — K8020 Calculus of gallbladder without cholecystitis without obstruction: Secondary | ICD-10-CM

## 2019-10-24 DIAGNOSIS — Z362 Encounter for other antenatal screening follow-up: Secondary | ICD-10-CM | POA: Diagnosis not present

## 2019-10-24 DIAGNOSIS — Z3689 Encounter for other specified antenatal screening: Secondary | ICD-10-CM

## 2019-10-24 HISTORY — DX: Gestational (pregnancy-induced) hypertension without significant proteinuria, unspecified trimester: O13.9

## 2019-10-24 LAB — COMPREHENSIVE METABOLIC PANEL
ALT: 49 U/L — ABNORMAL HIGH (ref 0–44)
AST: 54 U/L — ABNORMAL HIGH (ref 15–41)
Albumin: 3 g/dL — ABNORMAL LOW (ref 3.5–5.0)
Alkaline Phosphatase: 146 U/L — ABNORMAL HIGH (ref 38–126)
Anion gap: 14 (ref 5–15)
BUN: 6 mg/dL (ref 6–20)
CO2: 21 mmol/L — ABNORMAL LOW (ref 22–32)
Calcium: 9.4 mg/dL (ref 8.9–10.3)
Chloride: 102 mmol/L (ref 98–111)
Creatinine, Ser: 0.74 mg/dL (ref 0.44–1.00)
GFR calc Af Amer: >60 mL/min (ref >60)
GFR calc non Af Amer: >60 mL/min (ref >60)
Glucose, Bld: 73 mg/dL (ref 70–99)
Potassium: 3.8 mmol/L (ref 3.5–5.1)
Sodium: 137 mmol/L (ref 135–145)
Total Bilirubin: 1.1 mg/dL (ref 0.3–1.2)
Total Protein: 6.2 g/dL — ABNORMAL LOW (ref 6.5–8.1)

## 2019-10-24 LAB — CBC
HCT: 38.1 % (ref 36.0–46.0)
HCT: 37.7 % (ref 36.0–46.0)
Hemoglobin: 12.8 g/dL (ref 12.0–15.0)
Hemoglobin: 12.9 g/dL (ref 12.0–15.0)
MCH: 29.5 pg (ref 26.0–34.0)
MCH: 30.4 pg (ref 26.0–34.0)
MCHC: 33.6 g/dL (ref 30.0–36.0)
MCHC: 34.2 g/dL (ref 30.0–36.0)
MCV: 87.8 fL (ref 80.0–100.0)
MCV: 88.7 fL (ref 80.0–100.0)
Platelets: 211 10*3/uL (ref 150–400)
Platelets: 197 10*3/uL (ref 150–400)
RBC: 4.34 MIL/uL (ref 3.87–5.11)
RBC: 4.25 MIL/uL (ref 3.87–5.11)
RDW: 12.7 % (ref 11.5–15.5)
RDW: 12.8 % (ref 11.5–15.5)
WBC: 7.5 10*3/uL (ref 4.0–10.5)
WBC: 11.4 10*3/uL — ABNORMAL HIGH (ref 4.0–10.5)
nRBC: 0 % (ref 0.0–0.2)
nRBC: 0 % (ref 0.0–0.2)

## 2019-10-24 LAB — PROTEIN / CREATININE RATIO, URINE
Creatinine, Urine: 84.32 mg/dL
Protein Creatinine Ratio: 0.47 mg/mg{Cre} — ABNORMAL HIGH (ref 0.00–0.15)
Total Protein, Urine: 40 mg/dL

## 2019-10-24 LAB — TYPE AND SCREEN
ABO/RH(D): O POS
Antibody Screen: NEGATIVE

## 2019-10-24 LAB — RPR: RPR Ser Ql: NONREACTIVE

## 2019-10-24 LAB — SARS CORONAVIRUS 2 (TAT 6-24 HRS): SARS Coronavirus 2: NEGATIVE

## 2019-10-24 MED ORDER — DIPHENHYDRAMINE HCL 50 MG/ML IJ SOLN: 12.5000 mg | INTRAMUSCULAR | Status: DC | PRN

## 2019-10-24 MED ORDER — LACTATED RINGERS IV SOLN: 500.0000 mL | Freq: Once | INTRAVENOUS | Status: DC

## 2019-10-24 MED ORDER — EPHEDRINE 5 MG/ML INJ: 10.0000 mg | INTRAVENOUS | Status: DC | PRN

## 2019-10-24 MED ORDER — LACTATED RINGERS IV SOLN: 500.0000 mL | INTRAVENOUS | Status: DC | PRN

## 2019-10-24 MED ORDER — SOD CITRATE-CITRIC ACID 500-334 MG/5ML PO SOLN: 30.0000 mL | ORAL | Status: DC | PRN

## 2019-10-24 MED ORDER — OXYCODONE-ACETAMINOPHEN 5-325 MG PO TABS: 2.0000 | ORAL_TABLET | ORAL | Status: DC | PRN

## 2019-10-24 MED ORDER — LIDOCAINE HCL (PF) 1 % IJ SOLN: 30.0000 mL | INTRAMUSCULAR | Status: DC | PRN

## 2019-10-24 MED ORDER — ONDANSETRON HCL 4 MG/2ML IJ SOLN
4.0000 mg | Freq: Four times a day (QID) | INTRAMUSCULAR | Status: DC | PRN
  Administered 2019-10-24: 4 mg via INTRAVENOUS
  Filled 2019-10-24: qty 2

## 2019-10-24 MED ORDER — LACTATED RINGERS IV SOLN: INTRAVENOUS | Status: DC

## 2019-10-24 MED ORDER — LIDOCAINE HCL (PF) 1 % IJ SOLN
INTRAMUSCULAR | Status: DC | PRN
  Administered 2019-10-24: 5 mL via EPIDURAL
  Administered 2019-10-24: 3 mL via EPIDURAL
  Administered 2019-10-24: 2 mL via EPIDURAL

## 2019-10-24 MED ORDER — OXYTOCIN 40 UNITS IN NORMAL SALINE INFUSION - SIMPLE MED
2.5000 [IU]/h | INTRAVENOUS | Status: DC
  Filled 2019-10-24: qty 1000

## 2019-10-24 MED ORDER — FENTANYL-BUPIVACAINE-NACL 0.5-0.125-0.9 MG/250ML-% EP SOLN
12.0000 mL/h | EPIDURAL | Status: DC | PRN
  Filled 2019-10-24: qty 250

## 2019-10-24 MED ORDER — FENTANYL CITRATE (PF) 100 MCG/2ML IJ SOLN
INTRAMUSCULAR | Status: AC
  Filled 2019-10-24: qty 2

## 2019-10-24 MED ORDER — OXYTOCIN 40 UNITS IN NORMAL SALINE INFUSION - SIMPLE MED
1.0000 m[IU]/min | INTRAVENOUS | Status: DC
  Administered 2019-10-24: 2 m[IU]/min via INTRAVENOUS

## 2019-10-24 MED ORDER — FENTANYL CITRATE (PF) 100 MCG/2ML IJ SOLN
100.0000 ug | INTRAMUSCULAR | Status: DC | PRN
  Administered 2019-10-24 (×4): 100 ug via INTRAVENOUS
  Filled 2019-10-24 (×3): qty 2

## 2019-10-24 MED ORDER — TERBUTALINE SULFATE 1 MG/ML IJ SOLN: 0.2500 mg | Freq: Once | INTRAMUSCULAR | Status: DC | PRN

## 2019-10-24 MED ORDER — PHENYLEPHRINE 40 MCG/ML (10ML) SYRINGE FOR IV PUSH (FOR BLOOD PRESSURE SUPPORT): 80.0000 ug | PREFILLED_SYRINGE | INTRAVENOUS | Status: DC | PRN

## 2019-10-24 MED ORDER — THYROID 30 MG PO TABS
30.0000 mg | ORAL_TABLET | Freq: Every day | ORAL | Status: DC
  Administered 2019-10-25 – 2019-10-27 (×3): 30 mg via ORAL
  Filled 2019-10-24 (×3): qty 1

## 2019-10-24 MED ORDER — SODIUM CHLORIDE (PF) 0.9 % IJ SOLN
INTRAMUSCULAR | Status: DC | PRN
  Administered 2019-10-24: 12 mL/h via EPIDURAL

## 2019-10-24 MED ORDER — OXYTOCIN BOLUS FROM INFUSION
500.0000 mL | Freq: Once | INTRAVENOUS | Status: AC
  Administered 2019-10-25: 500 mL via INTRAVENOUS

## 2019-10-24 MED ORDER — ACETAMINOPHEN 325 MG PO TABS
650.0000 mg | ORAL_TABLET | ORAL | Status: DC | PRN
  Administered 2019-10-25: 650 mg via ORAL
  Filled 2019-10-24: qty 2

## 2019-10-24 MED ORDER — OXYCODONE-ACETAMINOPHEN 5-325 MG PO TABS: 1.0000 | ORAL_TABLET | ORAL | Status: DC | PRN

## 2019-10-24 NOTE — MAU Note (Signed)
Patient reports to MAU c/o gallbladder attacks. Pt states she has abdominal pain, back pain, and chest pain. +FM. No bleeding or LOF. Pt reports feeling nauseated with the attacks and reports right foot swelling.   Pt reports concerns about having a severe HA and loss of vision on Christmas Eve and on the 30th of December. Pt reports her arms also went numb and she had a increase in swelling. Pt reports she reported it to the office but they told her it was probably a migraine.

## 2019-10-24 NOTE — Anesthesia Preprocedure Evaluation (Signed)
Anesthesia Evaluation  Patient identified by MRN, date of birth, ID band Patient awake    Reviewed: Allergy & Precautions, NPO status , Patient's Chart, lab work & pertinent test results  Airway Mallampati: II  TM Distance: >3 FB Neck ROM: Full    Dental  (+) Teeth Intact, Dental Advisory Given   Pulmonary neg pulmonary ROS,    Pulmonary exam normal breath sounds clear to auscultation       Cardiovascular hypertension (Pre-eclampsia), Normal cardiovascular exam Rhythm:Regular Rate:Normal     Neuro/Psych PSYCHIATRIC DISORDERS Anxiety negative neurological ROS     GI/Hepatic negative GI ROS, Gall stones    Endo/Other  Hypothyroidism Obesity   Renal/GU negative Renal ROS     Musculoskeletal negative musculoskeletal ROS (+)   Abdominal   Peds  Hematology Plt 197k   Anesthesia Other Findings Day of surgery medications reviewed with the patient.  Reproductive/Obstetrics (+) Pregnancy                             Anesthesia Physical Anesthesia Plan  ASA: III  Anesthesia Plan: Epidural   Post-op Pain Management:    Induction:   PONV Risk Score and Plan: 2 and Treatment may vary due to age or medical condition  Airway Management Planned: Natural Airway  Additional Equipment:   Intra-op Plan:   Post-operative Plan:   Informed Consent: I have reviewed the patients History and Physical, chart, labs and discussed the procedure including the risks, benefits and alternatives for the proposed anesthesia with the patient or authorized representative who has indicated his/her understanding and acceptance.     Dental advisory given  Plan Discussed with:   Anesthesia Plan Comments: (Patient identified. Risks/Benefits/Options discussed with patient including but not limited to bleeding, infection, nerve damage, paralysis, failed block, incomplete pain control, headache, blood pressure  changes, nausea, vomiting, reactions to medication both or allergic, itching and postpartum back pain. Confirmed with bedside nurse the patient's most recent platelet count. Confirmed with patient that they are not currently taking any anticoagulation, have any bleeding history or any family history of bleeding disorders. Patient expressed understanding and wished to proceed. All questions were answered. )        Anesthesia Quick Evaluation

## 2019-10-24 NOTE — Progress Notes (Addendum)
OB/GYN Faculty Practice: Labor Progress Note  Subjective:  Doing well. Some contraction pain   Objective:  BP 140/85   Pulse 77   Temp 98.4 F (36.9 C) (Oral)   Resp 20   LMP 01/14/2019   SpO2 99%  Gen: Lying in bed comfortably. NAD.  Extremities: No signs of DVT.   CE: Dilation: 4.5 Effacement (%): 80 Cervical Position: Posterior Station: -1 Presentation: Vertex Exam by:: [name], MD Contractions: regular q84minutes FH: BL 140, + var, + a, +d early.   Assessment and Plan:  Ravan Knecht is a 29 y.o. G1P0 at [redacted]w[redacted]d - IOL for GHTN/Pre-E w/o SF (UPC 0.47) and worsening cholelithiasis.   Labor: s/p FB, pitocin at 8 mu/min since 1430. AROM at 2130 Pain control:  anticipate epidural Anticipated MOD: NSVD PPH Risk: augmentation, pre-e, primip  Fetal Wellbeing: Cat 1 tracing GBS Negative/-- (12/04 1049)  Continuous fetal monitoring  [name], M.D.  Family Medicine  PGY-1 10/24/2019 10:53 PM  I confirm that I have verified the information documented in the resident's note and that I have also personally reperformed the history, physical exam and all medical decision making activities of this service and have verified that all service and findings are accurately documented in this student's note.    Marylene Land, CNM 10/24/2019 11:54 PM

## 2019-10-24 NOTE — H&P (Signed)
Sarah [name] is a 29 y.o. female at [redacted]w[redacted]d presenting for pain in RUQ and accompanying back and chest pain.  Similar to prior gallbladder attacks. Has started having pain every time she eats this week.   Was offered an IOL at 39 weeks due to recurrent gallbladder attacks but previously declined.  WIth the pain tonight and new elevations in blood pressure, we offered IOL tonight and she agrees.  Marland Kitchen  Has hypothyroidism which is well controlled  RN note: Patient reports to [facility] c/o gallbladder attacks. Pt states she has abdominal pain, back pain, and chest pain. +FM. No bleeding or LOF. Pt reports feeling nauseated with the attacks and reports right foot swelling.   Pt reports concerns about having a severe HA and loss of vision on Christmas Eve and on the 30th of December. Pt reports her arms also went numb and she had a increase in swelling. Pt reports she reported it to the office but they told her it was probably a migraine.   OB History    Gravida  1   Para      Term      Preterm      AB      Living  0     SAB      TAB      Ectopic      Multiple      Live Births             Past Medical History:  Diagnosis Date  . Anxiety   . Bell's palsy   . Gall stones   . Hypothyroidism   . Ovarian cyst   . Thyroid disease    Past Surgical History:  Procedure Laterality Date  . NO PAST SURGERIES     Family History: family history includes Cancer in her maternal grandmother; Hashimoto's thyroiditis in her mother; Hypertension in her mother. Social History:  reports that she has never smoked. She has never used smokeless tobacco. She reports previous alcohol use. She reports that she does not use drugs.     Maternal Diabetes: No Genetic Screening: Normal Maternal Ultrasounds/Referrals: Normal Fetal Ultrasounds or other Referrals:  None Maternal Substance Abuse:  No Significant Maternal Medications:  Meds include: Other:  Thyroid supplement and Norco for  pain Significant Maternal Lab Results:  Group B Strep negative Other Comments:  None  Review of Systems  Constitutional: Negative for chills and fever.  Respiratory: Negative for shortness of breath.   Gastrointestinal: Positive for abdominal pain (RUQ) and nausea. Negative for constipation and diarrhea.  Genitourinary: Negative for vaginal bleeding and vaginal discharge.  Musculoskeletal: Negative for back pain.  Neurological: Negative for dizziness.   Maternal Medical History:  Reason for admission: Nausea.  Increasing gallbladder attacks and new Gestational Hypertension   Contractions: Onset was 1-2 hours ago.    Fetal activity: Perceived fetal activity is normal.   Last perceived fetal movement was within the past hour.    Prenatal complications: PIH.   No bleeding, placental abnormality, pre-eclampsia or preterm labor.   Prenatal Complications - Diabetes: none.      Blood pressure 140/85, pulse 79, temperature (!) 97.4 F (36.3 C), temperature source Oral, resp. rate 18, last menstrual period 01/14/2019.  Vitals:   10/24/19 0104 10/24/19 0115 10/24/19 0130 10/24/19 0145  BP: 132/85 123/76 131/89 140/85  Pulse: 68 73 81 79  Resp:      Temp:      TempSrc:        Maternal  Exam:  Uterine Assessment: Contraction strength is mild.  Contraction frequency is irregular.   Abdomen: Patient reports the following abdominal tenderness: RUQ.  Fetal presentation: vertex  Introitus: Normal vulva. Normal vagina.  Ferning test: not done.  Nitrazine test: not done.  Pelvis: adequate for delivery.   Cervix: Cervix evaluated by digital exam.     Fetal Exam Fetal Monitor Review: Mode: ultrasound.   Baseline rate: 140.  Variability: moderate (6-25 bpm).   Pattern: accelerations present and no decelerations.    Fetal State Assessment: Category I - tracings are normal.     Physical Exam  Constitutional: She is oriented to person, place, and time. She appears  well-developed and well-nourished. No distress.  HENT:  Head: Normocephalic.  Cardiovascular: Normal rate and regular rhythm.  Respiratory: Effort normal. No respiratory distress.  GI: Soft. There is abdominal tenderness in the right upper quadrant. There is no rebound and no guarding.  Genitourinary:    Vulva normal.     Genitourinary Comments: Dilation: 1 Effacement (%): 80 Presentation: Vertex Exam by:: Hansel Feinstein, CNM    Musculoskeletal:        General: Normal range of motion.  Neurological: She is alert and oriented to person, place, and time.  Skin: Skin is warm and dry.  Psychiatric: She has a normal mood and affect.    Prenatal labs: ABO, Rh: O/Positive/-- (06/09 1525) Antibody: Negative (06/09 1525) Rubella: 5.07 (06/09 1525) RPR: Non Reactive (10/02 0857)  HBsAg: Negative (06/09 1525)  HIV: Non Reactive (10/02 0857)  GBS: Negative/-- (12/04 1049)   Assessment/Plan: Single intrauterine pregnancy at [redacted]w[redacted]d Cholelithiasis Gestational Hypertension  Admit to Labor and Delivery Routine orders Induction of labor Foley inserted Will start Pitocin 2x2 up to 32mu/min, then hold until balloon comes out    Hansel Feinstein 10/24/2019, 1:58 AM

## 2019-10-24 NOTE — Progress Notes (Signed)
Pt informed that the ultrasound is considered a limited OB ultrasound and is not intended to be a complete ultrasound exam.  Patient also informed that the ultrasound is not being completed with the intent of assessing for fetal or placental anomalies or any pelvic abnormalities.  Explained that the purpose of today's ultrasound is to assess for  presentation.  Patient acknowledges the purpose of the exam and the limitations of the study.    Cephalic  Genieve Ramaswamy, NP   

## 2019-10-24 NOTE — Anesthesia Procedure Notes (Signed)
Epidural Patient location during procedure: OB Start time: 10/24/2019 10:03 PM End time: 10/24/2019 10:10 PM  Staffing Anesthesiologist: Cecile Hearing, MD Performed: anesthesiologist   Preanesthetic Checklist Completed: patient identified, IV checked, risks and benefits discussed, monitors and equipment checked, pre-op evaluation and timeout performed  Epidural Patient position: sitting Prep: DuraPrep Patient monitoring: blood pressure and continuous pulse ox Approach: midline Location: L3-L4 Injection technique: LOR air  Needle:  Needle type: Tuohy  Needle gauge: 17 G Needle length: 9 cm Needle insertion depth: 6 cm Catheter size: 19 Gauge Catheter at skin depth: 11 cm Test dose: negative and Other (1% Lidocaine)  Additional Notes Patient identified.  Risk benefits discussed including failed block, incomplete pain control, headache, nerve damage, paralysis, blood pressure changes, nausea, vomiting, reactions to medication both toxic or allergic, and postpartum back pain.  Patient expressed understanding and wished to proceed.  All questions were answered.  Sterile technique used throughout procedure and epidural site dressed with sterile barrier dressing. No paresthesia or other complications noted. The patient did not experience any signs of intravascular injection such as tinnitus or metallic taste in mouth nor signs of intrathecal spread such as rapid motor block. Please see nursing notes for vital signs. Reason for block:procedure for pain

## 2019-10-24 NOTE — Progress Notes (Signed)
   Sarah [name] is a 29 y.o. G1P0 at [redacted]w[redacted]d  admitted for IOL for  Pre-e without severe features (PCR was 0.47) and worsening cholelithiasis.   Subjective: Coping well with epidural; she denies HA, blurry vision, RUQ pain, floating spots or other signs of pre-e  Objective: Vitals:   10/24/19 2230 10/24/19 2235 10/24/19 2240 10/24/19 2254  BP: 131/88 (!) 146/89 140/85   Pulse: 90 83 77   Resp:      Temp:      TempSrc:      SpO2: 100% 100% 99% 100%   No intake/output data recorded.  FHT:  FHR: 135 bpm, variability: moderate,  accelerations:  Present,  decelerations:  Present occasional earlys and variables. UC:   irregular, every 2-3 minutes SVE:   Dilation: 4.5 Effacement (%): 80 Station: -1 Exam by:: [name], MD Pitocin @ 12 mu/min  Labs: Lab Results  Component Value Date   WBC 11.4 (H) 10/24/2019   HGB 12.9 10/24/2019   HCT 37.7 10/24/2019   MCV 88.7 10/24/2019   PLT 197 10/24/2019    Assessment / Plan: Comfortable with epidural; will recheck  If patient starts feeling more pressure.   Labor: doing well Fetal Wellbeing:  Category I Pain Control:  Epidural Anticipated MOD:  NSVD  Charlesetta Garibaldi Sarah [name] 10/24/2019, 11:15 PM

## 2019-10-24 NOTE — Progress Notes (Signed)
Sarah [name] is a 29 y.o. G1P0 at [redacted]w[redacted]d admitted for IOL 2/2 gallstones  Subjective: Pitocin off since 0510, contractions just starting to become painful.  Objective: BP 124/86   Pulse 82   Temp 98.5 F (36.9 C) (Oral)   Resp 18   LMP 01/14/2019  No intake/output data recorded.  FHT:  FHR: 140 bpm, variability: moderate,  accelerations:  Present,  decelerations:  Absent UC:   regular, every 2-3 minutes  SVE:   Dilation: 4 Effacement (%): 80 Station: -1 Exam by:: Foye Clock RN  Labs: Lab Results  Component Value Date   WBC 7.5 10/24/2019   HGB 12.8 10/24/2019   HCT 38.1 10/24/2019   MCV 87.8 10/24/2019   PLT 211 10/24/2019    Assessment / Plan: 29 yo G1P0 at 40.3 EGA here for IOL 2/2 gallstones  Labor: S/p FB. Was on 2 of pit until 0510- turned off because she was contracting regularly. Making some cervical change by herself. Continue to monitor, may add pitocin or AROM as needed. Fetal Wellbeing:  Category I Pain Control:  per patient request Pre-eclampsia: P:Cr 0.47. Had some mildly elevated BPs, now normotensive. I/D:  negative Anticipated MOD:  vaginal delivery  Margarette Asal Andrew Blasius DO OB Fellow, Faculty Practice 10/24/2019, 10:36 AM

## 2019-10-24 NOTE — Progress Notes (Signed)
Sarah [name] is a 29 y.o. G1P0 at [redacted]w[redacted]d admitted for IOL 2/2 gallstones, now with Pre-E w/o sf  Subjective: No complaints. No real cervical change since last exam.  Objective: BP (!) 119/94   Pulse 62   Temp 98.7 F (37.1 C) (Oral)   Resp 18   LMP 01/14/2019  No intake/output data recorded.  FHT:  FHR: 135 bpm, variability: moderate,  accelerations:  Present,  decelerations:  Absent UC:   irregular, every 2-5 minutes  SVE:   Dilation: 4 Effacement (%): 80 Station: -1 Exam by:: Sarah Clock RN  Labs: Lab Results  Component Value Date   WBC 7.5 10/24/2019   HGB 12.8 10/24/2019   HCT 38.1 10/24/2019   MCV 87.8 10/24/2019   PLT 211 10/24/2019    Assessment / Plan: 29 yo G1P0 at 40.3 EGA here for IOL 2/2 gallstones, now with Pre-E w/o SF  Labor: S/p FB. Was on 2 of pit until 0510- turned off because she was contracting regularly. Made some cervical change by herself earlier, now with no more cervical change. Restart pitocin. Fetal Wellbeing:  Category I Pain Control:  per patient request Pre-eclampsia: P:Cr 0.47. Had some mildly elevated BPs, now normotensive. I/D:  negative Anticipated MOD:  vaginal delivery  Sarah Asal [name] DO OB Fellow, Faculty Practice 10/24/2019, 2:49 PM

## 2019-10-25 ENCOUNTER — Encounter (HOSPITAL_COMMUNITY): Payer: Self-pay | Admitting: Obstetrics and Gynecology

## 2019-10-25 DIAGNOSIS — K8020 Calculus of gallbladder without cholecystitis without obstruction: Secondary | ICD-10-CM

## 2019-10-25 DIAGNOSIS — O9962 Diseases of the digestive system complicating childbirth: Secondary | ICD-10-CM

## 2019-10-25 DIAGNOSIS — O1404 Mild to moderate pre-eclampsia, complicating childbirth: Secondary | ICD-10-CM

## 2019-10-25 DIAGNOSIS — Z3A40 40 weeks gestation of pregnancy: Secondary | ICD-10-CM

## 2019-10-25 LAB — CBC
HCT: 33 % — ABNORMAL LOW (ref 36.0–46.0)
Hemoglobin: 11.4 g/dL — ABNORMAL LOW (ref 12.0–15.0)
MCH: 29.8 pg (ref 26.0–34.0)
MCHC: 34.5 g/dL (ref 30.0–36.0)
MCV: 86.2 fL (ref 80.0–100.0)
Platelets: 169 10*3/uL (ref 150–400)
RBC: 3.83 MIL/uL — ABNORMAL LOW (ref 3.87–5.11)
RDW: 12.7 % (ref 11.5–15.5)
WBC: 15 10*3/uL — ABNORMAL HIGH (ref 4.0–10.5)
nRBC: 0 % (ref 0.0–0.2)

## 2019-10-25 MED ORDER — PRENATAL MULTIVITAMIN CH
1.0000 | ORAL_TABLET | Freq: Every day | ORAL | Status: DC
  Administered 2019-10-25 – 2019-10-27 (×3): 1 via ORAL
  Filled 2019-10-25 (×3): qty 1

## 2019-10-25 MED ORDER — WITCH HAZEL-GLYCERIN EX PADS: 1.0000 "application " | MEDICATED_PAD | CUTANEOUS | Status: DC | PRN

## 2019-10-25 MED ORDER — IBUPROFEN 600 MG PO TABS
600.0000 mg | ORAL_TABLET | Freq: Four times a day (QID) | ORAL | Status: DC
  Administered 2019-10-25 – 2019-10-27 (×10): 600 mg via ORAL
  Filled 2019-10-25 (×10): qty 1

## 2019-10-25 MED ORDER — DIBUCAINE (PERIANAL) 1 % EX OINT: 1.0000 "application " | TOPICAL_OINTMENT | CUTANEOUS | Status: DC | PRN

## 2019-10-25 MED ORDER — TETANUS-DIPHTH-ACELL PERTUSSIS 5-2.5-18.5 LF-MCG/0.5 IM SUSP: 0.5000 mL | Freq: Once | INTRAMUSCULAR | Status: DC

## 2019-10-25 MED ORDER — DIPHENHYDRAMINE HCL 25 MG PO CAPS: 25.0000 mg | ORAL_CAPSULE | Freq: Four times a day (QID) | ORAL | Status: DC | PRN

## 2019-10-25 MED ORDER — SENNOSIDES-DOCUSATE SODIUM 8.6-50 MG PO TABS
2.0000 | ORAL_TABLET | ORAL | Status: DC
  Administered 2019-10-27: 2 via ORAL
  Filled 2019-10-25 (×2): qty 2

## 2019-10-25 MED ORDER — BENZOCAINE-MENTHOL 20-0.5 % EX AERO
1.0000 "application " | INHALATION_SPRAY | CUTANEOUS | Status: DC | PRN
  Filled 2019-10-25: qty 56

## 2019-10-25 MED ORDER — SIMETHICONE 80 MG PO CHEW: 80.0000 mg | CHEWABLE_TABLET | ORAL | Status: DC | PRN

## 2019-10-25 MED ORDER — ACETAMINOPHEN 325 MG PO TABS
650.0000 mg | ORAL_TABLET | ORAL | Status: DC | PRN
  Administered 2019-10-25 – 2019-10-26 (×2): 650 mg via ORAL
  Filled 2019-10-25 (×2): qty 2

## 2019-10-25 MED ORDER — COCONUT OIL OIL: 1.0000 "application " | TOPICAL_OIL | Status: DC | PRN

## 2019-10-25 MED ORDER — ZOLPIDEM TARTRATE 5 MG PO TABS: 5.0000 mg | ORAL_TABLET | Freq: Every evening | ORAL | Status: DC | PRN

## 2019-10-25 NOTE — Discharge Summary (Addendum)
Postpartum Discharge Summary  Date of Service updated 10/27/19     Patient Name: Sarah [name] DOB: [birth year] MRN: [id number]  Date of admission: 10/24/2019 Delivering Provider: Zettie Cooley E   Date of discharge: 10/27/2019  Admitting diagnosis: Gestational hypertension [O13.9] Intrauterine pregnancy: [redacted]w[redacted]d    Secondary diagnosis:  Active Problems:   Gestational hypertension  Additional problems: none     Discharge diagnosis: Term Pregnancy Delivered, Gestational Hypertension and Preeclampsia (mild)                                                                                                Post partum procedures:none  Augmentation: Pitocin and Foley Balloon  Complications: None  Hospital course:  Induction of Labor With Vaginal Delivery   29y.o. yo G1P0 at 481w4das admitted to the hospital 10/24/2019 for induction of labor.  Indication for induction: Preeclampsia.  Patient had an uncomplicated labor course as follows: Membrane Rupture Time/Date: 9:32 PM ,10/24/2019   Intrapartum Procedures: Episiotomy: None [1]                                         Lacerations:  2nd degree [3];Perineal [11]  Patient had delivery of a Viable infant.  Information for the patient's newborn:  WoClaudina, Oliphant0[[id number]]Delivery Method: Vag-Spont    10/25/2019  Details of delivery can be found in separate delivery note.  Patient had a routine postpartum course. Patient is discharged home 10/27/19. Delivery time: 1:26 AM    Magnesium Sulfate received: No BMZ received: No Rhophylac:No MMR:No Transfusion:No  Physical exam  Vitals:   10/26/19 0630 10/26/19 1438 10/26/19 2056 10/27/19 0556  BP: 108/77 120/62 127/77 124/77  Pulse: 66 70 72 65  Resp: '18 17 16 16  '$ Temp: 97.9 F (36.6 C) 98.5 F (36.9 C) 98.2 F (36.8 C) 98 F (36.7 C)  TempSrc: Oral Oral Oral Oral  SpO2: 98% 98% 100% 98%   General: alert, cooperative and no distress Lochia: appropriate Uterine Fundus:  firm DVT Evaluation: No evidence of DVT seen on physical exam. Labs: Lab Results  Component Value Date   WBC 15.0 (H) 10/25/2019   HGB 11.4 (L) 10/25/2019   HCT 33.0 (L) 10/25/2019   MCV 86.2 10/25/2019   PLT 169 10/25/2019   CMP Latest Ref Rng & Units 10/24/2019  Glucose 70 - 99 mg/dL 73  BUN 6 - 20 mg/dL 6  Creatinine 0.44 - 1.00 mg/dL 0.74  Sodium 135 - 145 mmol/L 137  Potassium 3.5 - 5.1 mmol/L 3.8  Chloride 98 - 111 mmol/L 102  CO2 22 - 32 mmol/L 21(L)  Calcium 8.9 - 10.3 mg/dL 9.4  Total Protein 6.5 - 8.1 g/dL 6.2(L)  Total Bilirubin 0.3 - 1.2 mg/dL 1.1  Alkaline Phos 38 - 126 U/L 146(H)  AST 15 - 41 U/L 54(H)  ALT 0 - 44 U/L 49(H)    Discharge instruction: per After Visit Summary and "Baby and Me Booklet".  After visit meds:  Allergies as  of 10/27/2019      Reactions   Azithromycin    GI-- stomach cramps   Penicillins Hives      Medication List    STOP taking these medications   HYDROcodone-acetaminophen 5-325 MG tablet Commonly known as: Norco   oxyCODONE-acetaminophen 5-325 MG tablet Commonly known as: PERCOCET/ROXICET   traMADol 50 MG tablet Commonly known as: ULTRAM     TAKE these medications   acetaminophen 325 MG tablet Commonly known as: Tylenol Take 2 tablets (650 mg total) by mouth every 4 (four) hours as needed (for pain scale < 4).   Blood Pressure Kit Monitor BP readings at home regularly z34.90 large size   Blood Pressure Monitoring Kit 1 kit by Does not apply route once a week.   Comfort Fit Maternity Supp Lg Misc 1 Units by Does not apply route daily as needed.   Concept OB 130-92.4-1 MG Caps Take 1 capsule by mouth daily.   ibuprofen 600 MG tablet Commonly known as: ADVIL Take 1 tablet (600 mg total) by mouth every 6 (six) hours.   NP Thyroid 30 MG tablet Generic drug: thyroid Take 1 tablet (30 mg total) by mouth daily before breakfast.       Diet: routine diet  Activity: Advance as tolerated. Pelvic rest for 6 weeks.    Outpatient follow up:as below Follow up Appt: Future Appointments  Date Time Provider [place]  10/30/2019  8:30 AM [facility] None  11/23/2019  1:00 PM Chancy Milroy, MD [facility] None   Follow up Visit:   Please schedule this patient for Postpartum visit in: 4 weeks with the following provider: Any provider For C/S patients schedule nurse incision check in weeks 2 weeks: no High risk pregnancy complicated by: preeclampsia Delivery mode:  SVD Anticipated Birth Control:  Condoms PP Procedures needed: BP check in one week over the phone.   Schedule Integrated BH visit: no   Newborn Data: Live born female  Birth Weight:  3096 g APGAR: 103, 9  Newborn Delivery   Birth date/time: 10/25/2019 01:26:00 Delivery type: Vaginal, Spontaneous      Baby Feeding: Breast Disposition:home with mother   10/27/2019 Merilyn Baba, DO

## 2019-10-25 NOTE — Lactation Note (Signed)
This note was copied from a baby's chart. Lactation Consultation Note  Patient Name: Sarah [name] [id number] Date: 10/25/2019 Reason for consult: Initial assessment;Primapara;Term Infant is 80 hours old and has latched to both breasts.  Mom is using cross cradle and football hold.  Instructed to feed with any feeding cue and hold baby skin to skin to encourage feeding.  Questions answered.  Breastfeeding consultation services information given and reviewed.  Mom is very tired.  Instructed to call for assist prn.  Maternal Data    Feeding Feeding Type: Breast Fed  LATCH Score Latch: Grasps breast easily, tongue down, lips flanged, rhythmical sucking.  Audible Swallowing: A few with stimulation  Type of Nipple: Flat(semi flat; right more than left)  Comfort (Breast/Nipple): Soft / non-tender  Hold (Positioning): Assistance needed to correctly position infant at breast and maintain latch.  LATCH Score: 7  Interventions Interventions: Breast massage;Hand express;Skin to skin;Assisted with latch;Breast feeding basics reviewed;Support pillows;Position options;Shells  Lactation Tools Discussed/Used     Consult Status Consult Status: Follow-up Date: 10/26/19 Follow-up type: In-patient    Huston Foley 10/25/2019, 10:48 AM

## 2019-10-25 NOTE — Lactation Note (Signed)
This note was copied from a baby's chart. Lactation Consultation Note  Patient Name: Sarah [name] [id number] Date: 10/25/2019 Reason for consult: Follow-up assessment Mom called out for latch assist.  Baby is positioned in cradle hold on left breast.  Baby is licking breast and opening mouth.  Assisted with cross cradle hold.  Reviewed hand expression and drop expressed.  After a few attempts baby latched well to breast.  Observed active suck/swallows.  When baby came off assisted with football hold on the right.  Baby latched easily.  Breast shells given with instructions.  Encouraged to call for assist prn.  Maternal Data    Feeding Feeding Type: Breast Fed  LATCH Score Latch: Grasps breast easily, tongue down, lips flanged, rhythmical sucking.  Audible Swallowing: Spontaneous and intermittent  Type of Nipple: Flat  Comfort (Breast/Nipple): Soft / non-tender  Hold (Positioning): Assistance needed to correctly position infant at breast and maintain latch.  LATCH Score: 8  Interventions Interventions: Adjust position;Assisted with latch;Support pillows;Skin to skin;Position options;Breast massage;Hand express;Shells  Lactation Tools Discussed/Used     Consult Status Consult Status: Follow-up Date: 10/26/19 Follow-up type: In-patient    Huston Foley 10/25/2019, 11:33 AM

## 2019-10-26 ENCOUNTER — Other Ambulatory Visit (HOSPITAL_COMMUNITY): Payer: 59

## 2019-10-26 ENCOUNTER — Other Ambulatory Visit: Payer: Self-pay | Admitting: Family Medicine

## 2019-10-26 MED ORDER — CALAMINE EX LOTN
TOPICAL_LOTION | Freq: Three times a day (TID) | CUTANEOUS | Status: DC
  Administered 2019-10-26: 1 via TOPICAL
  Filled 2019-10-26: qty 177

## 2019-10-26 NOTE — Progress Notes (Addendum)
POSTPARTUM PROGRESS NOTE  Post Partum Day 1  Subjective:  Sarah [name] is a 29 y.o. G1P1001 s/p SVD at [redacted]w[redacted]d.  No acute events overnight.  Pt denies problems with ambulating, voiding or po intake.  She denies nausea or vomiting.  Pain is well controlled.  She has had flatus. She has not had bowel movement.  Lochia Moderate.   She reports a rash near where blood was drawn - reports skin being irritated, peeling and burning. Believes it is from where the tape was put on and off 3 different times.   Objective: Blood pressure 108/77, pulse 66, temperature 97.9 F (36.6 C), temperature source Oral, resp. rate 18, last menstrual period 01/14/2019, SpO2 98 %, unknown if currently breastfeeding.  Physical Exam:  General: alert, cooperative and no distress Abdomen: soft, nontender,  Uterine Fundus: firm, appropriately tender DVT Evaluation: No calf swelling or tenderness Extremities: no edema Skin: 4.5c antecubital rash on left arm - dry and peeling   Recent Labs    10/24/19 1542 10/25/19 0420  HGB 12.9 11.4*  HCT 37.7 33.0*    Assessment/Plan: Sarah [name] is a 29 y.o. G1P1001 s/p SVD at [redacted]w[redacted]d   PPD#1 - Doing well Contraception: Condoms  Feeding: Breast  Dispo: Plan for discharge tomorrow. Rash: calamine lotion ordered for rash, reassess in morning prior to discharge    LOS: 2 days   Sharyon Cable, CNM 10/26/2019, 2:09 PM

## 2019-10-26 NOTE — Progress Notes (Signed)
Pt c/o irritated area on left antecubital. Pt reports had 3 lab sticks on that area. Site pink and appears to be irritation from tape. Pt reports it has gotten worse and burns and would like something to put on it. Pt denies having any previous issues with adhesive or tape. Dr. Selena Batten notified and to see pt.

## 2019-10-26 NOTE — Anesthesia Postprocedure Evaluation (Signed)
Anesthesia Post Note  Patient: Sarah [name]  Procedure(s) Performed: AN AD HOC LABOR EPIDURAL     Patient location during evaluation: Mother Baby Anesthesia Type: Epidural Level of consciousness: awake and alert Pain management: pain level controlled Vital Signs Assessment: post-procedure vital signs reviewed and stable Respiratory status: spontaneous breathing, nonlabored ventilation and respiratory function stable Cardiovascular status: stable Postop Assessment: no headache, no backache and epidural receding Anesthetic complications: no    Last Vitals:  Vitals:   10/25/19 2310 10/26/19 0630  BP: 117/79 108/77  Pulse: 84 66  Resp: 16 18  Temp: 36.6 C 36.6 C  SpO2: 97% 98%    Last Pain:  Vitals:   10/26/19 0630  TempSrc: Oral  PainSc: 3    Pain Goal: Patients Stated Pain Goal: 2 (10/26/19 0630)                 Marrion Coy

## 2019-10-26 NOTE — Lactation Note (Signed)
This note was copied from a baby's chart. Lactation Consultation Note  Patient Name: Sarah [name] [id number] Date: 10/26/2019 Reason for consult: Follow-up assessment;Primapara;1st time breastfeeding;Term  P1 mother whose infant is now 44 hours old.  Baby was asleep in the bassinet when I arrived.  Mother had some basic breast feeding questions which I answered to her satisfaction.  She was also concerned and thought her nipples may be cracking.  Asked for permission to assess and mother agreeable.  Mother's breasts are soft and non tender and nipples are flat and slightly irritated.  No cracking or breakdown noted.  Mother had breast shells at the bedside but stated she really was not sure how to use them.  Reviewed shell usage.  Provided a manual pump with instructions for use to help evert nipples.  Demonstrated the pump and the #24 flange size is appropriate at this time.  Suggested mother use her own EBM to rub into nipples/areolas after feeding.  Also obtained coconut oil and instructed on the use of this product including lubricating the pump flanges.  Mother appreciative.  Encouraged to feed 8-12 times/24 hours or sooner if baby shows feeding cues.  Suggested hand expression before/after feeding to help increase milk supply.  Colostrum container provided and milk storage times reviewed.  Mother has been able to obtain a few colostrum drops.  Finger feeding demonstrated.  Mother stated she has been waking baby between 3-4 hours to attempt latching.  She stated that "sometimes" it hurts.  Discussed how to obtain and maintain a good deep latch.  Suggested she call her RN/LC as needed for latch assistance.  Mother has a DEBP for home use and will return to work in 12 weeks.  Milk storage guidelines discussed from Mother/Baby booklet.  Father present.   Maternal Data Formula Feeding for Exclusion: No Has patient been taught Hand Expression?: Yes Does the patient have breastfeeding  experience prior to this delivery?: No  Feeding Feeding Type: Breast Fed  LATCH Score Latch: Grasps breast easily, tongue down, lips flanged, rhythmical sucking.  Audible Swallowing: A few with stimulation  Type of Nipple: Everted at rest and after stimulation  Comfort (Breast/Nipple): Soft / non-tender  Hold (Positioning): Assistance needed to correctly position infant at breast and maintain latch.  LATCH Score: 8  Interventions    Lactation Tools Discussed/Used WIC Program: No Pump Review: Setup, frequency, and cleaning;Milk Storage Initiated by:: Sarah [name] Date initiated:: 10/26/19   Consult Status Consult Status: Follow-up Date: 10/27/19 Follow-up type: In-patient    Dora Sims 10/26/2019, 9:33 AM

## 2019-10-26 NOTE — Lactation Note (Signed)
This note was copied from a baby's chart. Lactation Consultation Note  Patient Name: Girl Maryanna Stuber [id number] Date: 10/26/2019 Reason for consult: Follow-up assessment  P1 mother whose infant is now 51 hours old.  Mother requested latch assistance.  Mother had been attempting to latch baby to the breast approximately 10 minutes prior to my arrival.  She had her positioned appropriately in the football hold on the right breast.  Assisted baby to latch, however, she became very frustrated and pulled off.  Burped her firmly and she had multiple burps.  Parents also informed me that she has been gassy.  Assisted to latch a second time and this time baby was much more content.  Demonstrated breast compressions and long jaw excursions with intermittent swallows noted.  Baby content at the breast. Showed mother techniques to keep her actively sucking at the breast.  Suggested mother continue to burp her frequently, especially if she becomes agitated while feeding.  Mother appreciative of help.  RN updated.     Maternal Data Formula Feeding for Exclusion: No Has patient been taught Hand Expression?: Yes Does the patient have breastfeeding experience prior to this delivery?: No  Feeding    LATCH Score                   Interventions    Lactation Tools Discussed/Used WIC Program: No Pump Review: Setup, frequency, and cleaning;Milk Storage Initiated by:: Maahir Horst Date initiated:: 10/26/19   Consult Status Consult Status: Follow-up Date: 10/27/19 Follow-up type: In-patient    Dora Sims 10/26/2019, 12:52 PM

## 2019-10-27 ENCOUNTER — Encounter: Payer: 59 | Admitting: Obstetrics & Gynecology

## 2019-10-27 MED ORDER — ACETAMINOPHEN 325 MG PO TABS: 650.0000 mg | ORAL_TABLET | ORAL | 0 refills | Status: DC | PRN

## 2019-10-27 MED ORDER — IBUPROFEN 600 MG PO TABS: 600.0000 mg | ORAL_TABLET | Freq: Four times a day (QID) | ORAL | 0 refills | Status: DC

## 2019-10-27 NOTE — Discharge Instructions (Signed)

## 2019-10-27 NOTE — Lactation Note (Signed)
This note was copied from a baby's chart. Lactation Consultation Note  Patient Name: Sarah [name] [id number] Date: 10/27/2019 Reason for consult: Follow-up assessment;Infant weight loss   Baby 56 hours old.  Reviewed hand expression with numerous drops.  Mother has flat tender nipples.  Weight loss 8.9% but baby only lost 2.3% last night. Observed latch with frequent swallows. Encouraged mother to compress breast to keep baby active and support breast.  Discussed post pumping and giving volume back to baby to help stabilize weight loss. Mother has DEBP at home. Feed on demand with cues.  Goal 8-12+ times per day after first 24 hrs.  Place baby STS if not cueing.  Reviewed engorgement care and monitoring voids/stools.    Maternal Data Has patient been taught Hand Expression?: Yes  Feeding Feeding Type: Breast Fed  LATCH Score Latch: Grasps breast easily, tongue down, lips flanged, rhythmical sucking.(latched upon entering)  Audible Swallowing: Spontaneous and intermittent  Type of Nipple: Everted at rest and after stimulation  Comfort (Breast/Nipple): Filling, red/small blisters or bruises, mild/mod discomfort  Hold (Positioning): Assistance needed to correctly position infant at breast and maintain latch.  LATCH Score: 8  Interventions Interventions: Breast feeding basics reviewed;Skin to skin;Breast compression;Comfort gels  Lactation Tools Discussed/Used     Consult Status Consult Status: Complete Date: 10/27/19    Dahlia Byes [facility] 10/27/2019, 10:17 AM

## 2019-10-27 NOTE — Progress Notes (Signed)
CSW received consult for history of anxiety.  CSW met with MOB to offer support and complete assessment.    MOB sitting up in bed with FOB present at bedside and holding infant, when CSW entered the room. CSW introduced self and received verbal permission from MOB to complete assessment with FOB present. MOB and FOB both very pleasant and engaged throughout assessment. CSW inquired about MOB's mental health history and MOB acknowledged a history of anxiety beginning in high school. MOB denied any recent mental health symptoms but did note some mild anxiety around gallbladder issues she has been having. MOB shared a history of being on medications a long time ago but reported they weren't very effective. FOB and MOB shared they prefer taking a more holistic approach and talking about their feelings. CSW provided education regarding the baby blues period vs. perinatal mood disorders. CSW recommended self-evaluation during the postpartum time period using the New Mom Checklist from Postpartum Progress and encouraged MOB to contact a medical professional if symptoms are noted at any time. MOB did not appear to be displaying any acute mental health symptoms and denied any current SI or HI. MOB reported having a good support system consisting of both sides of their families.  MOB confirmed having all essential items for infant once discharged and stated infant would be sleeping in a bassinet once home. CSW provided review of Sudden Infant Death Syndrome (SIDS) precautions and safe sleeping habits.    CSW identifies no further need for intervention and no barriers to discharge at this time.  Elijio Miles, LCSW [facility] [employer] [facility] [phone]

## 2019-10-28 ENCOUNTER — Inpatient Hospital Stay (HOSPITAL_COMMUNITY): Admission: AD | Admit: 2019-10-28 | Payer: 59 | Source: Home / Self Care | Admitting: Family Medicine

## 2019-10-28 ENCOUNTER — Inpatient Hospital Stay (HOSPITAL_COMMUNITY): Payer: 59

## 2019-10-30 ENCOUNTER — Telehealth (INDEPENDENT_AMBULATORY_CARE_PROVIDER_SITE_OTHER): Payer: 59

## 2019-10-30 VITALS — BP 131/83

## 2019-10-30 DIAGNOSIS — O1404 Mild to moderate pre-eclampsia, complicating childbirth: Secondary | ICD-10-CM

## 2019-10-30 NOTE — Progress Notes (Addendum)
Virtual Visit via Telephone Note  I connected with Sarah [name] on 10/30/19 at  8:30 AM EST by telephone and verified that I am speaking with the correct person using two identifiers.   Subjective:  Sarah [name] is a 29 y.o. female virtual BP check.  S/p SVD 10/25/2019 GHTN & mild preeclampsia.   Hypertension ROS: no TIA's, no chest pain on exertion, no dyspnea on exertion and no swelling of ankles.    Objective:  BP 131/83   BP stable  Assessment:   Blood Pressure well controlled.   Plan:  Keep upcoming routine postpartum visit   Patient seen and assessed by nursing staff during this encounter. I have reviewed the chart and agree with the documentation and plan.  Coral Ceo, MD 10/30/2019 9:35 AM

## 2019-11-10 ENCOUNTER — Ambulatory Visit: Payer: Self-pay | Admitting: General Surgery

## 2019-11-23 ENCOUNTER — Ambulatory Visit (INDEPENDENT_AMBULATORY_CARE_PROVIDER_SITE_OTHER): Payer: 59 | Admitting: Obstetrics and Gynecology

## 2019-11-23 ENCOUNTER — Encounter: Payer: Self-pay | Admitting: Obstetrics and Gynecology

## 2019-11-23 ENCOUNTER — Other Ambulatory Visit: Payer: Self-pay

## 2019-11-23 VITALS — BP 116/74 | HR 64 | Wt 162.0 lb

## 2019-11-23 DIAGNOSIS — Z1332 Encounter for screening for maternal depression: Secondary | ICD-10-CM

## 2019-11-23 DIAGNOSIS — E039 Hypothyroidism, unspecified: Secondary | ICD-10-CM

## 2019-11-23 DIAGNOSIS — K8020 Calculus of gallbladder without cholecystitis without obstruction: Secondary | ICD-10-CM

## 2019-11-23 DIAGNOSIS — Z113 Encounter for screening for infections with a predominantly sexual mode of transmission: Secondary | ICD-10-CM | POA: Diagnosis not present

## 2019-11-23 DIAGNOSIS — N898 Other specified noninflammatory disorders of vagina: Secondary | ICD-10-CM

## 2019-11-23 DIAGNOSIS — Z3492 Encounter for supervision of normal pregnancy, unspecified, second trimester: Secondary | ICD-10-CM

## 2019-11-23 NOTE — Patient Instructions (Signed)
Health Maintenance, Female Adopting a healthy lifestyle and getting preventive care are important in promoting health and wellness. Ask your health care provider about:  The right schedule for you to have regular tests and exams.  Things you can do on your own to prevent diseases and keep yourself healthy. What should I know about diet, weight, and exercise? Eat a healthy diet   Eat a diet that includes plenty of vegetables, fruits, low-fat dairy products, and lean protein.  Do not eat a lot of foods that are high in solid fats, added sugars, or sodium. Maintain a healthy weight Body mass index (BMI) is used to identify weight problems. It estimates body fat based on height and weight. Your health care provider can help determine your BMI and help you achieve or maintain a healthy weight. Get regular exercise Get regular exercise. This is one of the most important things you can do for your health. Most adults should:  Exercise for at least 150 minutes each week. The exercise should increase your heart rate and make you sweat (moderate-intensity exercise).  Do strengthening exercises at least twice a week. This is in addition to the moderate-intensity exercise.  Spend less time sitting. Even light physical activity can be beneficial. Watch cholesterol and blood lipids Have your blood tested for lipids and cholesterol at 30 years of age, then have this test every 5 years. Have your cholesterol levels checked more often if:  Your lipid or cholesterol levels are high.  You are older than 30 years of age.  You are at high risk for heart disease. What should I know about cancer screening? Depending on your health history and family history, you may need to have cancer screening at various ages. This may include screening for:  Breast cancer.  Cervical cancer.  Colorectal cancer.  Skin cancer.  Lung cancer. What should I know about heart disease, diabetes, and high blood  pressure? Blood pressure and heart disease  High blood pressure causes heart disease and increases the risk of stroke. This is more likely to develop in people who have high blood pressure readings, are of African descent, or are overweight.  Have your blood pressure checked: ? Every 3-5 years if you are 18-39 years of age. ? Every year if you are 40 years old or older. Diabetes Have regular diabetes screenings. This checks your fasting blood sugar level. Have the screening done:  Once every three years after age 40 if you are at a normal weight and have a low risk for diabetes.  More often and at a younger age if you are overweight or have a high risk for diabetes. What should I know about preventing infection? Hepatitis B If you have a higher risk for hepatitis B, you should be screened for this virus. Talk with your health care provider to find out if you are at risk for hepatitis B infection. Hepatitis C Testing is recommended for:  Everyone born from 1945 through 1965.  Anyone with known risk factors for hepatitis C. Sexually transmitted infections (STIs)  Get screened for STIs, including gonorrhea and chlamydia, if: ? You are sexually active and are younger than 30 years of age. ? You are older than 30 years of age and your health care provider tells you that you are at risk for this type of infection. ? Your sexual activity has changed since you were last screened, and you are at increased risk for chlamydia or gonorrhea. Ask your health care provider if   you are at risk.  Ask your health care provider about whether you are at high risk for HIV. Your health care provider may recommend a prescription medicine to help prevent HIV infection. If you choose to take medicine to prevent HIV, you should first get tested for HIV. You should then be tested every 3 months for as long as you are taking the medicine. Pregnancy  If you are about to stop having your period (premenopausal) and  you may become pregnant, seek counseling before you get pregnant.  Take 400 to 800 micrograms (mcg) of folic acid every day if you become pregnant.  Ask for birth control (contraception) if you want to prevent pregnancy. Osteoporosis and menopause Osteoporosis is a disease in which the bones lose minerals and strength with aging. This can result in bone fractures. If you are 65 years old or older, or if you are at risk for osteoporosis and fractures, ask your health care provider if you should:  Be screened for bone loss.  Take a calcium or vitamin D supplement to lower your risk of fractures.  Be given hormone replacement therapy (HRT) to treat symptoms of menopause. Follow these instructions at home: Lifestyle  Do not use any products that contain nicotine or tobacco, such as cigarettes, e-cigarettes, and chewing tobacco. If you need help quitting, ask your health care provider.  Do not use street drugs.  Do not share needles.  Ask your health care provider for help if you need support or information about quitting drugs. Alcohol use  Do not drink alcohol if: ? Your health care provider tells you not to drink. ? You are pregnant, may be pregnant, or are planning to become pregnant.  If you drink alcohol: ? Limit how much you use to 0-1 drink a day. ? Limit intake if you are breastfeeding.  Be aware of how much alcohol is in your drink. In the [place] one drink equals one 12 oz bottle of beer (355 mL), one 5 oz glass of wine (148 mL), or one 1 oz glass of hard liquor (44 mL). General instructions  Schedule regular health, dental, and eye exams.  Stay current with your vaccines.  Tell your health care provider if: ? You often feel depressed. ? You have ever been abused or do not feel safe at home. Summary  Adopting a healthy lifestyle and getting preventive care are important in promoting health and wellness.  Follow your health care provider's instructions about healthy  diet, exercising, and getting tested or screened for diseases.  Follow your health care provider's instructions on monitoring your cholesterol and blood pressure. This information is not intended to replace advice given to you by your health care provider. Make sure you discuss any questions you have with your health care provider. Document Revised: 10/01/2018 Document Reviewed: 10/01/2018 Elsevier Patient Education  2020 Elsevier Inc.  

## 2019-11-23 NOTE — Progress Notes (Signed)
Post Partum Exam  Sarah [name] is a 29 y.o. G67P1001 female who presents for a postpartum visit. She is 4 week postpartum following a spontaneous vaginal delivery. IOL d/t PEC without severe features, I have fully reviewed the prenatal and intrapartum course. The delivery was at 40w 4d gestational weeks.  Anesthesia: none. Postpartum course has been unremarkable. Baby's course has been unremakable. Baby is feeding by bottle - Enfamil with Iron. Bleeding no bleeding. Bowel function is normal. Bladder function is normal. Patient is not sexually active. Contraception method is none. Postpartum depression screening:neg  She reports some vaginal irration  The following portions of the patient's history were reviewed and updated as appropriate: allergies, current medications, past family history, past medical history, past social history, past surgical history and problem list.  Last pap smear done 03/2019 and was Normal  Review of Systems Pertinent items are noted in HPI.    Objective:  Blood pressure 116/74, pulse 64, weight 162 lb (73.5 kg), unknown if currently breastfeeding.  General:  alert   Breasts:  deferred  Lungs: clear to auscultation bilaterally  Heart:  regular rate and rhythm, S1, S2 normal, no murmur, click, rub or gallop  Abdomen: soft, non-tender; bowel sounds normal; no masses,  no organomegaly   Vulva:  not evaluated  Vagina: not evaluated  Cervix:  not evlauated  Corpus: not examined  Adnexa:  not evaluated  Rectal Exam: Not performed.        Assessment:    Nl postpartum exam. Pap smear not done at today's visit.   Plan:   1. Contraception: condoms 2. Return to nl ADL's as tolerates Self swab for vaginal irration 3. Follow up in: 1 yr or as needed.

## 2019-11-24 LAB — CERVICOVAGINAL ANCILLARY ONLY
Bacterial Vaginitis (gardnerella): NEGATIVE
Candida Glabrata: NEGATIVE
Candida Vaginitis: NEGATIVE
Chlamydia: NEGATIVE
Comment: NEGATIVE
Comment: NEGATIVE
Comment: NEGATIVE
Comment: NEGATIVE
Comment: NEGATIVE
Comment: NORMAL
Neisseria Gonorrhea: NEGATIVE
Trichomonas: NEGATIVE

## 2019-12-26 ENCOUNTER — Other Ambulatory Visit (HOSPITAL_COMMUNITY): Payer: 59

## 2020-01-19 NOTE — Pre-Procedure Instructions (Signed)
[facility] [place], [place] AT [facility] OF [address] [place] [address] Phone: [phone] Fax: [phone]      Your procedure is scheduled on Monday, April 5th, from 08:30 AM to 10:00 AM.  Report to [facility] Main Entrance "A" at 06:30 A.M., and check in at the Admitting office.  Call this number if you have problems the morning of surgery:  [phone]  Call [phone] if you have any questions prior to your surgery date Monday-Friday 8am-4pm.    Remember:  Do not eat after midnight the night before your surgery.  You may drink clear liquids until 05:30 AM the morning of your surgery.    Clear liquids allowed are: Water, Non-Citrus Juices (without pulp), Carbonated Beverages, Clear Tea, Black Coffee Only, and Gatorade.    Take these medicines the morning of surgery with A SIP OF WATER : fexofenadine (ALLEGRA)  NP THYROID  IF NEEDED:  traMADol (ULTRAM) fluticasone (FLONASE) nasal spray  As of today, STOP taking any Aspirin (unless otherwise instructed by your surgeon) and Aspirin containing products, Aleve, Naproxen, Ibuprofen, Motrin, Advil, Goody's, BC's, all herbal medications, fish oil, and all vitamins.                      Do not wear jewelry, make up, or nail polish.            Do not wear lotions, powders, perfumes, or deodorant.            Do not shave 48 hours prior to surgery.              Do not bring valuables to the hospital.            [facility] is not responsible for any belongings or valuables.  Do NOT Smoke (Tobacco/Vapping) or drink Alcohol 24 hours prior to your procedure If you use a CPAP at night, you may bring all equipment for your overnight stay.   Contacts, glasses, dentures or bridgework may not be worn into surgery.      For patients admitted to the hospital, discharge time will be determined by your treatment team.   Patients discharged the day of surgery will not be  allowed to drive home, and someone needs to stay with them for 24 hours.    Special instructions:   [place]- Preparing For Surgery  Before surgery, you can play an important role. Because skin is not sterile, your skin needs to be as free of germs as possible. You can reduce the number of germs on your skin by washing with CHG (chlorahexidine gluconate) Soap before surgery.  CHG is an antiseptic cleaner which kills germs and bonds with the skin to continue killing germs even after washing.    Oral Hygiene is also important to reduce your risk of infection.  Remember - BRUSH YOUR TEETH THE MORNING OF SURGERY WITH YOUR REGULAR TOOTHPASTE  Please do not use if you have an allergy to CHG or antibacterial soaps. If your skin becomes reddened/irritated stop using the CHG.  Do not shave (including legs and underarms) for at least 48 hours prior to first CHG shower. It is OK to shave your face.  Please follow these instructions carefully.   1. Shower the NIGHT BEFORE SURGERY and the MORNING OF SURGERY with CHG Soap.   2. If you chose to wash your hair, wash your hair first as  usual with your normal shampoo.  3. After you shampoo, rinse your hair and body thoroughly to remove the shampoo.  4. Use CHG as you would any other liquid soap. You can apply CHG directly to the skin and wash gently with a scrungie or a clean washcloth.   5. Apply the CHG Soap to your body ONLY FROM THE NECK DOWN.  Do not use on open wounds or open sores. Avoid contact with your eyes, ears, mouth and genitals (private parts). Wash Face and genitals (private parts)  with your normal soap.   6. Wash thoroughly, paying special attention to the area where your surgery will be performed.  7. Thoroughly rinse your body with warm water from the neck down.  8. DO NOT shower/wash with your normal soap after using and rinsing off the CHG Soap.  9. Pat yourself dry with a CLEAN TOWEL.  10. Wear CLEAN PAJAMAS to bed the night  before surgery, wear comfortable clothes the morning of surgery  11. Place CLEAN SHEETS on your bed the night of your first shower and DO NOT SLEEP WITH PETS.   Day of Surgery:   Do not apply any deodorants/lotions.  Please wear clean clothes to the hospital/surgery center.   Remember to brush your teeth WITH YOUR REGULAR TOOTHPASTE.   Please read over the following fact sheets that you were given.

## 2020-01-20 ENCOUNTER — Other Ambulatory Visit: Payer: Self-pay

## 2020-01-20 ENCOUNTER — Encounter (HOSPITAL_COMMUNITY)
Admission: RE | Admit: 2020-01-20 | Discharge: 2020-01-20 | Disposition: A | Discharge status: Home / Self Care | Payer: 59 | Source: Ambulatory Visit | Attending: General Surgery | Admitting: General Surgery

## 2020-01-20 ENCOUNTER — Encounter (HOSPITAL_COMMUNITY): Payer: Self-pay

## 2020-01-20 DIAGNOSIS — Z01812 Encounter for preprocedural laboratory examination: Secondary | ICD-10-CM | POA: Insufficient documentation

## 2020-01-20 HISTORY — DX: Headache, unspecified: R51.9

## 2020-01-20 LAB — CBC
HCT: 42 % (ref 36.0–46.0)
Hemoglobin: 13.8 g/dL (ref 12.0–15.0)
MCH: 29.4 pg (ref 26.0–34.0)
MCHC: 32.9 g/dL (ref 30.0–36.0)
MCV: 89.6 fL (ref 80.0–100.0)
Platelets: 260 10*3/uL (ref 150–400)
RBC: 4.69 MIL/uL (ref 3.87–5.11)
RDW: 12.2 % (ref 11.5–15.5)
WBC: 6.1 10*3/uL (ref 4.0–10.5)
nRBC: 0 % (ref 0.0–0.2)

## 2020-01-20 LAB — BASIC METABOLIC PANEL
Anion gap: 8 (ref 5–15)
BUN: 7 mg/dL (ref 6–20)
CO2: 26 mmol/L (ref 22–32)
Calcium: 9.5 mg/dL (ref 8.9–10.3)
Chloride: 106 mmol/L (ref 98–111)
Creatinine, Ser: 0.63 mg/dL (ref 0.44–1.00)
GFR calc Af Amer: >60 mL/min (ref >60)
GFR calc non Af Amer: >60 mL/min (ref >60)
Glucose, Bld: 88 mg/dL (ref 70–99)
Potassium: 3.9 mmol/L (ref 3.5–5.1)
Sodium: 140 mmol/L (ref 135–145)

## 2020-01-20 NOTE — Progress Notes (Signed)
PCP - Luana Shu, DO Cardiologist - Denies  PPM/ICD - Denies  Chest x-ray - N/A EKG - 05/07/19 Stress Test - Denies ECHO - Denies Cardiac Cath - Denies  Sleep Study - Denies CPAP - Denies  Patient denies being a diabetic.  Blood Thinner Instructions: N/A Aspirin Instructions: N/A  ERAS Protcol - Yes PRE-SURGERY Ensure or G2- N/A  COVID TEST- 01/21/20   Anesthesia review: No  Patient denies shortness of breath, fever, cough and chest pain at PAT appointment   All instructions explained to the patient, with a verbal understanding of the material. Patient agrees to go over the instructions while at home for a better understanding. Patient also instructed to self quarantine after being tested for COVID-19. The opportunity to ask questions was provided.

## 2020-01-21 ENCOUNTER — Other Ambulatory Visit (HOSPITAL_COMMUNITY)
Admission: RE | Admit: 2020-01-21 | Discharge: 2020-01-21 | Disposition: A | Discharge status: Home / Self Care | Payer: 59 | Source: Ambulatory Visit | Attending: General Surgery | Admitting: General Surgery

## 2020-01-21 DIAGNOSIS — Z01812 Encounter for preprocedural laboratory examination: Secondary | ICD-10-CM | POA: Diagnosis present

## 2020-01-21 DIAGNOSIS — Z20822 Contact with and (suspected) exposure to covid-19: Secondary | ICD-10-CM | POA: Insufficient documentation

## 2020-01-21 LAB — SARS CORONAVIRUS 2 (TAT 6-24 HRS): SARS Coronavirus 2: NEGATIVE

## 2020-01-24 NOTE — Anesthesia Preprocedure Evaluation (Signed)
Anesthesia Evaluation  Patient identified by MRN, date of birth, ID band Patient awake    Reviewed: Allergy & Precautions, NPO status , Patient's Chart, lab work & pertinent test results  Airway Mallampati: II  TM Distance: >3 FB Neck ROM: Full    Dental  (+) Teeth Intact, Dental Advisory Given   Pulmonary neg pulmonary ROS,    Pulmonary exam normal breath sounds clear to auscultation       Cardiovascular negative cardio ROS Normal cardiovascular exam Rhythm:Regular Rate:Normal     Neuro/Psych  Headaches, PSYCHIATRIC DISORDERS Anxiety    GI/Hepatic negative GI ROS, Gall stones    Endo/Other  Hypothyroidism Obesity   Renal/GU negative Renal ROS  negative genitourinary   Musculoskeletal negative musculoskeletal ROS (+)   Abdominal   Peds  Hematology negative hematology ROS (+)   Anesthesia Other Findings   Reproductive/Obstetrics negative OB ROS Recent delivery: 12/2019                             Anesthesia Physical Anesthesia Plan  ASA: II  Anesthesia Plan: General   Post-op Pain Management:    Induction: Intravenous  PONV Risk Score and Plan: 4 or greater and Ondansetron, Dexamethasone, Midazolam, Scopolamine patch - Pre-op and Treatment may vary due to age or medical condition  Airway Management Planned: Oral ETT  Additional Equipment: None  Intra-op Plan:   Post-operative Plan: Extubation in OR  Informed Consent: I have reviewed the patients History and Physical, chart, labs and discussed the procedure including the risks, benefits and alternatives for the proposed anesthesia with the patient or authorized representative who has indicated his/her understanding and acceptance.     Dental advisory given  Plan Discussed with: CRNA  Anesthesia Plan Comments:         Anesthesia Quick Evaluation

## 2020-01-25 ENCOUNTER — Ambulatory Visit (HOSPITAL_COMMUNITY): Payer: 59

## 2020-01-25 ENCOUNTER — Other Ambulatory Visit: Payer: Self-pay

## 2020-01-25 ENCOUNTER — Ambulatory Visit (HOSPITAL_COMMUNITY)
Admission: RE | Admit: 2020-01-25 | Discharge: 2020-01-25 | Disposition: A | Discharge status: Home / Self Care | Payer: 59 | Attending: General Surgery | Admitting: General Surgery

## 2020-01-25 ENCOUNTER — Ambulatory Visit (HOSPITAL_COMMUNITY): Payer: 59 | Admitting: Anesthesiology

## 2020-01-25 ENCOUNTER — Encounter (HOSPITAL_COMMUNITY): Payer: Self-pay | Admitting: General Surgery

## 2020-01-25 ENCOUNTER — Encounter (HOSPITAL_COMMUNITY)
Admission: RE | Disposition: A | Discharge status: Home / Self Care | Payer: Self-pay | Source: Home / Self Care | Attending: General Surgery

## 2020-01-25 DIAGNOSIS — Z419 Encounter for procedure for purposes other than remedying health state, unspecified: Secondary | ICD-10-CM

## 2020-01-25 DIAGNOSIS — K8010 Calculus of gallbladder with chronic cholecystitis without obstruction: Secondary | ICD-10-CM | POA: Diagnosis not present

## 2020-01-25 DIAGNOSIS — O9963 Diseases of the digestive system complicating the puerperium: Secondary | ICD-10-CM | POA: Insufficient documentation

## 2020-01-25 DIAGNOSIS — Z881 Allergy status to other antibiotic agents status: Secondary | ICD-10-CM | POA: Insufficient documentation

## 2020-01-25 DIAGNOSIS — Z880 Allergy status to penicillin: Secondary | ICD-10-CM | POA: Insufficient documentation

## 2020-01-25 HISTORY — PX: CHOLECYSTECTOMY: SHX55

## 2020-01-25 LAB — POCT PREGNANCY, URINE: Preg Test, Ur: NEGATIVE

## 2020-01-25 SURGERY — LAPAROSCOPIC CHOLECYSTECTOMY WITH INTRAOPERATIVE CHOLANGIOGRAM
Anesthesia: General

## 2020-01-25 MED ORDER — DEXAMETHASONE SODIUM PHOSPHATE 10 MG/ML IJ SOLN
INTRAMUSCULAR | Status: DC | PRN
  Administered 2020-01-25: 10 mg via INTRAVENOUS

## 2020-01-25 MED ORDER — LACTATED RINGERS IV SOLN: INTRAVENOUS | Status: DC

## 2020-01-25 MED ORDER — PROMETHAZINE HCL 25 MG/ML IJ SOLN: 6.2500 mg | INTRAMUSCULAR | Status: DC | PRN

## 2020-01-25 MED ORDER — ONDANSETRON HCL 4 MG/2ML IJ SOLN
INTRAMUSCULAR | Status: AC
  Filled 2020-01-25: qty 2

## 2020-01-25 MED ORDER — SUGAMMADEX SODIUM 200 MG/2ML IV SOLN
INTRAVENOUS | Status: DC | PRN
  Administered 2020-01-25: 200 mg via INTRAVENOUS

## 2020-01-25 MED ORDER — CELECOXIB 200 MG PO CAPS
200.0000 mg | ORAL_CAPSULE | ORAL | Status: AC
  Administered 2020-01-25: 200 mg via ORAL
  Filled 2020-01-25: qty 1

## 2020-01-25 MED ORDER — ROCURONIUM BROMIDE 10 MG/ML (PF) SYRINGE
PREFILLED_SYRINGE | INTRAVENOUS | Status: DC | PRN
  Administered 2020-01-25: 50 mg via INTRAVENOUS

## 2020-01-25 MED ORDER — KETOROLAC TROMETHAMINE 30 MG/ML IJ SOLN
INTRAMUSCULAR | Status: DC | PRN
  Administered 2020-01-25: 30 mg via INTRAVENOUS

## 2020-01-25 MED ORDER — HYDROMORPHONE HCL 1 MG/ML IJ SOLN: 0.2500 mg | INTRAMUSCULAR | Status: DC | PRN

## 2020-01-25 MED ORDER — SCOPOLAMINE 1 MG/3DAYS TD PT72
1.0000 | MEDICATED_PATCH | TRANSDERMAL | Status: DC
  Administered 2020-01-25: 1.5 mg via TRANSDERMAL
  Filled 2020-01-25: qty 1

## 2020-01-25 MED ORDER — BUPIVACAINE HCL (PF) 0.25 % IJ SOLN
INTRAMUSCULAR | Status: AC
  Filled 2020-01-25: qty 30

## 2020-01-25 MED ORDER — KETOROLAC TROMETHAMINE 30 MG/ML IJ SOLN
INTRAMUSCULAR | Status: AC
  Filled 2020-01-25: qty 1

## 2020-01-25 MED ORDER — LIDOCAINE 2% (20 MG/ML) 5 ML SYRINGE
INTRAMUSCULAR | Status: AC
  Filled 2020-01-25: qty 5

## 2020-01-25 MED ORDER — DEXAMETHASONE SODIUM PHOSPHATE 10 MG/ML IJ SOLN
INTRAMUSCULAR | Status: AC
  Filled 2020-01-25: qty 1

## 2020-01-25 MED ORDER — HYDROMORPHONE HCL 1 MG/ML IJ SOLN
INTRAMUSCULAR | Status: AC
  Filled 2020-01-25: qty 0.5

## 2020-01-25 MED ORDER — HYDROCODONE-ACETAMINOPHEN 5-325 MG PO TABS: 1.0000 | ORAL_TABLET | Freq: Four times a day (QID) | ORAL | 0 refills | Status: DC | PRN

## 2020-01-25 MED ORDER — OXYCODONE HCL 5 MG/5ML PO SOLN: 5.0000 mg | Freq: Once | ORAL | Status: AC | PRN

## 2020-01-25 MED ORDER — SODIUM CHLORIDE 0.9 % IR SOLN
Status: DC | PRN
  Administered 2020-01-25: 1000 mL

## 2020-01-25 MED ORDER — FENTANYL CITRATE (PF) 250 MCG/5ML IJ SOLN
INTRAMUSCULAR | Status: AC
  Filled 2020-01-25: qty 5

## 2020-01-25 MED ORDER — CHLORHEXIDINE GLUCONATE CLOTH 2 % EX PADS: 6.0000 | MEDICATED_PAD | Freq: Once | CUTANEOUS | Status: DC

## 2020-01-25 MED ORDER — ROCURONIUM BROMIDE 10 MG/ML (PF) SYRINGE
PREFILLED_SYRINGE | INTRAVENOUS | Status: AC
  Filled 2020-01-25: qty 10

## 2020-01-25 MED ORDER — CIPROFLOXACIN IN D5W 400 MG/200ML IV SOLN
400.0000 mg | INTRAVENOUS | Status: AC
  Administered 2020-01-25: 400 mg via INTRAVENOUS
  Filled 2020-01-25: qty 200

## 2020-01-25 MED ORDER — ARTIFICIAL TEARS OPHTHALMIC OINT
TOPICAL_OINTMENT | OPHTHALMIC | Status: AC
  Filled 2020-01-25: qty 3.5

## 2020-01-25 MED ORDER — MIDAZOLAM HCL 5 MG/5ML IJ SOLN
INTRAMUSCULAR | Status: DC | PRN
  Administered 2020-01-25 (×2): 1 mg via INTRAVENOUS

## 2020-01-25 MED ORDER — FENTANYL CITRATE (PF) 250 MCG/5ML IJ SOLN
INTRAMUSCULAR | Status: DC | PRN
  Administered 2020-01-25 (×2): 50 ug via INTRAVENOUS
  Administered 2020-01-25: 100 ug via INTRAVENOUS
  Administered 2020-01-25: 50 ug via INTRAVENOUS

## 2020-01-25 MED ORDER — 0.9 % SODIUM CHLORIDE (POUR BTL) OPTIME
TOPICAL | Status: DC | PRN
  Administered 2020-01-25: 09:00:00 1000 mL

## 2020-01-25 MED ORDER — EPINEPHRINE PF 1 MG/ML IJ SOLN
INTRAMUSCULAR | Status: AC
  Filled 2020-01-25: qty 1

## 2020-01-25 MED ORDER — BUPIVACAINE HCL (PF) 0.25 % IJ SOLN
INTRAMUSCULAR | Status: DC | PRN
  Administered 2020-01-25: 20 mL

## 2020-01-25 MED ORDER — GABAPENTIN 300 MG PO CAPS
300.0000 mg | ORAL_CAPSULE | ORAL | Status: AC
  Administered 2020-01-25: 300 mg via ORAL
  Filled 2020-01-25: qty 1

## 2020-01-25 MED ORDER — MEPERIDINE HCL 25 MG/ML IJ SOLN: 6.2500 mg | INTRAMUSCULAR | Status: DC | PRN

## 2020-01-25 MED ORDER — MIDAZOLAM HCL 2 MG/2ML IJ SOLN
INTRAMUSCULAR | Status: AC
  Filled 2020-01-25: qty 2

## 2020-01-25 MED ORDER — IOHEXOL 300 MG/ML  SOLN
INTRAMUSCULAR | Status: DC | PRN
  Administered 2020-01-25: 08:00:00 50 mL via INTRATHECAL

## 2020-01-25 MED ORDER — KETOROLAC TROMETHAMINE 30 MG/ML IJ SOLN: 30.0000 mg | Freq: Once | INTRAMUSCULAR | Status: DC | PRN

## 2020-01-25 MED ORDER — LIDOCAINE 2% (20 MG/ML) 5 ML SYRINGE
INTRAMUSCULAR | Status: DC | PRN
  Administered 2020-01-25: 60 mg via INTRAVENOUS

## 2020-01-25 MED ORDER — LACTATED RINGERS IV SOLN: INTRAVENOUS | Status: DC | PRN

## 2020-01-25 MED ORDER — PROPOFOL 10 MG/ML IV BOLUS
INTRAVENOUS | Status: AC
  Filled 2020-01-25: qty 20

## 2020-01-25 MED ORDER — OXYCODONE HCL 5 MG PO TABS
5.0000 mg | ORAL_TABLET | Freq: Once | ORAL | Status: AC | PRN
  Administered 2020-01-25: 5 mg via ORAL

## 2020-01-25 MED ORDER — OXYCODONE HCL 5 MG PO TABS
ORAL_TABLET | ORAL | Status: AC
  Filled 2020-01-25: qty 1

## 2020-01-25 MED ORDER — ONDANSETRON HCL 4 MG/2ML IJ SOLN
INTRAMUSCULAR | Status: DC | PRN
  Administered 2020-01-25: 4 mg via INTRAVENOUS

## 2020-01-25 MED ORDER — HYDROMORPHONE HCL 1 MG/ML IJ SOLN
INTRAMUSCULAR | Status: DC | PRN
  Administered 2020-01-25: .25 mg via INTRAVENOUS

## 2020-01-25 MED ORDER — PROPOFOL 10 MG/ML IV BOLUS
INTRAVENOUS | Status: DC | PRN
  Administered 2020-01-25: 200 mg via INTRAVENOUS

## 2020-01-25 MED ORDER — ACETAMINOPHEN 500 MG PO TABS
1000.0000 mg | ORAL_TABLET | ORAL | Status: AC
  Administered 2020-01-25: 1000 mg via ORAL
  Filled 2020-01-25: qty 2

## 2020-01-25 SURGICAL SUPPLY — 35 items
APPLIER CLIP 5 13 M/L LIGAMAX5 (MISCELLANEOUS) ×2
BLADE CLIPPER SURG (BLADE) IMPLANT
CANISTER SUCT 3000ML PPV (MISCELLANEOUS) ×2 IMPLANT
CATH REDDICK CHOLANGI 4FR 50CM (CATHETERS) ×2 IMPLANT
CHLORAPREP W/TINT 26 (MISCELLANEOUS) ×2 IMPLANT
CLIP APPLIE 5 13 M/L LIGAMAX5 (MISCELLANEOUS) ×1 IMPLANT
COVER MAYO STAND STRL (DRAPES) ×2 IMPLANT
COVER SURGICAL LIGHT HANDLE (MISCELLANEOUS) ×2 IMPLANT
COVER WAND RF STERILE (DRAPES) IMPLANT
DERMABOND ADVANCED (GAUZE/BANDAGES/DRESSINGS) ×1
DERMABOND ADVANCED .7 DNX12 (GAUZE/BANDAGES/DRESSINGS) ×1 IMPLANT
DRAPE C-ARM 42X120 X-RAY (DRAPES) ×2 IMPLANT
ELECT REM PT RETURN 9FT ADLT (ELECTROSURGICAL) ×2
ELECTRODE REM PT RTRN 9FT ADLT (ELECTROSURGICAL) ×1 IMPLANT
GLOVE BIO SURGEON STRL SZ7.5 (GLOVE) ×2 IMPLANT
GOWN STRL REUS W/ TWL LRG LVL3 (GOWN DISPOSABLE) ×3 IMPLANT
GOWN STRL REUS W/TWL LRG LVL3 (GOWN DISPOSABLE) ×3
IV CATH 14GX2 1/4 (CATHETERS) ×2 IMPLANT
KIT BASIN OR (CUSTOM PROCEDURE TRAY) ×2 IMPLANT
KIT TURNOVER KIT B (KITS) ×2 IMPLANT
NS IRRIG 1000ML POUR BTL (IV SOLUTION) ×2 IMPLANT
PAD ARMBOARD 7.5X6 YLW CONV (MISCELLANEOUS) ×2 IMPLANT
POUCH SPECIMEN RETRIEVAL 10MM (ENDOMECHANICALS) ×2 IMPLANT
SCISSORS LAP 5X35 DISP (ENDOMECHANICALS) ×2 IMPLANT
SET IRRIG TUBING LAPAROSCOPIC (IRRIGATION / IRRIGATOR) ×2 IMPLANT
SET TUBE SMOKE EVAC HIGH FLOW (TUBING) ×2 IMPLANT
SLEEVE ENDOPATH XCEL 5M (ENDOMECHANICALS) ×4 IMPLANT
SPECIMEN JAR SMALL (MISCELLANEOUS) ×2 IMPLANT
SUT MNCRL AB 4-0 PS2 18 (SUTURE) ×4 IMPLANT
TOWEL GREEN STERILE (TOWEL DISPOSABLE) ×2 IMPLANT
TOWEL GREEN STERILE FF (TOWEL DISPOSABLE) IMPLANT
TRAY LAPAROSCOPIC MC (CUSTOM PROCEDURE TRAY) ×2 IMPLANT
TROCAR XCEL BLUNT TIP 100MML (ENDOMECHANICALS) ×2 IMPLANT
TROCAR XCEL NON-BLD 5MMX100MML (ENDOMECHANICALS) ×2 IMPLANT
WATER STERILE IRR 1000ML POUR (IV SOLUTION) ×2 IMPLANT

## 2020-01-25 NOTE — Anesthesia Postprocedure Evaluation (Signed)
Anesthesia Post Note  Patient: Sarah [name]  Procedure(s) Performed: LAPAROSCOPIC CHOLECYSTECTOMY WITH INTRAOPERATIVE CHOLANGIOGRAM (N/A )     Patient location during evaluation: PACU Anesthesia Type: General Level of consciousness: awake and alert, oriented and patient cooperative Pain management: pain level controlled Vital Signs Assessment: post-procedure vital signs reviewed and stable Respiratory status: spontaneous breathing, nonlabored ventilation and respiratory function stable Cardiovascular status: blood pressure returned to baseline and stable Postop Assessment: no apparent nausea or vomiting Anesthetic complications: no    Last Vitals:  Vitals:   01/25/20 1006 01/25/20 1020  BP: 93/76 113/75  Pulse: (!) 115 96  Resp: 15 17  Temp: (!) 36.3 C 36.4 C  SpO2: 100% 100%    Last Pain:  Vitals:   01/25/20 1020  PainSc: 0-No pain                 Tennis Must Louine Tenpenny

## 2020-01-25 NOTE — Transfer of Care (Signed)
Immediate Anesthesia Transfer of Care Note  Patient: Sarah [name]  Procedure(s) Performed: LAPAROSCOPIC CHOLECYSTECTOMY WITH INTRAOPERATIVE CHOLANGIOGRAM (N/A )  Patient Location: PACU  Anesthesia Type:General  Level of Consciousness: awake, alert  and oriented  Airway & Oxygen Therapy: Patient Spontanous Breathing  Post-op Assessment: Report given to RN and Post -op Vital signs reviewed and stable  Post vital signs: Reviewed and stable  Last Vitals:  Vitals Value Taken Time  BP 93/76 01/25/20 1006  Temp    Pulse 115 01/25/20 1006  Resp 15 01/25/20 1006  SpO2 100 % 01/25/20 1006  Vitals shown include unvalidated device data.  Last Pain:  Vitals:   01/25/20 0710  PainSc: 3          Complications: No apparent anesthesia complications

## 2020-01-25 NOTE — Interval H&P Note (Signed)
History and Physical Interval Note:  01/25/2020 8:13 AM  Sarah [name]  has presented today for surgery, with the diagnosis of GALLSTONES.  The various methods of treatment have been discussed with the patient and family. After consideration of risks, benefits and other options for treatment, the patient has consented to  Procedure(s): LAPAROSCOPIC CHOLECYSTECTOMY WITH INTRAOPERATIVE CHOLANGIOGRAM (N/A) as a surgical intervention.  The patient's history has been reviewed, patient examined, no change in status, stable for surgery.  I have reviewed the patient's chart and labs.  Questions were answered to the patient's satisfaction.     Chevis Pretty III

## 2020-01-25 NOTE — H&P (Signed)
Sarah [name]  Location: [facility] Patient #: [id number] DOB: December 04, 1990 Single / Language: [place] / Race: White Female   History of Present Illness  The patient is a 29 year old female who presents for a follow-up for Abdominal pain. The patient is a 29 year old white female who we saw several months ago while she was pregnant for symptomatic gallstones. She is now 2 weeks postpartum and her baby is healthy. She has known gallstones. She continues to have intermittent painful episodes. The pain she describes is right upper quadrant and fairly severe. The pain radiates up into her chest and into her back. The pain is been occasionally associated with nausea and vomiting. A recent ultrasound did show stones in the gallbladder but no gallbladder wall thickening or ductal dilatation. Her liver functions were essentially normal with just slight elevation of the SGOT and SGPT.   Allergies  Penicillins  Biaxin *MACROLIDES*  Zithromax *MACROLIDES*  Allergies Reconciled   Medication History  NP Thyroid (30MG  Tablet, Oral) Active. Prenatal (Oral) Active. Medications Reconciled    Review of Systems  General Present- Weight Gain. Not Present- Appetite Loss, Chills, Fatigue, Fever, Night Sweats and Weight Loss. Skin Present- Dryness. Not Present- Change in Wart/Mole, Hives, Jaundice, New Lesions, Non-Healing Wounds, Rash and Ulcer. HEENT Present- Seasonal Allergies. Not Present- Earache, Hearing Loss, Hoarseness, Nose Bleed, Oral Ulcers, Ringing in the Ears, Sinus Pain, Sore Throat, Visual Disturbances, Wears glasses/contact lenses and Yellow Eyes. Breast Not Present- Breast Mass, Breast Pain, Nipple Discharge and Skin Changes. Cardiovascular Present- Shortness of Breath. Not Present- Chest Pain, Difficulty Breathing Lying Down, Leg Cramps, Palpitations, Rapid Heart Rate and Swelling of Extremities. Gastrointestinal Present- Nausea. Not Present- Abdominal Pain,  Bloating, Bloody Stool, Change in Bowel Habits, Chronic diarrhea, Constipation, Difficulty Swallowing, Excessive gas, Gets full quickly at meals, Hemorrhoids, Indigestion, Rectal Pain and Vomiting. Female Genitourinary Present- Painful Urination. Not Present- Frequency, Nocturia, Pelvic Pain and Urgency. Musculoskeletal Not Present- Back Pain, Joint Pain, Joint Stiffness, Muscle Pain, Muscle Weakness and Swelling of Extremities. Neurological Present- Headaches. Not Present- Decreased Memory, Fainting, Numbness, Seizures, Tingling, Tremor, Trouble walking and Weakness. Psychiatric Present- Anxiety. Not Present- Bipolar, Change in Sleep Pattern, Depression, Fearful and Frequent crying. Endocrine Present- Heat Intolerance. Not Present- Cold Intolerance, Excessive Hunger, Hair Changes, Hot flashes and New Diabetes. Hematology Not Present- Blood Thinners, Easy Bruising, Excessive bleeding, Gland problems, HIV and Persistent Infections.  Vitals  Weight: 165.8 lb Height: 64in Body Surface Area: 1.81 m Body Mass Index: 28.46 kg/m  Temp.: 97.3F(Tympanic)  Pulse: 103 (Regular)  BP: 110/72 (Sitting, Left Arm, Standard)       Physical Exam  General Mental Status-Alert. General Appearance-Consistent with stated age. Hydration-Well hydrated. Voice-Normal.  Head and Neck Head-normocephalic, atraumatic with no lesions or palpable masses. Trachea-midline. Thyroid Gland Characteristics - normal size and consistency.  Eye Eyeball - Bilateral-Extraocular movements intact. Sclera/Conjunctiva - Bilateral-No scleral icterus.  Chest and Lung Exam Chest and lung exam reveals -quiet, even and easy respiratory effort with no use of accessory muscles and on auscultation, normal breath sounds, no adventitious sounds and normal vocal resonance. Inspection Chest Wall - Normal. Back - normal.  Cardiovascular Cardiovascular examination reveals -normal heart sounds,  regular rate and rhythm with no murmurs and normal pedal pulses bilaterally.  Abdomen Note: The abdomen is soft with mild right upper quadrant tenderness. There is no palpable mass. There are no surgical scars.   Neurologic Neurologic evaluation reveals -alert and oriented x 3 with no impairment of recent or remote  memory. Mental Status-Normal.  Musculoskeletal Normal Exam - Left-Upper Extremity Strength Normal and Lower Extremity Strength Normal. Normal Exam - Right-Upper Extremity Strength Normal and Lower Extremity Strength Normal.  Lymphatic Head & Neck  General Head & Neck Lymphatics: Bilateral - Description - Normal. Axillary  General Axillary Region: Bilateral - Description - Normal. Tenderness - Non Tender. Femoral & Inguinal  Generalized Femoral & Inguinal Lymphatics: Bilateral - Description - Normal. Tenderness - Non Tender.    Assessment & Plan  CALCULUS OF GALLBLADDER WITHOUT CHOLECYSTITIS WITHOUT OBSTRUCTION (K80.20) Impression: The patient appears to have symptomatic gallstones. Because of the risk of further painful episodes and possible pancreatitis or think she would benefit from having her gallbladder removed. She would also like to have this done. I have discussed with her in detail the risks and benefits of the operation to remove the gallbladder as well as some of the technical aspects including the risk of common duct injury and she understands and wishes to proceed. She is now 2 weeks postpartum and her baby is healthy. This patient encounter took 20 minutes today to perform the following: take history, perform exam, review outside records, interpret imaging, counsel the patient on their diagnosis and document encounter, findings & plan in the EHR

## 2020-01-25 NOTE — Anesthesia Procedure Notes (Signed)
Procedure Name: Intubation Date/Time: 01/25/2020 8:38 AM Performed by: Harden Mo, CRNA Pre-anesthesia Checklist: Patient identified, Emergency Drugs available, Suction available and Patient being monitored Patient Re-evaluated:Patient Re-evaluated prior to induction Oxygen Delivery Method: Circle System Utilized Preoxygenation: Pre-oxygenation with 100% oxygen Induction Type: IV induction Ventilation: Mask ventilation without difficulty Laryngoscope Size: Mac and 3 Grade View: Grade I Tube type: Oral Tube size: 7.0 mm Number of attempts: 1 Airway Equipment and Method: Stylet and Oral airway Placement Confirmation: ETT inserted through vocal cords under direct vision,  positive ETCO2 and breath sounds checked- equal and bilateral Secured at: 23 cm Tube secured with: Tape Dental Injury: Teeth and Oropharynx as per pre-operative assessment

## 2020-01-25 NOTE — Op Note (Signed)
01/25/2020  9:54 AM  PATIENT:  Sarah [name]  29 y.o. female  PRE-OPERATIVE DIAGNOSIS:  GALLSTONES  POST-OPERATIVE DIAGNOSIS:  GALLSTONES  PROCEDURE:  Procedure(s): LAPAROSCOPIC CHOLECYSTECTOMY WITH INTRAOPERATIVE CHOLANGIOGRAM (N/A)  SURGEON:  Surgeon(s) and Role:    * Griselda Miner, MD - Primary  PHYSICIAN ASSISTANT:   ASSISTANTS: Patrici Ranks, RNFA   ANESTHESIA:   local and general  EBL:  10 mL   BLOOD ADMINISTERED:none  DRAINS: none   LOCAL MEDICATIONS USED:  MARCAINE     SPECIMEN:  Source of Specimen:  gallbladder  DISPOSITION OF SPECIMEN:  PATHOLOGY  COUNTS:  YES  TOURNIQUET:  * No tourniquets in log *  DICTATION: .Dragon Dictation     Procedure: After informed consent was obtained the patient was brought to the operating room and placed in the supine position on the operating room table. After adequate induction of general anesthesia the patient's abdomen was prepped with ChloraPrep allowed to dry and draped in usual sterile manner. An appropriate timeout was performed. The area below the umbilicus was infiltrated with quarter percent  Marcaine. A small incision was made with a 15 blade knife. The incision was carried down through the subcutaneous tissue bluntly with a hemostat and Army-Navy retractors. The linea alba was identified. The linea alba was incised with a 15 blade knife and each side was grasped with Coker clamps. The preperitoneal space was then probed with a hemostat until the peritoneum was opened and access was gained to the abdominal cavity. A 0 Vicryl pursestring stitch was placed in the fascia surrounding the opening. A Hassan cannula was then placed through the opening and anchored in place with the previously placed Vicryl purse string stitch. The abdomen was insufflated with carbon dioxide without difficulty. A laparoscope was inserted through the [facility] cannula in the right upper quadrant was inspected. Next the epigastric region was  infiltrated with % Marcaine. A small incision was made with a 15 blade knife. A 5 mm port was placed bluntly through this incision into the abdominal cavity under direct vision. Next 2 sites were chosen laterally on the right side of the abdomen for placement of 5 mm ports. Each of these areas was infiltrated with quarter percent Marcaine. Small stab incisions were made with a 15 blade knife. 5 mm ports were then placed bluntly through these incisions into the abdominal cavity under direct vision without difficulty. A blunt grasper was placed through the lateralmost 5 mm port and used to grasp the dome of the gallbladder and elevated anteriorly and superiorly. Another blunt grasper was placed through the other 5 mm port and used to retract the body and neck of the gallbladder. A dissector was placed through the epigastric port and using the electrocautery the peritoneal reflection at the gallbladder neck was opened. Blunt dissection was then carried out in this area until the gallbladder neck-cystic duct junction was readily identified and a good window was created. A single clip was placed on the gallbladder neck. A small  ductotomy was made just below the clip with laparoscopic scissors. A 14-gauge Angiocath was then placed through the anterior abdominal wall under direct vision. A Reddick cholangiogram catheter was then placed through the Angiocath and flushed. The catheter was then placed in the cystic duct and anchored in place with a clip. A cholangiogram was obtained that showed no filling defects good emptying into the duodenum an adequate length on the cystic duct. The anchoring clip and catheters were then removed from the patient. 3  clips were placed proximally on the cystic duct and the duct was divided between the 2 sets of clips. Posterior to this the cystic artery was identified and again dissected bluntly in a circumferential manner until a good window  was created. 2 clips were placed proximally  and one distally on the artery and the artery was divided between the 2 sets of clips. Next a laparoscopic hook cautery device was used to separate the gallbladder from the liver bed. Prior to completely detaching the gallbladder from the liver bed the liver bed was inspected and several small bleeding points were coagulated with the electrocautery until the area was completely hemostatic. The gallbladder was then detached the rest of it from the liver bed without difficulty. A laparoscopic bag was inserted through the hassan port. The laparoscope was moved to the epigastric port. The gallbladder was placed within the bag and the bag was sealed.  The bag with the gallbladder was then removed with the [facility] cannula through the infraumbilical port without difficulty. The fascial defect was then closed with the previously placed Vicryl pursestring stitch as well as with another figure-of-eight 0 Vicryl stitch. The liver bed was inspected again and found to be hemostatic. The abdomen was irrigated with copious amounts of saline until the effluent was clear. The ports were then removed under direct vision without difficulty and were found to be hemostatic. The gas was allowed to escape. The skin incisions were all closed with interrupted 4-0 Monocryl subcuticular stitches. Dermabond dressings were applied. The patient tolerated the procedure well. At the end of the case all needle sponge and instrument counts were correct. The patient was then awakened and taken to recovery in stable condition  PLAN OF CARE: Discharge to home after PACU  PATIENT DISPOSITION:  PACU - hemodynamically stable.   Delay start of Pharmacological VTE agent (>24hrs) due to surgical blood loss or risk of bleeding: not applicable

## 2020-01-26 LAB — SURGICAL PATHOLOGY

## 2020-02-04 ENCOUNTER — Other Ambulatory Visit: Payer: Self-pay | Admitting: General Surgery

## 2020-02-04 DIAGNOSIS — R0789 Other chest pain: Secondary | ICD-10-CM

## 2020-02-04 DIAGNOSIS — R109 Unspecified abdominal pain: Secondary | ICD-10-CM

## 2020-02-05 ENCOUNTER — Ambulatory Visit (HOSPITAL_COMMUNITY)
Admission: RE | Admit: 2020-02-05 | Discharge: 2020-02-05 | Disposition: A | Discharge status: Home / Self Care | Payer: 59 | Source: Ambulatory Visit | Attending: General Surgery | Admitting: General Surgery

## 2020-02-05 DIAGNOSIS — R109 Unspecified abdominal pain: Secondary | ICD-10-CM | POA: Diagnosis not present

## 2020-02-05 DIAGNOSIS — R0789 Other chest pain: Secondary | ICD-10-CM | POA: Diagnosis present

## 2020-02-05 MED ORDER — IOHEXOL 9 MG/ML PO SOLN
ORAL | Status: AC
  Filled 2020-02-05: qty 1000

## 2020-02-05 MED ORDER — IOHEXOL 300 MG/ML  SOLN
100.0000 mL | Freq: Once | INTRAMUSCULAR | Status: AC | PRN
  Administered 2020-02-05: 17:00:00 100 mL via INTRAVENOUS

## 2020-02-05 MED ORDER — SODIUM CHLORIDE (PF) 0.9 % IJ SOLN
INTRAMUSCULAR | Status: AC
  Filled 2020-02-05: qty 50

## 2020-02-05 MED ORDER — IOHEXOL 9 MG/ML PO SOLN
500.0000 mL | ORAL | Status: AC
  Administered 2020-02-05 (×2): 500 mL via ORAL

## 2020-02-08 ENCOUNTER — Encounter: Payer: Self-pay | Admitting: Family Medicine

## 2020-02-08 ENCOUNTER — Other Ambulatory Visit: Payer: Self-pay | Admitting: Advanced Practice Midwife

## 2020-02-08 DIAGNOSIS — E039 Hypothyroidism, unspecified: Secondary | ICD-10-CM

## 2020-02-08 MED ORDER — NP THYROID 30 MG PO TABS: 30.0000 mg | ORAL_TABLET | Freq: Every day | ORAL | 0 refills | Status: DC

## 2020-02-25 IMAGING — US ULTRASOUND ABDOMEN LIMITED
1 series · 14 of 25 positions shown · non-contrast
Comparison: None.

CLINICAL DATA: Upper abdominal pain, nausea and shortness of
breath.

EXAM:
ULTRASOUND ABDOMEN LIMITED RIGHT UPPER QUADRANT

[Series 1: ultrasound abdomen limited · 14 of 60 slices shown]
[im 1/60]
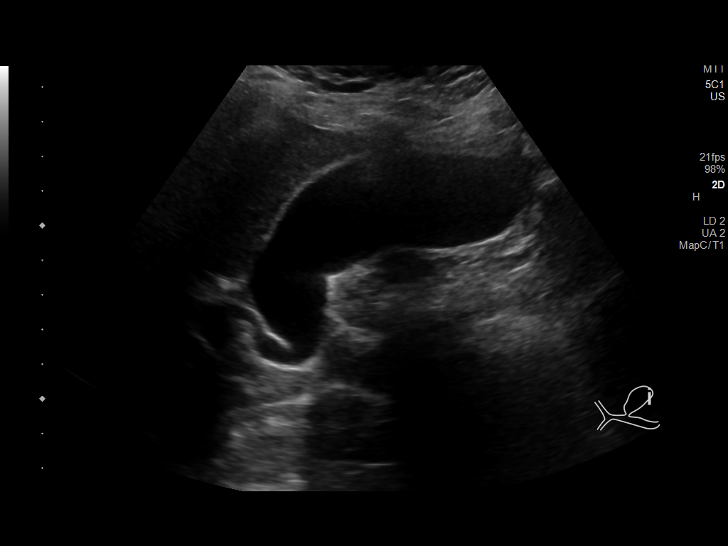
[im 5/60]
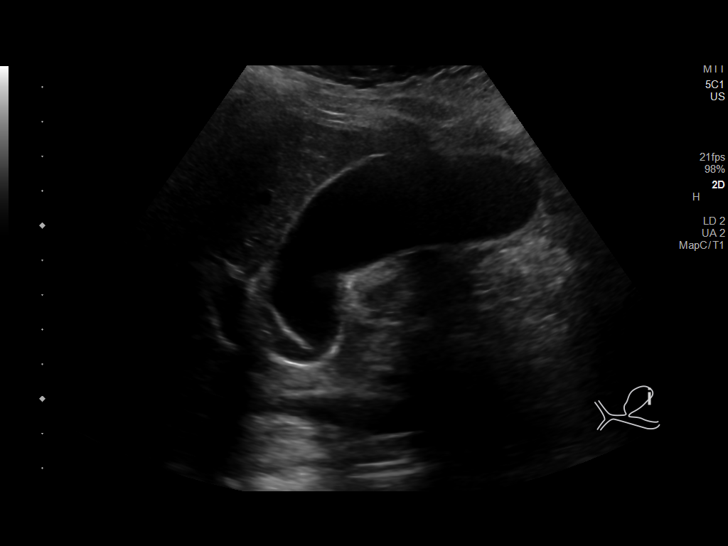
[im 10/60]
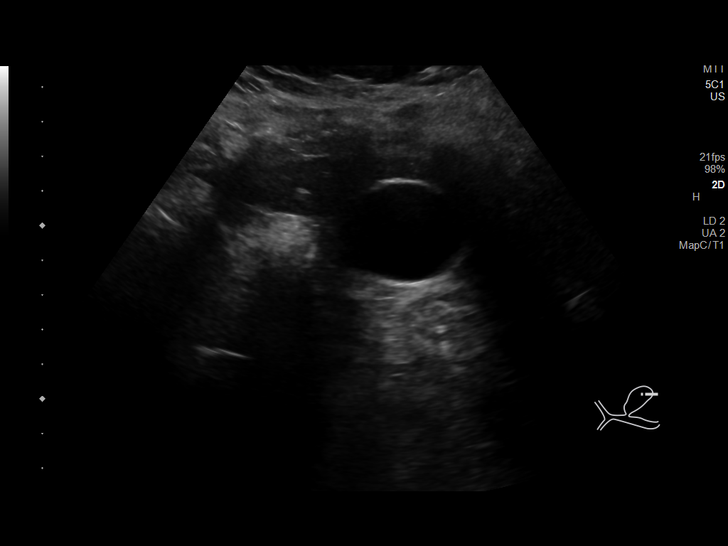
[im 15/60]
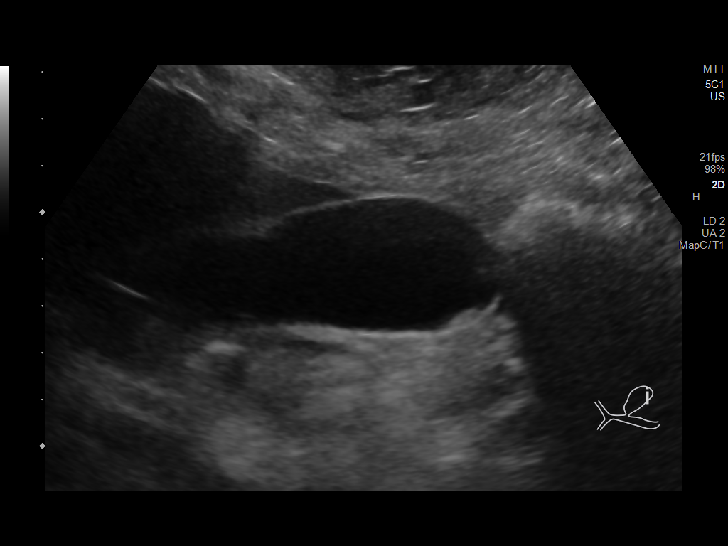
[im 20/60]
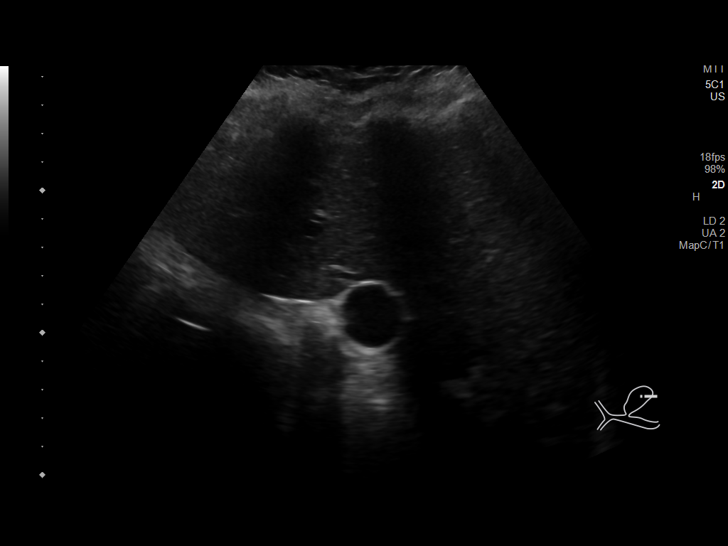
[im 23/60]
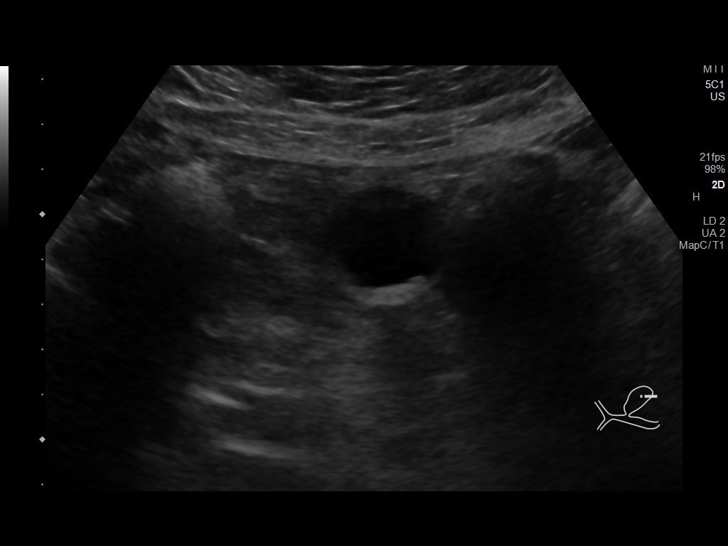
[im 28/60]
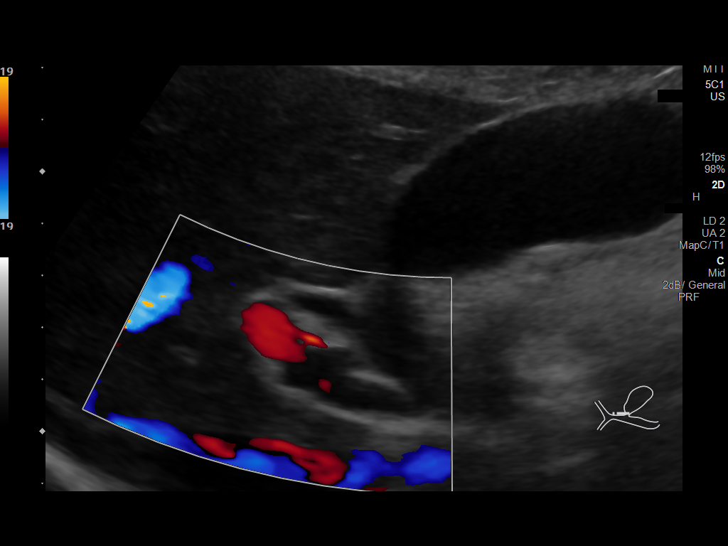
[im 32/60]
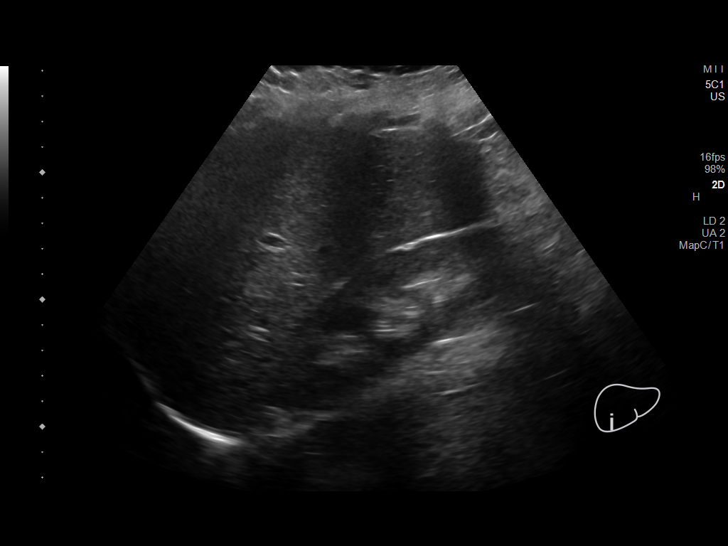
[im 37/60]
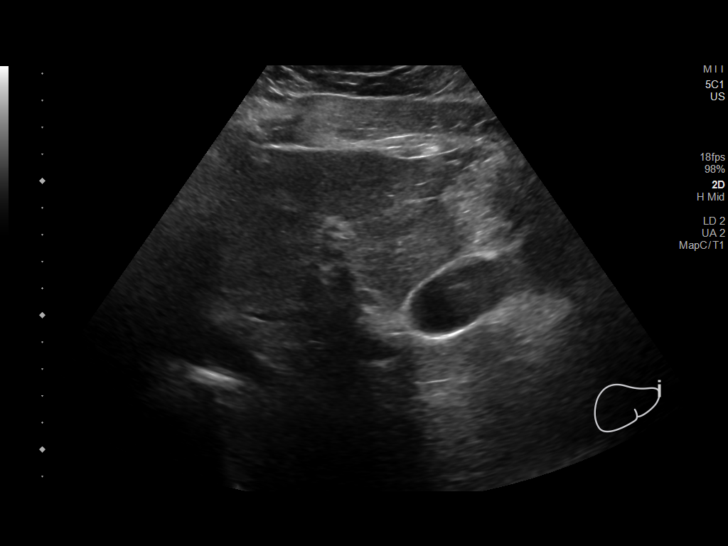
[im 40/60]
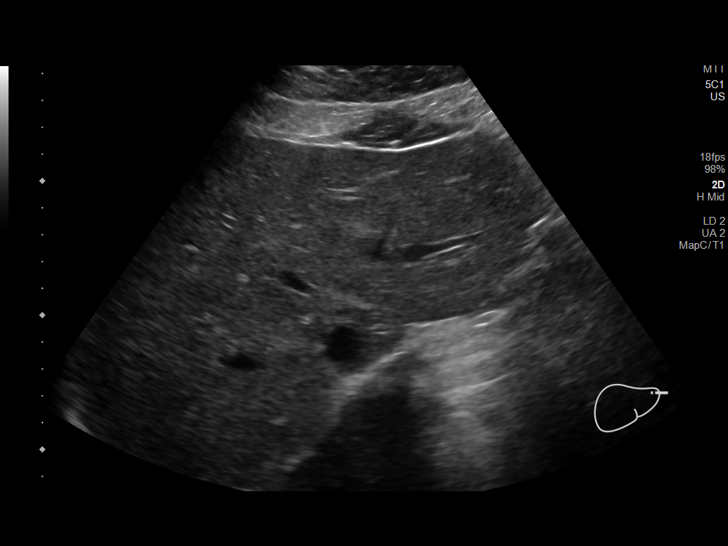
[im 45/60]
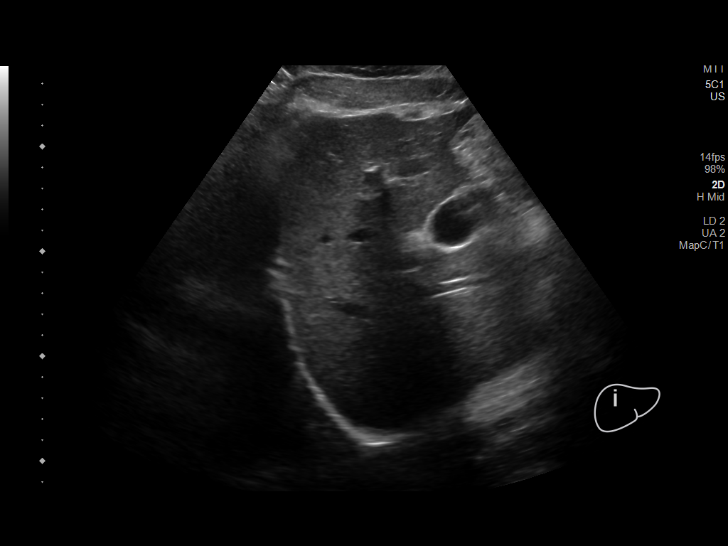
[im 50/60]
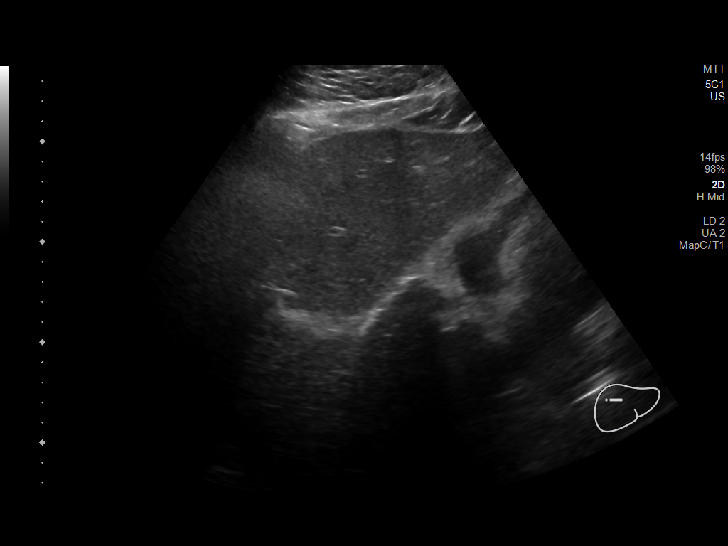
[im 55/60]
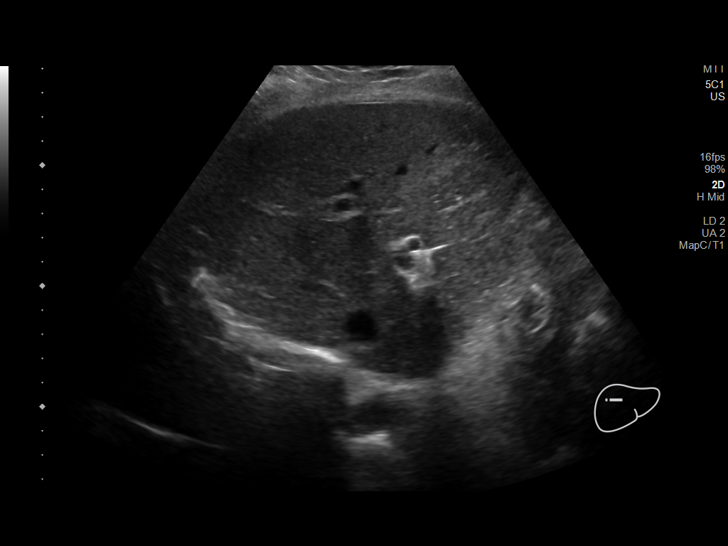
[im 60/60]
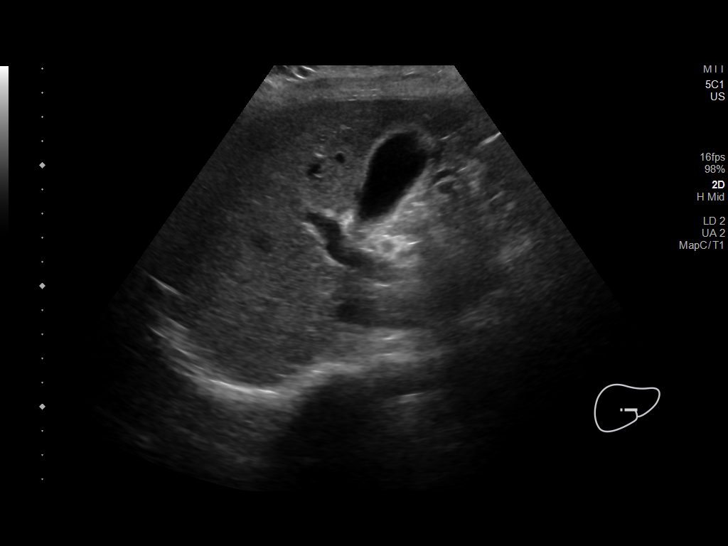

[14 of 25 positions shown; findings below may reference images not displayed]

FINDINGS: Gallbladder:

Small amount of sludge and a 7 mm mobile gallstone. No gallbladder
wall thickening visualized. No sonographic Murphy sign noted by
sonographer.

Common bile duct:

Diameter: 4.5 mm

Liver:

No focal lesion identified. Within normal limits in parenchymal
echogenicity. Portal vein is patent on color Doppler imaging with
normal direction of blood flow towards the liver.
IMPRESSION: Cholelithiasis without evidence of acute cholecystitis.

Normal appearance of the liver.

## 2020-06-01 ENCOUNTER — Telehealth: Payer: Self-pay | Admitting: Family Medicine

## 2020-06-01 NOTE — Telephone Encounter (Signed)
Ok for [facility] due to pt moving?   Pt would like to see Malva Cogan

## 2020-06-01 NOTE — Telephone Encounter (Signed)
Yes of course. Ok with me.

## 2020-06-01 NOTE — Telephone Encounter (Signed)
Ok with me 

## 2020-06-10 ENCOUNTER — Encounter: Payer: Self-pay | Admitting: Physician Assistant

## 2020-06-10 ENCOUNTER — Other Ambulatory Visit: Payer: Self-pay

## 2020-06-10 ENCOUNTER — Ambulatory Visit: Payer: BLUE CROSS/BLUE SHIELD | Admitting: Physician Assistant

## 2020-06-10 VITALS — BP 108/72 | HR 71 | Temp 98.6°F | Wt 170.0 lb

## 2020-06-10 DIAGNOSIS — Z1159 Encounter for screening for other viral diseases: Secondary | ICD-10-CM | POA: Diagnosis not present

## 2020-06-10 DIAGNOSIS — Z111 Encounter for screening for respiratory tuberculosis: Secondary | ICD-10-CM | POA: Diagnosis not present

## 2020-06-10 DIAGNOSIS — Z0000 Encounter for general adult medical examination without abnormal findings: Secondary | ICD-10-CM

## 2020-06-10 DIAGNOSIS — E039 Hypothyroidism, unspecified: Secondary | ICD-10-CM | POA: Diagnosis not present

## 2020-06-10 NOTE — Progress Notes (Signed)
Patient presents to clinic today for annual exam.  She is transferring care from Dr. Barron Alvine.  Patient is fasting for labs.  Acute Concerns: Denies acute concerns today.  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  Chronic Issues: Hypothyroidism --patient currently on a regimen of NP thyroid 30 mg daily.  Endorses taking medications as directed.  Notes it has been sometime since her TSH level was checked.  Denies any change to bowel habits or heat intolerance.  Notes mood stable.  Is resting well.  Health Maintenance: Immunizations -- up-to-date PAP -- up-to-date   Past Medical History:  Diagnosis Date  . Anxiety   . Bell's palsy   . Gall stones   . Gestational hypertension 10/24/2019  . Headache    migraines  . Hypothyroidism   . Ovarian cyst   . Thyroid disease     Past Surgical History:  Procedure Laterality Date  . CHOLECYSTECTOMY N/A 01/25/2020   Procedure: LAPAROSCOPIC CHOLECYSTECTOMY WITH INTRAOPERATIVE CHOLANGIOGRAM;  Surgeon: Griselda Miner, MD;  Location: [facility] OR;  Service: General;  Laterality: N/A;  . LASIK  2017  . NO PAST SURGERIES    . wisdom tooth removal      Current Outpatient Medications on File Prior to Visit  Medication Sig Dispense Refill  . acetaminophen (TYLENOL) 325 MG tablet Take 2 tablets (650 mg total) by mouth every 4 (four) hours as needed (for pain scale < 4). (Patient not taking: Reported on 01/13/2020) 30 tablet 0  . Elastic Bandages & Supports (COMFORT FIT MATERNITY SUPP LG) MISC 1 Units by Does not apply route daily as needed. 1 each 0  . fexofenadine (ALLEGRA) 180 MG tablet Take 180 mg by mouth daily.    . fluticasone (FLONASE) 50 MCG/ACT nasal spray Place 2 sprays into both nostrils daily as needed for allergies or rhinitis.    Marland Kitchen HYDROcodone-acetaminophen (NORCO/VICODIN) 5-325 MG tablet Take 1-2 tablets by mouth every 6 (six) hours as needed for moderate pain or severe pain. 15 tablet 0  . ibuprofen (ADVIL) 600 MG tablet Take 1  tablet (600 mg total) by mouth every 6 (six) hours. (Patient not taking: Reported on 01/13/2020) 30 tablet 0  . Ketotifen Fumarate (ZADITOR OP) Apply 0.035 % to eye.    . Multiple Vitamin (MULTIVITAMIN WITH MINERALS) TABS tablet Take 1 tablet by mouth daily.    . NP THYROID 30 MG tablet Take 1 tablet (30 mg total) by mouth daily before breakfast. 90 tablet 0  . Prenat w/o A Vit-FeFum-FePo-FA (CONCEPT OB) 130-92.4-1 MG CAPS Take 1 capsule by mouth daily. (Patient not taking: Reported on 01/13/2020) 30 capsule 11  . traMADol (ULTRAM) 50 MG tablet Take 50 mg by mouth every 6 (six) hours as needed for moderate pain.     No current facility-administered medications on file prior to visit.    Allergies  Allergen Reactions  . Azithromycin     GI-- stomach cramps  . Penicillins Hives    Did it involve swelling of the face/tongue/throat, SOB, or low BP? No Did it involve sudden or severe rash/hives, skin peeling, or any reaction on the inside of your mouth or nose? No Did you need to seek medical attention at a hospital or doctor's office? No When did it last happen?3-4 years ago If all above answers are "NO", may proceed with cephalosporin use.     Family History  Problem Relation Age of Onset  . Hypertension Mother   . Hashimoto's thyroiditis  Mother   . Cancer Maternal Grandmother        bone marrow cancer    Social History   Socioeconomic History  . Marital status: Married    Spouse name: Not on file  . Number of children: Not on file  . Years of education: Not on file  . Highest education level: Not on file  Occupational History  . Not on file  Tobacco Use  . Smoking status: Never Smoker  . Smokeless tobacco: Never Used  Vaping Use  . Vaping Use: Never used  Substance and Sexual Activity  . Alcohol use: Not Currently    Comment: socially  . Drug use: Never  . Sexual activity: Yes    Birth control/protection: None  Other Topics Concern  . Not on file  Social  History Narrative  . Not on file   Social Determinants of Health   Financial Resource Strain:   . Difficulty of Paying Living Expenses: Not on file  Food Insecurity:   . Worried About Programme researcher, broadcasting/film/video in the Last Year: Not on file  . Ran Out of Food in the Last Year: Not on file  Transportation Needs:   . Lack of Transportation (Medical): Not on file  . Lack of Transportation (Non-Medical): Not on file  Physical Activity:   . Days of Exercise per Week: Not on file  . Minutes of Exercise per Session: Not on file  Stress:   . Feeling of Stress : Not on file  Social Connections:   . Frequency of Communication with Friends and Family: Not on file  . Frequency of Social Gatherings with Friends and Family: Not on file  . Attends Religious Services: Not on file  . Active Member of Clubs or Organizations: Not on file  . Attends Banker Meetings: Not on file  . Marital Status: Not on file  Intimate Partner Violence:   . Fear of Current or Ex-Partner: Not on file  . Emotionally Abused: Not on file  . Physically Abused: Not on file  . Sexually Abused: Not on file   Review of Systems  Constitutional: Negative for fever and weight loss.  HENT: Negative for ear discharge, ear pain, hearing loss and tinnitus.   Eyes: Negative for blurred vision, double vision, photophobia and pain.  Respiratory: Negative for cough and shortness of breath.   Cardiovascular: Negative for chest pain and palpitations.  Gastrointestinal: Negative for abdominal pain, blood in stool, constipation, diarrhea, heartburn, melena, nausea and vomiting.  Genitourinary: Negative for dysuria, flank pain, frequency, hematuria and urgency.  Musculoskeletal: Negative for falls.  Neurological: Negative for dizziness, loss of consciousness and headaches.  Endo/Heme/Allergies: Negative for environmental allergies.  Psychiatric/Behavioral: Negative for depression, hallucinations, substance abuse and suicidal  ideas. The patient is not nervous/anxious and does not have insomnia.     Wt 170 lb (77.1 kg)   BMI 29.18 kg/m   Physical Exam Vitals reviewed.  HENT:     Head: Normocephalic and atraumatic.     Right Ear: Tympanic membrane, ear canal and external ear normal.     Left Ear: Tympanic membrane, ear canal and external ear normal.     Nose: Nose normal. No mucosal edema.     Mouth/Throat:     Pharynx: Uvula midline. No oropharyngeal exudate or posterior oropharyngeal erythema.  Eyes:     Conjunctiva/sclera: Conjunctivae normal.     Pupils: Pupils are equal, round, and reactive to light.  Neck:     Thyroid:  No thyromegaly.  Cardiovascular:     Rate and Rhythm: Normal rate and regular rhythm.     Heart sounds: Normal heart sounds.  Pulmonary:     Effort: Pulmonary effort is normal. No respiratory distress.     Breath sounds: Normal breath sounds. No wheezing or rales.  Abdominal:     General: Bowel sounds are normal. There is no distension.     Palpations: Abdomen is soft. There is no mass.     Tenderness: There is no abdominal tenderness. There is no guarding or rebound.  Musculoskeletal:     Cervical back: Neck supple.  Lymphadenopathy:     Cervical: No cervical adenopathy.  Skin:    General: Skin is warm and dry.     Findings: No rash.  Neurological:     Mental Status: She is alert and oriented to person, place, and time.     Cranial Nerves: No cranial nerve deficit.    Assessment/Plan: 1. Visit for preventive health examination Depression screen negative. Health Maintenance reviewed. Preventive schedule discussed and handout given in AVS. Will obtain fasting labs today.   2. Hypothyroidism, unspecified type Repeat TSH levels today.  Will alter dose of thyroid medication based on results.  3. Screening-pulmonary TB TB Gold assay obtained today.  Forms completed for her employer.  4. Need for hepatitis C screening test Due per screening guidelines.  Obtained  today.  This visit occurred during the SARS-CoV-2 public health emergency.  Safety protocols were in place, including screening questions prior to the visit, additional usage of staff PPE, and extensive cleaning of exam room while observing appropriate contact time as indicated for disinfecting solutions.     Piedad Climes, PA-C

## 2020-06-10 NOTE — Patient Instructions (Signed)
Please go to the lab for blood work.   Our office will call you with your results unless you have chosen to receive results via MyChart.  If your blood work is normal we will follow-up each year for physicals and as scheduled for chronic medical problems.  If anything is abnormal we will treat accordingly and get you in for a follow-up.  We will adjust your thyroid medication dose if indicated by lab results.   It was very nice meeting you today. Welcome to [facility]!   Preventive Care 30-50 Years Old, Female Preventive care refers to visits with your health care provider and lifestyle choices that can promote health and wellness. This includes:  A yearly physical exam. This may also be called an annual well check.  Regular dental visits and eye exams.  Immunizations.  Screening for certain conditions.  Healthy lifestyle choices, such as eating a healthy diet, getting regular exercise, not using drugs or products that contain nicotine and tobacco, and limiting alcohol use. What can I expect for my preventive care visit? Physical exam Your health care provider will check your:  Height and weight. This may be used to calculate body mass index (BMI), which tells if you are at a healthy weight.  Heart rate and blood pressure.  Skin for abnormal spots. Counseling Your health care provider may ask you questions about your:  Alcohol, tobacco, and drug use.  Emotional well-being.  Home and relationship well-being.  Sexual activity.  Eating habits.  Work and work Statistician.  Method of birth control.  Menstrual cycle.  Pregnancy history. What immunizations do I need?  Influenza (flu) vaccine  This is recommended every year. Tetanus, diphtheria, and pertussis (Tdap) vaccine  You may need a Td booster every 10 years. Varicella (chickenpox) vaccine  You may need this if you have not been vaccinated. Human papillomavirus (HPV) vaccine  If recommended by your  health care provider, you may need three doses over 6 months. Measles, mumps, and rubella (MMR) vaccine  You may need at least one dose of MMR. You may also need a second dose. Meningococcal conjugate (MenACWY) vaccine  One dose is recommended if you are age 30-21 years and a first-year college student living in a residence hall, or if you have one of several medical conditions. You may also need additional booster doses. Pneumococcal conjugate (PCV13) vaccine  You may need this if you have certain conditions and were not previously vaccinated. Pneumococcal polysaccharide (PPSV23) vaccine  You may need one or two doses if you smoke cigarettes or if you have certain conditions. Hepatitis A vaccine  You may need this if you have certain conditions or if you travel or work in places where you may be exposed to hepatitis A. Hepatitis B vaccine  You may need this if you have certain conditions or if you travel or work in places where you may be exposed to hepatitis B. Haemophilus influenzae type b (Hib) vaccine  You may need this if you have certain conditions. You may receive vaccines as individual doses or as more than one vaccine together in one shot (combination vaccines). Talk with your health care provider about the risks and benefits of combination vaccines. What tests do I need?  Blood tests  Lipid and cholesterol levels. These may be checked every 5 years starting at age 30.  Hepatitis C test.  Hepatitis B test. Screening  Diabetes screening. This is done by checking your blood sugar (glucose) after you have not eaten  for a while (fasting).  Sexually transmitted disease (STD) testing.  BRCA-related cancer screening. This may be done if you have a family history of breast, ovarian, tubal, or peritoneal cancers.  Pelvic exam and Pap test. This may be done every 3 years starting at age 30 Starting at age 30, this may be done every 5 years if you have a Pap test in  combination with an HPV test. Talk with your health care provider about your test results, treatment options, and if necessary, the need for more tests. Follow these instructions at home: Eating and drinking   Eat a diet that includes fresh fruits and vegetables, whole grains, lean protein, and low-fat dairy.  Take vitamin and mineral supplements as recommended by your health care provider.  Do not drink alcohol if: ? Your health care provider tells you not to drink. ? You are pregnant, may be pregnant, or are planning to become pregnant.  If you drink alcohol: ? Limit how much you have to 0-1 drink a day. ? Be aware of how much alcohol is in your drink. In the [place] one drink equals one 12 oz bottle of beer (355 mL), one 5 oz glass of wine (148 mL), or one 1 oz glass of hard liquor (44 mL). Lifestyle  Take daily care of your teeth and gums.  Stay active. Exercise for at least 30 minutes on 5 or more days each week.  Do not use any products that contain nicotine or tobacco, such as cigarettes, e-cigarettes, and chewing tobacco. If you need help quitting, ask your health care provider.  If you are sexually active, practice safe sex. Use a condom or other form of birth control (contraception) in order to prevent pregnancy and STIs (sexually transmitted infections). If you plan to become pregnant, see your health care provider for a preconception visit. What's next?  Visit your health care provider once a year for a well check visit.  Ask your health care provider how often you should have your eyes and teeth checked.  Stay up to date on all vaccines. This information is not intended to replace advice given to you by your health care provider. Make sure you discuss any questions you have with your health care provider. Document Revised: 06/19/2018 Document Reviewed: 06/19/2018 Elsevier Patient Education  [employer]. .

## 2020-06-12 ENCOUNTER — Encounter: Payer: Self-pay | Admitting: Physician Assistant

## 2020-06-12 ENCOUNTER — Other Ambulatory Visit: Payer: Self-pay | Admitting: Physician Assistant

## 2020-06-12 DIAGNOSIS — E039 Hypothyroidism, unspecified: Secondary | ICD-10-CM

## 2020-06-12 MED ORDER — NP THYROID 30 MG PO TABS: 30.0000 mg | ORAL_TABLET | Freq: Every day | ORAL | 2 refills | Status: DC

## 2020-06-13 LAB — COMPREHENSIVE METABOLIC PANEL
AG Ratio: 1.8 (calc) (ref 1.0–2.5)
ALT: 13 U/L (ref 6–29)
AST: 14 U/L (ref 10–30)
Albumin: 4.4 g/dL (ref 3.6–5.1)
Alkaline phosphatase (APISO): 48 U/L (ref 31–125)
BUN: 15 mg/dL (ref 7–25)
CO2: 24 mmol/L (ref 20–32)
Calcium: 9.2 mg/dL (ref 8.6–10.2)
Chloride: 104 mmol/L (ref 98–110)
Creat: 0.79 mg/dL (ref 0.50–1.10)
Globulin: 2.5 g/dL (calc) (ref 1.9–3.7)
Glucose, Bld: 85 mg/dL (ref 65–99)
Potassium: 4.1 mmol/L (ref 3.5–5.3)
Sodium: 137 mmol/L (ref 135–146)
Total Bilirubin: 0.4 mg/dL (ref 0.2–1.2)
Total Protein: 6.9 g/dL (ref 6.1–8.1)

## 2020-06-13 LAB — CBC WITH DIFFERENTIAL/PLATELET
Absolute Monocytes: 509 cells/uL (ref 200–950)
Basophils Absolute: 20 cells/uL (ref 0–200)
Basophils Relative: 0.3 %
Eosinophils Absolute: 168 cells/uL (ref 15–500)
Eosinophils Relative: 2.5 %
HCT: 38.6 % (ref 35.0–45.0)
Hemoglobin: 13.1 g/dL (ref 11.7–15.5)
Lymphs Abs: 2559 cells/uL (ref 850–3900)
MCH: 28.7 pg (ref 27.0–33.0)
MCHC: 33.9 g/dL (ref 32.0–36.0)
MCV: 84.5 fL (ref 80.0–100.0)
MPV: 10.2 fL (ref 7.5–12.5)
Monocytes Relative: 7.6 %
Neutro Abs: 3444 cells/uL (ref 1500–7800)
Neutrophils Relative %: 51.4 %
Platelets: 245 10*3/uL (ref 140–400)
RBC: 4.57 10*6/uL (ref 3.80–5.10)
RDW: 12.9 % (ref 11.0–15.0)
Total Lymphocyte: 38.2 %
WBC: 6.7 10*3/uL (ref 3.8–10.8)

## 2020-06-13 LAB — LIPID PANEL
Cholesterol: 147 mg/dL (ref <200)
HDL: 55 mg/dL (ref >=50)
LDL Cholesterol (Calc): 73 mg/dL (calc)
Non-HDL Cholesterol (Calc): 92 mg/dL (calc) (ref <130)
Total CHOL/HDL Ratio: 2.7 (calc) (ref <5.0)
Triglycerides: 107 mg/dL (ref <150)

## 2020-06-13 LAB — QUANTIFERON-TB GOLD PLUS
Mitogen-NIL: 9.53 IU/mL
NIL: 0.04 IU/mL
QuantiFERON-TB Gold Plus: NEGATIVE
TB1-NIL: <0 IU/mL
TB2-NIL: <0 IU/mL

## 2020-06-13 LAB — HEPATITIS C ANTIBODY
Hepatitis C Ab: NONREACTIVE
SIGNAL TO CUT-OFF: 0 (ref <1.00)

## 2020-06-13 LAB — TSH: TSH: 1.71 mIU/L

## 2020-08-18 ENCOUNTER — Encounter: Payer: Self-pay | Admitting: Physician Assistant

## 2020-08-18 MED ORDER — MONTELUKAST SODIUM 10 MG PO TABS: 10.0000 mg | ORAL_TABLET | Freq: Every day | ORAL | 3 refills | Status: DC

## 2020-08-18 NOTE — Telephone Encounter (Signed)
Pt called in to check on this, she uses [facility] in [place]   Please advise

## 2020-10-03 ENCOUNTER — Encounter: Payer: Self-pay | Admitting: Physician Assistant

## 2020-10-03 ENCOUNTER — Telehealth (INDEPENDENT_AMBULATORY_CARE_PROVIDER_SITE_OTHER): Payer: BLUE CROSS/BLUE SHIELD | Admitting: Physician Assistant

## 2020-10-03 DIAGNOSIS — J069 Acute upper respiratory infection, unspecified: Secondary | ICD-10-CM | POA: Diagnosis not present

## 2020-10-03 NOTE — Progress Notes (Signed)
° °  Virtual Visit via Video   I connected with patient on 10/03/20 at  1:30 PM EST by a video enabled telemedicine application and verified that I am speaking with the correct person using two identifiers.  Location patient: Home Location provider: [place] [facility], Office Persons participating in the virtual visit: Patient, Provider, CMA (Patina Moore)  I discussed the limitations of evaluation and management by telemedicine and the availability of in person appointments. The patient expressed understanding and agreed to proceed.  Subjective:   HPI:   Patient presents via [facility] today c/o 3 days of URI symptoms initially presenting with sore throat, nasal congestion, sinus pressure. Endorses today with body aches. Denies fever or chills. Denies chest pain or SOB. Notes sinus pressure of maxillary sinuses. Denies ear pain or tooth pain. Notes sore throat is bilaterally and associated with swollen lymph nodes. Denies recent travel. Denies sick contact. Is taking Dayquil and Theraflu for symptom relief. Was COVID tested this morning (PCR). Awaiting results.   ROS:   See pertinent positives and negatives per HPI.  Patient Active Problem List   Diagnosis Date Noted   Gallstones 07/24/2019   Hypothyroidism 03/31/2019    Social History   Tobacco Use   Smoking status: Never Smoker   Smokeless tobacco: Never Used  Substance Use Topics   Alcohol use: Not Currently    Comment: socially    Current Outpatient Medications:    fexofenadine (ALLEGRA) 180 MG tablet, Take 180 mg by mouth daily., Disp: , Rfl:    fluticasone (FLONASE) 50 MCG/ACT nasal spray, Place 2 sprays into both nostrils daily as needed for allergies or rhinitis., Disp: , Rfl:    Ketotifen Fumarate (ZADITOR OP), Apply 0.035 % to eye., Disp: , Rfl:    montelukast (SINGULAIR) 10 MG tablet, Take 1 tablet (10 mg total) by mouth at bedtime., Disp: 30 tablet, Rfl: 3   Multiple Vitamin (MULTIVITAMIN WITH MINERALS)  TABS tablet, Take 1 tablet by mouth daily., Disp: , Rfl:    NP THYROID 30 MG tablet, Take 1 tablet (30 mg total) by mouth daily before breakfast., Disp: 90 tablet, Rfl: 2  Allergies  Allergen Reactions   Azithromycin     GI-- stomach cramps   Penicillins Hives    Did it involve swelling of the face/tongue/throat, SOB, or low BP? No Did it involve sudden or severe rash/hives, skin peeling, or any reaction on the inside of your mouth or nose? No Did you need to seek medical attention at a hospital or doctor's office? No When did it last happen?3-4 years ago If all above answers are NO, may proceed with cephalosporin use.     Objective:   There were no vitals taken for this visit.  Patient is well-developed, well-nourished in no acute distress.  Resting comfortably at home.  Head is normocephalic, atraumatic.  No labored breathing.  Speech is clear and coherent with logical content.  Patient is alert and oriented at baseline.   Assessment and Plan:   1. Viral URI Covid testing pending.  She is to quarantine until results are in.  Seems more likely a run-of-the-mill seasonal upper respiratory virus but want to make doubly sure.  Supportive measures and OTC medications reviewed.  Vitamin regimen discussed.  Will alter treatment regimen based on Covid results.  ER precautions discussed with patient.    Piedad Climes, PA-C 10/03/2020

## 2020-10-06 MED ORDER — DOXYCYCLINE HYCLATE 100 MG PO CAPS: 100.0000 mg | ORAL_CAPSULE | Freq: Two times a day (BID) | ORAL | 0 refills | Status: DC

## 2020-10-06 NOTE — Addendum Note (Signed)
Addended by: Waldon Merl on: 10/06/2020 11:26 AM   Modules accepted: Orders

## 2020-11-14 IMAGING — RF DG CHOLANGIOGRAM OPERATIVE
1 series · 2 of 2 positions shown · non-contrast
Comparison: Right upper quadrant abdominal ultrasound on 10/02/2019

CLINICAL DATA: Symptomatic cholelithiasis.

EXAM:
INTRAOPERATIVE CHOLANGIOGRAM
TECHNIQUE: Cholangiographic images from the C-arm fluoroscopic device were
submitted for interpretation post-operatively. Please see the
procedural report for the amount of contrast and the fluoroscopy
time utilized.

[Series 1: run · 2 of 2 slices shown]
[im 1/2]
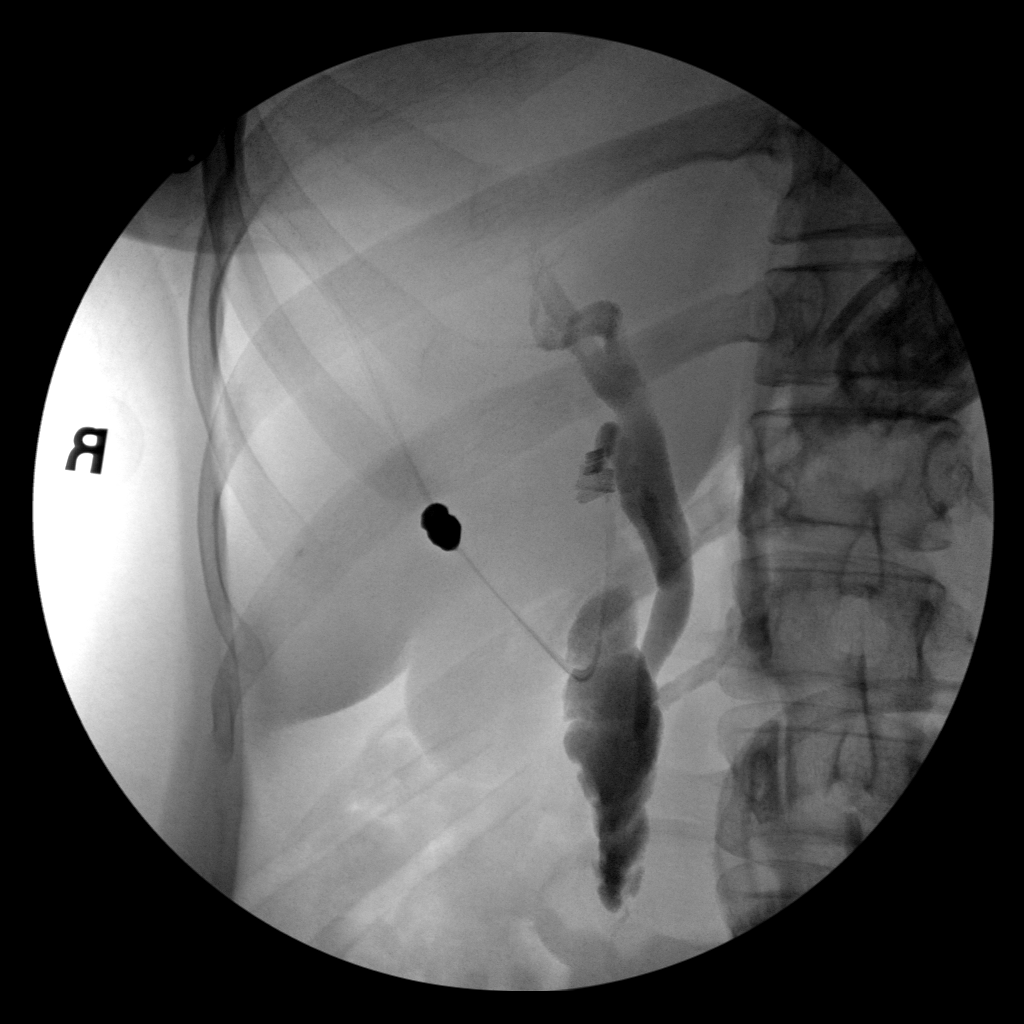
[im 2/2]
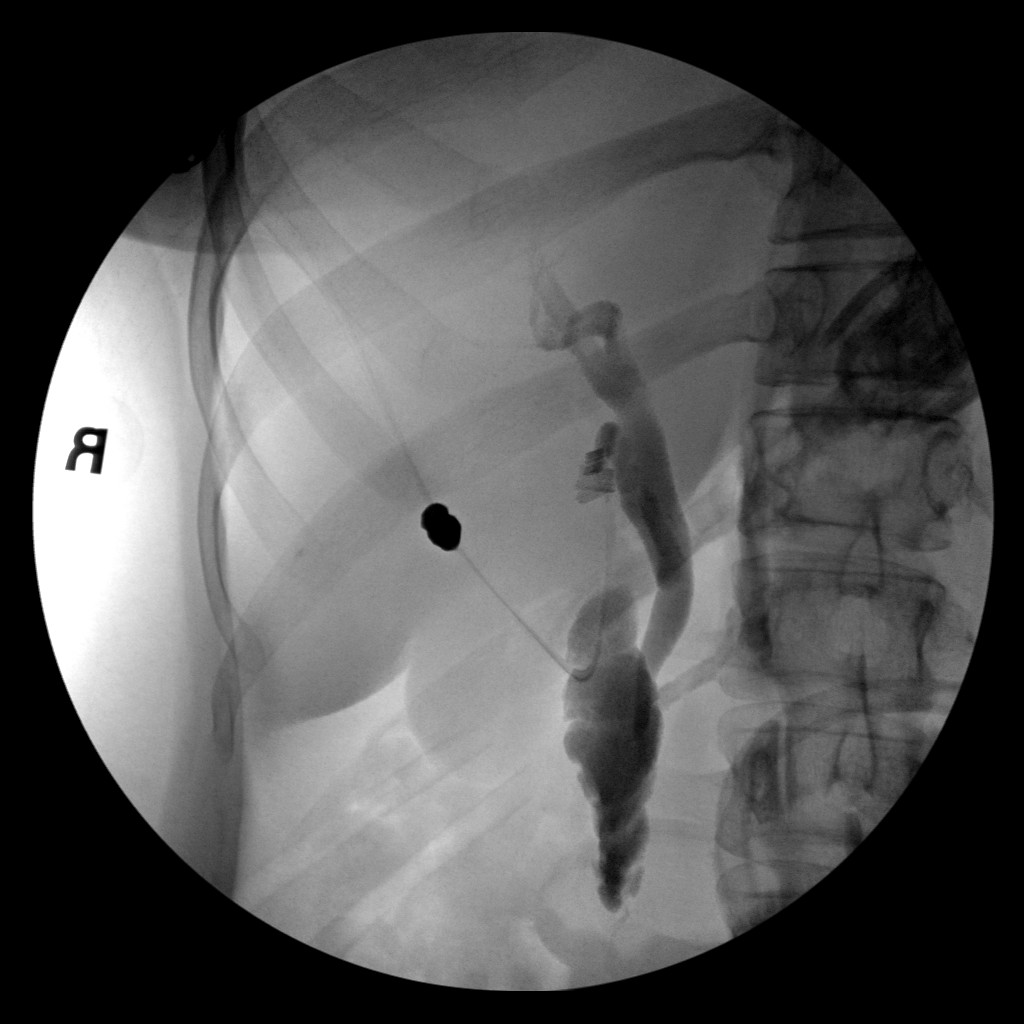

[2 of 2 positions shown; findings below may reference images not displayed]

FINDINGS: Single intraoperative image was submitted obtained with a C-arm.
Caliber of the common bile duct is mildly prominent without visible
filling defect. Distal common bile duct is somewhat limited in
visualization due to overlap with the duodenum which contains
contrast. No contrast extravasation.
IMPRESSION: Mild prominence in caliber of the common bile duct. No visualized
filling defect.

## 2020-11-25 IMAGING — CT CT ABD-PELV W/ CM
3 of 5 series · 14 of 36 positions shown, 17 images · IV contrast (OMNIPAQUE)
Comparison: Limited right upper quadrant abdomen ultrasound dated
05/07/2019

CLINICAL DATA: Right lower chest pain, midsternal chest pain and
right upper quadrant abdominal pain. Status post cholecystectomy on
01/25/2020.

EXAM:
CT CHEST, ABDOMEN, AND PELVIS WITH CONTRAST
TECHNIQUE: Multidetector CT imaging of the chest, abdomen and pelvis was
performed following the standard protocol during bolus
administration of intravenous contrast.
CONTRAST:  100mL OMNIPAQUE IOHEXOL 300 MG/ML  SOLN

[Series 2: cap with · axial · 0.69mm/px · z∈[-574,-54]mm · 9 of 130 slices shown, 12 images]
[im 13/130  mediastinal]
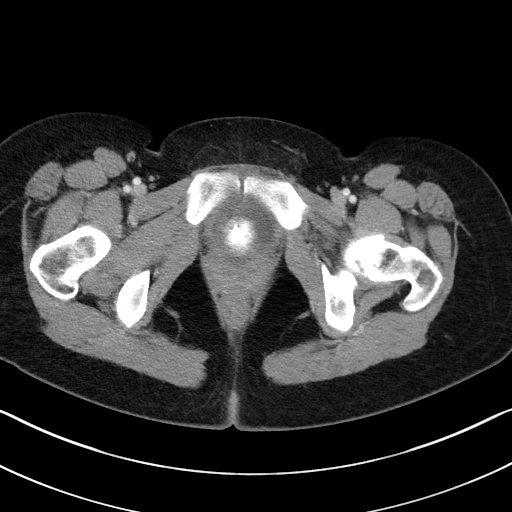
[im 13/130  lung]
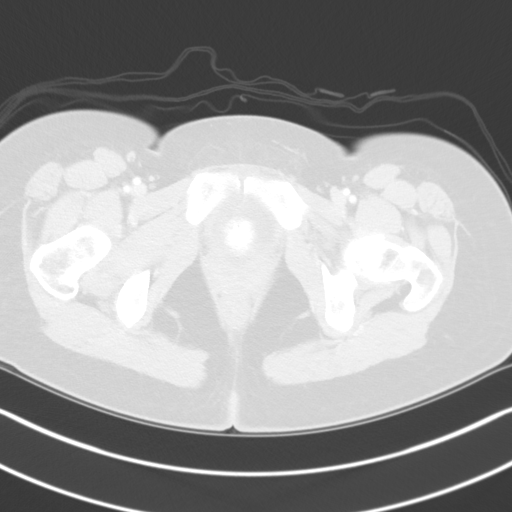
[im 26/130  lung]
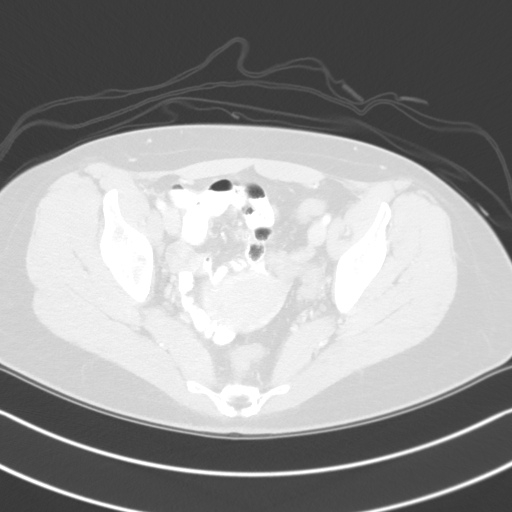
[im 39/130  lung]
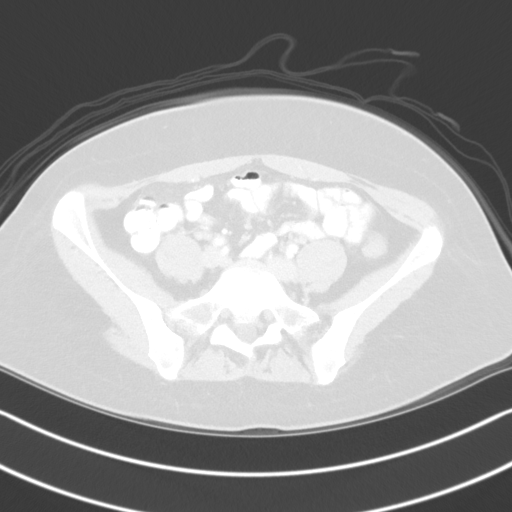
[im 52/130  lung]
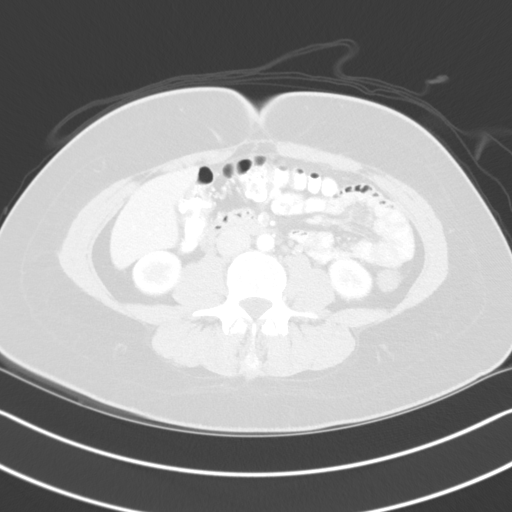
[im 65/130  mediastinal]
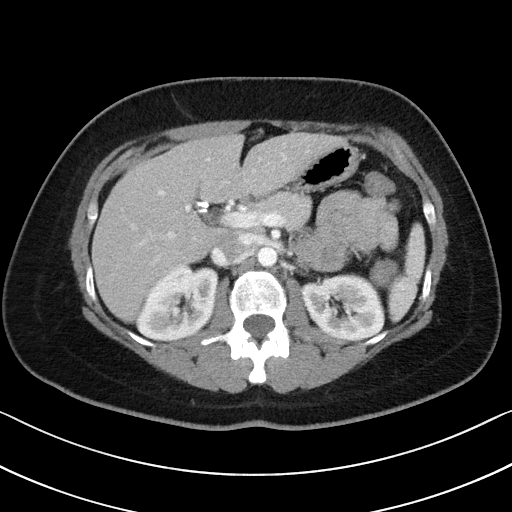
[im 65/130  lung]
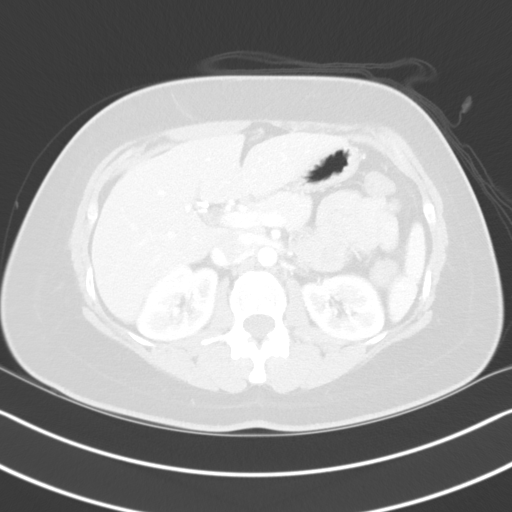
[im 78/130  lung]
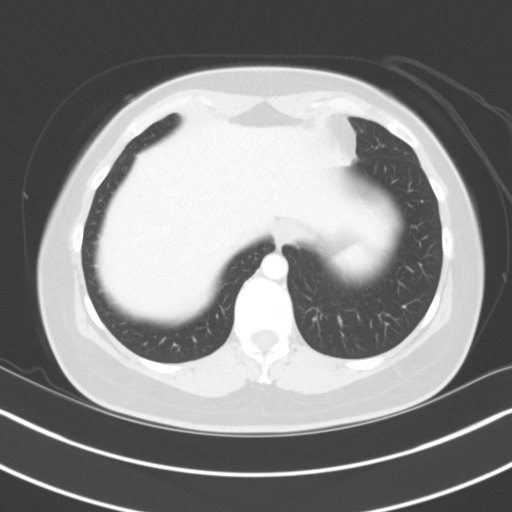
[im 91/130  lung]
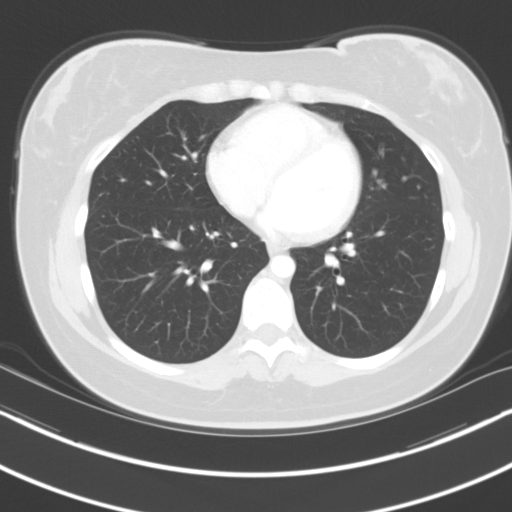
[im 104/130  lung]
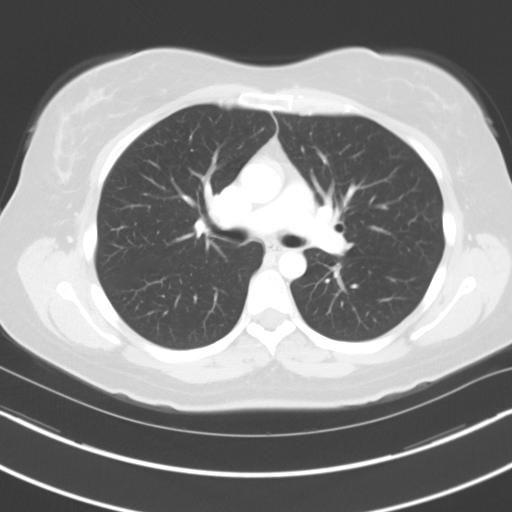
[im 117/130  mediastinal]
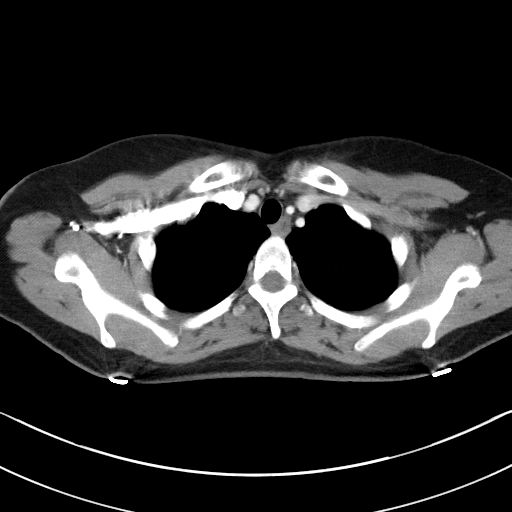
[im 117/130  lung]
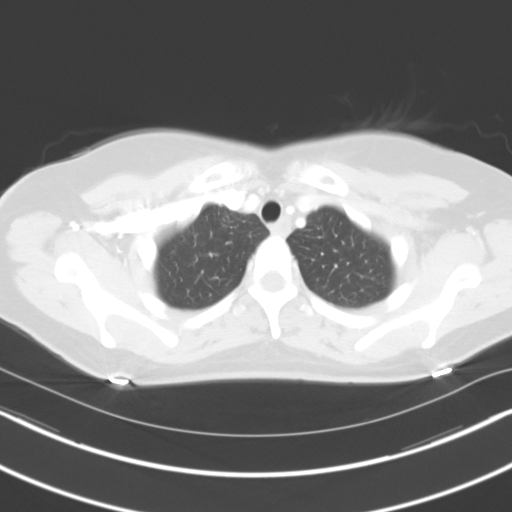

[Series 4: coronals · coronal · 0.82mm/px · 3 of 156 slices shown]
[im 32/156  lung]
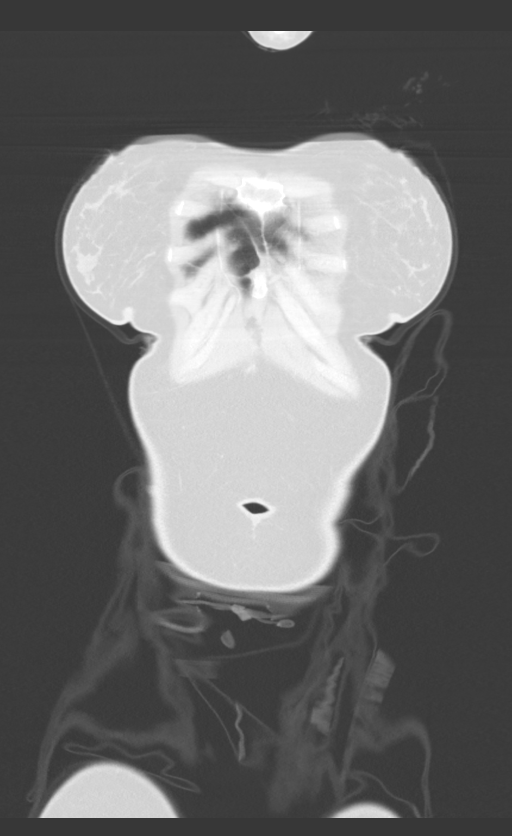
[im 63/156  lung]
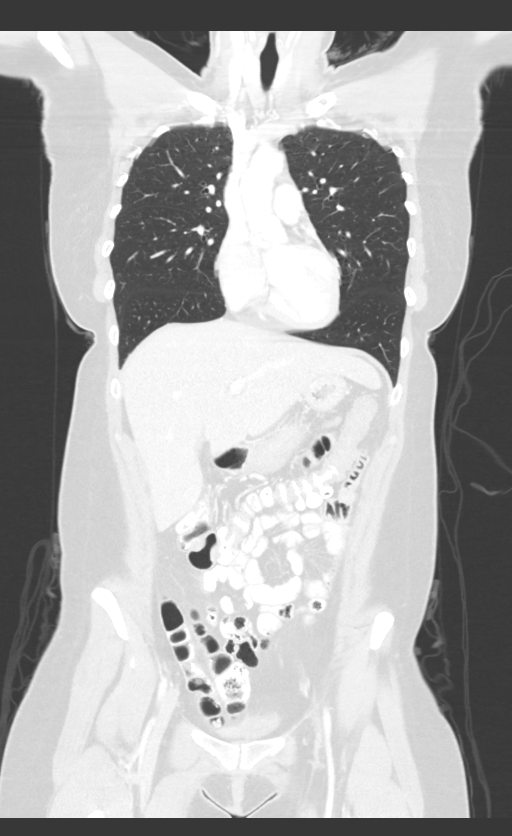
[im 94/156  lung]
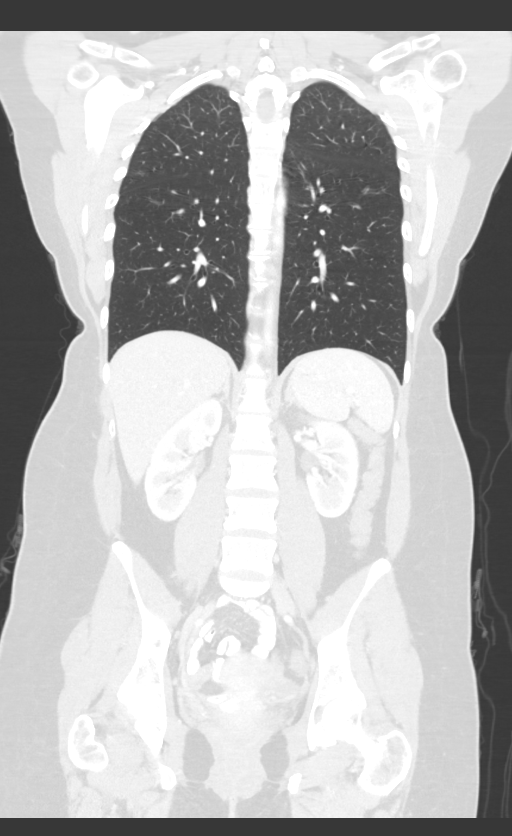

[Series 6: lung · axial · 0.69mm/px · z∈[-286,-238]mm · 2 of 161 slices shown]
[im 13/161  lung]
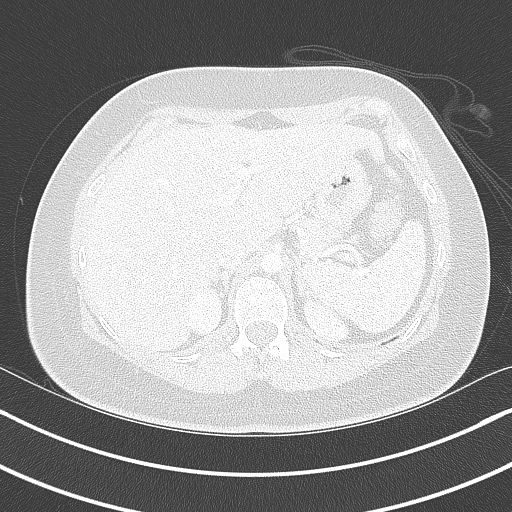
[im 37/161  lung]
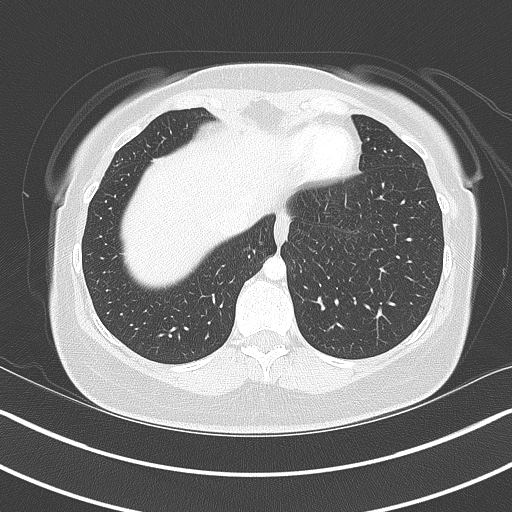

[14 of 36 positions shown; findings below may reference images not displayed]

FINDINGS: CT CHEST FINDINGS

Cardiovascular: No significant vascular findings. Normal heart size.
No pericardial effusion.

Mediastinum/Nodes: No enlarged mediastinal, hilar, or axillary lymph
nodes. Thyroid gland, trachea, and esophagus demonstrate no
significant findings.

Lungs/Pleura: Lungs are clear. No pleural effusion or pneumothorax.

Musculoskeletal: Mild thoracic spine degenerative changes.

CT ABDOMEN PELVIS FINDINGS

Hepatobiliary: Normal appearing liver. Cholecystectomy clips. No
fluid collection in the cholecystectomy bed.

Pancreas: Unremarkable. No pancreatic ductal dilatation or
surrounding inflammatory changes.

Spleen: Normal in size without focal abnormality.

Adrenals/Urinary Tract: Adrenal glands are unremarkable. Kidneys are
normal, without renal calculi, focal lesion, or hydronephrosis.
Bladder is unremarkable.

Stomach/Bowel: Stomach is within normal limits. Appendix appears
normal. No evidence of bowel wall thickening, distention, or
inflammatory changes.

Vascular/Lymphatic: No significant vascular findings are present. No
enlarged abdominal or pelvic lymph nodes.

Reproductive: Uterus and bilateral adnexa are unremarkable.

Other: Small amount of free peritoneal fluid in the pelvic
cul-de-sac, within normal limits of physiological fluid.

Musculoskeletal: Unremarkable bones.
IMPRESSION: Normal examination, status post cholecystectomy.

## 2021-01-06 ENCOUNTER — Other Ambulatory Visit: Payer: Self-pay

## 2021-01-06 DIAGNOSIS — E039 Hypothyroidism, unspecified: Secondary | ICD-10-CM

## 2021-01-06 MED ORDER — NP THYROID 30 MG PO TABS: 30.0000 mg | ORAL_TABLET | Freq: Every day | ORAL | 0 refills | Status: DC

## 2021-01-23 ENCOUNTER — Telehealth (INDEPENDENT_AMBULATORY_CARE_PROVIDER_SITE_OTHER): Payer: BLUE CROSS/BLUE SHIELD | Admitting: Family Medicine

## 2021-01-23 ENCOUNTER — Other Ambulatory Visit: Payer: Self-pay

## 2021-01-23 ENCOUNTER — Encounter: Payer: Self-pay | Admitting: Family Medicine

## 2021-01-23 DIAGNOSIS — H00014 Hordeolum externum left upper eyelid: Secondary | ICD-10-CM | POA: Diagnosis not present

## 2021-01-23 DIAGNOSIS — H1032 Unspecified acute conjunctivitis, left eye: Secondary | ICD-10-CM

## 2021-01-23 MED ORDER — POLYMYXIN B-TRIMETHOPRIM 10000-0.1 UNIT/ML-% OP SOLN: 2.0000 [drp] | Freq: Three times a day (TID) | OPHTHALMIC | 0 refills | Status: DC

## 2021-01-23 NOTE — Progress Notes (Signed)
I connected with  Sarah [name] on 01/23/21 by a video enabled telemedicine application and verified that I am speaking with the correct person using two identifiers.   I discussed the limitations of evaluation and management by telemedicine. The patient expressed understanding and agreed to proceed.

## 2021-01-23 NOTE — Progress Notes (Signed)
Virtual Visit via Video   I connected with patient on 01/23/21 at  9:30 AM EDT by a video enabled telemedicine application and verified that I am speaking with the correct person using two identifiers.  Location patient: Home Location provider: [place] [facility], [facility] Persons participating in the virtual visit: Patient, Provider, CMA (Sabrina M)  I discussed the limitations of evaluation and management by telemedicine and the availability of in person appointments. The patient expressed understanding and agreed to proceed.  Subjective:   HPI:   Red eyes- yesterday noticed some pain and itching of L eyelid.  She has hx of seasonal and eye allergies.  Eye began swelling.  Noticed a small red area just above upper lash line.  This AM, eye was swollen shut, crusted, oozing- yellow.  Eye is red and watery.  'it almost feels like it's pink eye'.  Eye feels itchy and gritty.  ROS:   See pertinent positives and negatives per HPI.  Patient Active Problem List   Diagnosis Date Noted  . Gallstones 07/24/2019  . Hypothyroidism 03/31/2019    Social History   Tobacco Use  . Smoking status: Never Smoker  . Smokeless tobacco: Never Used  Substance Use Topics  . Alcohol use: Not Currently    Comment: socially    Current Outpatient Medications:  .  fluticasone (FLONASE) 50 MCG/ACT nasal spray, Place 2 sprays into both nostrils daily as needed for allergies or rhinitis., Disp: , Rfl:  .  NP THYROID 30 MG tablet, Take 1 tablet (30 mg total) by mouth daily before breakfast., Disp: 90 tablet, Rfl: 0 .  doxycycline (VIBRAMYCIN) 100 MG capsule, Take 1 capsule (100 mg total) by mouth 2 (two) times daily. (Patient not taking: Reported on 01/23/2021), Disp: 20 capsule, Rfl: 0 .  fexofenadine (ALLEGRA) 180 MG tablet, Take 180 mg by mouth daily., Disp: , Rfl:  .  Ketotifen Fumarate (ZADITOR OP), Apply 0.035 % to eye. (Patient not taking: Reported on 01/23/2021), Disp: , Rfl:  .  montelukast  (SINGULAIR) 10 MG tablet, Take 1 tablet (10 mg total) by mouth at bedtime., Disp: 30 tablet, Rfl: 3 .  Multiple Vitamin (MULTIVITAMIN WITH MINERALS) TABS tablet, Take 1 tablet by mouth daily. (Patient not taking: Reported on 01/23/2021), Disp: , Rfl:   Allergies  Allergen Reactions  . Azithromycin     GI-- stomach cramps  . Penicillins Hives    Did it involve swelling of the face/tongue/throat, SOB, or low BP? No Did it involve sudden or severe rash/hives, skin peeling, or any reaction on the inside of your mouth or nose? No Did you need to seek medical attention at a hospital or doctor's office? No When did it last happen?3-4 years ago If all above answers are "NO", may proceed with cephalosporin use.     Objective:   There were no vitals taken for this visit. AAOx3, NAD NCAT, EOMI Small swollen area visible on L upper lash line consistent w/ a stye L conjunctival injxn No obvious CN deficits Coloring WNL Pt is able to speak clearly, coherently without shortness of breath or increased work of breathing.  Thought process is linear.  Mood is appropriate.   Assessment and Plan:   Conjunctivitis- new to provider, pt reports hx of similar.  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  Pt expressed understanding and is in agreement w/ plan.   Stye- new.  Reviewed dx and  supportive care- hot compresses, ibuprofen as needed for pain/swelling.  Pt expressed understanding and is in agreement w/ plan.    Neena Rhymes, MD 01/23/2021

## 2021-01-24 ENCOUNTER — Encounter: Payer: Self-pay | Admitting: Family Medicine

## 2021-01-24 ENCOUNTER — Telehealth: Payer: BLUE CROSS/BLUE SHIELD | Admitting: Family Medicine

## 2021-01-25 ENCOUNTER — Ambulatory Visit: Payer: BLUE CROSS/BLUE SHIELD | Admitting: Family Medicine

## 2021-01-25 ENCOUNTER — Encounter: Payer: Self-pay | Admitting: Family Medicine

## 2021-01-25 ENCOUNTER — Other Ambulatory Visit: Payer: Self-pay

## 2021-01-25 VITALS — BP 117/80 | HR 80 | Temp 99.4°F | Resp 20 | Ht 64.0 in | Wt 163.4 lb

## 2021-01-25 DIAGNOSIS — H00014 Hordeolum externum left upper eyelid: Secondary | ICD-10-CM | POA: Diagnosis not present

## 2021-01-25 DIAGNOSIS — H1032 Unspecified acute conjunctivitis, left eye: Secondary | ICD-10-CM | POA: Diagnosis not present

## 2021-01-25 MED ORDER — PREDNISONE 10 MG PO TABS: ORAL_TABLET | ORAL | 0 refills | Status: DC

## 2021-01-25 NOTE — Telephone Encounter (Signed)
Patient states that her eye is slightly improving ,but she thinks its too early to return to work because of the pain , oozing, and itching. Patient would like another note and states she will call for an office visit.

## 2021-01-25 NOTE — Progress Notes (Signed)
   Subjective:    Patient ID: Sarah [name], female    DOB: 02-03-1991, 30 y.o.   MRN: [id number]  HPI Stye- pt was seen 2 days ago and dx/ w/ stye of L upper eyelid.  Pt reports she is now getting 'spots' on her upper eyelid and now has 1 under the eye.  She reports eye is more swollen, more painful, more itchy.  Conjunctivitis- improving w/ Polytrim drops   Review of Systems For ROS see HPI   This visit occurred during the SARS-CoV-2 public health emergency.  Safety protocols were in place, including screening questions prior to the visit, additional usage of staff PPE, and extensive cleaning of exam room while observing appropriate contact time as indicated for disinfecting solutions.       Objective:   Physical Exam Vitals reviewed.  Constitutional:      General: She is not in acute distress.    Appearance: Normal appearance. She is not diaphoretic.  HENT:     Head: Normocephalic and atraumatic.  Eyes:     General:        Right eye: No discharge.        Left eye: No discharge.     Extraocular Movements: Extraocular movements intact.     Pupils: Pupils are equal, round, and reactive to light.     Comments: Swelling of L upper lid, mild erythema.  Not warm to touch.  Mild TTP.  No palpable stye.  Tiny blood blister like lesions along the lash line  Neurological:     General: No focal deficit present.     Mental Status: She is alert and oriented to person, place, and time.  Psychiatric:        Mood and Affect: Mood normal.        Behavior: Behavior normal.        Thought Content: Thought content normal.           Assessment & Plan:  Stye- slightly more swollen than 2 days ago.  Discussed that these often get worse before they get better.  No fluctuance or concern for infection at this time.  Suspect this is all allergy driven and will start Prednisone taper to help with all the allergy inflammation and congestion.  Reviewed supportive care and red flags that should  prompt return.  Pt expressed understanding and is in agreement w/ plan.   Conjunctivitis- improving.  Finish drops as directed.

## 2021-01-25 NOTE — Patient Instructions (Signed)
Follow up as needed or as scheduled START the prednisone as directed- take w/ food Cold compresses to the eye Finish the eye drops as directed- no more than 7 days Call with any questions or concerns Hang in there!!!

## 2021-02-14 ENCOUNTER — Encounter: Payer: Self-pay | Admitting: Family Medicine

## 2021-02-17 NOTE — Telephone Encounter (Signed)
Pt called in asking if you would be her pcp since Selena Batten has moved on.   Please advise, I did schedule her an appt with you for next week.

## 2021-02-20 NOTE — Telephone Encounter (Signed)
LM making pt aware of this, I have asked her to call back to schedule a TOC appt with KT.Marland KitchenELEA

## 2021-02-22 ENCOUNTER — Encounter: Payer: Self-pay | Admitting: Family Medicine

## 2021-02-22 ENCOUNTER — Telehealth (INDEPENDENT_AMBULATORY_CARE_PROVIDER_SITE_OTHER): Payer: BLUE CROSS/BLUE SHIELD | Admitting: Family Medicine

## 2021-02-22 DIAGNOSIS — F419 Anxiety disorder, unspecified: Secondary | ICD-10-CM | POA: Diagnosis not present

## 2021-02-22 DIAGNOSIS — F32A Depression, unspecified: Secondary | ICD-10-CM | POA: Diagnosis not present

## 2021-02-22 MED ORDER — BUPROPION HCL 75 MG PO TABS: 75.0000 mg | ORAL_TABLET | Freq: Two times a day (BID) | ORAL | 3 refills | Status: DC

## 2021-02-22 NOTE — Progress Notes (Signed)
Virtual Visit via Video   I connected with patient on 02/22/21 at  1:30 PM EDT by a video enabled telemedicine application and verified that I am speaking with the correct person using two identifiers.  Location patient: Home Location provider: [place] [facility], [facility] Persons participating in the virtual visit: Patient, Provider, CMA (Sabrina M)  I discussed the limitations of evaluation and management by telemedicine and the availability of in person appointments. The patient expressed understanding and agreed to proceed.  Subjective:   HPI:   Anxiety/Depression- PHQ9= 16 today.  Pt reports she has previously had anxiety issues and was on Celexa.  'it didn't really do anything for me' and caused fatigue, nausea.  Then tried hydroxyzine PRN.  This was excessively sedating for her.  Had some Contrave around the house that she found and took the last few days w/ some relief.    ROS:   See pertinent positives and negatives per HPI.  Patient Active Problem List   Diagnosis Date Noted  . Gallstones 07/24/2019  . Hypothyroidism 03/31/2019    Social History   Tobacco Use  . Smoking status: Never Smoker  . Smokeless tobacco: Never Used  Substance Use Topics  . Alcohol use: Not Currently    Comment: socially    Current Outpatient Medications:  .  fexofenadine (ALLEGRA) 180 MG tablet, Take 180 mg by mouth daily., Disp: , Rfl:  .  fluticasone (FLONASE) 50 MCG/ACT nasal spray, Place 2 sprays into both nostrils daily as needed for allergies or rhinitis., Disp: , Rfl:  .  NP THYROID 30 MG tablet, Take 1 tablet (30 mg total) by mouth daily before breakfast., Disp: 90 tablet, Rfl: 0 .  trimethoprim-polymyxin b (POLYTRIM) ophthalmic solution, Place 2 drops into the left eye 3 (three) times daily. Use no longer than 7 days, Disp: 10 mL, Rfl: 0 .  doxycycline (VIBRAMYCIN) 100 MG capsule, Take 1 capsule (100 mg total) by mouth 2 (two) times daily. (Patient not taking: No sig reported),  Disp: 20 capsule, Rfl: 0 .  Ketotifen Fumarate (ZADITOR OP), Apply 0.035 % to eye. (Patient not taking: No sig reported), Disp: , Rfl:  .  montelukast (SINGULAIR) 10 MG tablet, Take 1 tablet (10 mg total) by mouth at bedtime. (Patient not taking: No sig reported), Disp: 30 tablet, Rfl: 3 .  Multiple Vitamin (MULTIVITAMIN WITH MINERALS) TABS tablet, Take 1 tablet by mouth daily. (Patient not taking: No sig reported), Disp: , Rfl:  .  predniSONE (DELTASONE) 10 MG tablet, 3 tabs x3 days and then 2 tabs x3 days and then 1 tab x3 days.  Take w/ food. (Patient not taking: Reported on 02/22/2021), Disp: 18 tablet, Rfl: 0  Allergies  Allergen Reactions  . Azithromycin     GI-- stomach cramps  . Penicillins Hives    Did it involve swelling of the face/tongue/throat, SOB, or low BP? No Did it involve sudden or severe rash/hives, skin peeling, or any reaction on the inside of your mouth or nose? No Did you need to seek medical attention at a hospital or doctor's office? No When did it last happen?3-4 years ago If all above answers are "NO", may proceed with cephalosporin use.     Objective:   There were no vitals taken for this visit. AAOx3, NAD NCAT, EOMI No obvious CN deficits Coloring WNL Pt is able to speak clearly, coherently without shortness of breath or increased work of breathing.  Thought process is linear.  Mood is appropriate.  Assessment and Plan:   Anxiety/Depression- new to provider, recurrent issue for pt but recently worsened.  PHQ9=16.  Did not do well on Celexa previously due to side effects and lack of improvement.  Feels that the leftover Contrave she has taken the last few days has helped.  Will stop the Contrave and start twice daily Wellbutrin.  Pt expressed understanding and is in agreement w/ plan.   Neena Rhymes, MD 02/22/2021

## 2021-02-22 NOTE — Progress Notes (Signed)
I connected with  Sarah [name] on 02/22/21 by a video enabled telemedicine application and verified that I am speaking with the correct person using two identifiers.   I discussed the limitations of evaluation and management by telemedicine. The patient expressed understanding and agreed to proceed.

## 2021-02-27 ENCOUNTER — Encounter: Payer: Self-pay | Admitting: Family Medicine

## 2021-04-04 ENCOUNTER — Other Ambulatory Visit: Payer: Self-pay | Admitting: Family Medicine

## 2021-04-04 DIAGNOSIS — E039 Hypothyroidism, unspecified: Secondary | ICD-10-CM

## 2021-04-19 ENCOUNTER — Encounter: Payer: Self-pay | Admitting: *Deleted

## 2021-06-01 ENCOUNTER — Encounter: Payer: Self-pay | Admitting: Family Medicine

## 2021-07-14 ENCOUNTER — Encounter: Payer: Self-pay | Admitting: Family Medicine

## 2021-07-14 ENCOUNTER — Telehealth: Payer: Self-pay | Admitting: Nurse Practitioner

## 2021-07-14 ENCOUNTER — Other Ambulatory Visit: Payer: Self-pay | Admitting: Family Medicine

## 2021-07-14 DIAGNOSIS — E039 Hypothyroidism, unspecified: Secondary | ICD-10-CM

## 2021-07-14 DIAGNOSIS — J0100 Acute maxillary sinusitis, unspecified: Secondary | ICD-10-CM

## 2021-07-14 DIAGNOSIS — J029 Acute pharyngitis, unspecified: Secondary | ICD-10-CM

## 2021-07-14 MED ORDER — THYROID 30 MG PO TABS: 30.0000 mg | ORAL_TABLET | Freq: Every day | ORAL | 0 refills | Status: DC

## 2021-07-14 MED ORDER — DOXYCYCLINE HYCLATE 100 MG PO TABS: 100.0000 mg | ORAL_TABLET | Freq: Two times a day (BID) | ORAL | 0 refills | Status: DC

## 2021-07-14 NOTE — Progress Notes (Signed)

## 2021-07-14 NOTE — Telephone Encounter (Signed)
Patient returned Sabrina's call - wants Martie Lee to call her back.

## 2021-07-17 ENCOUNTER — Other Ambulatory Visit: Payer: Self-pay

## 2021-07-17 ENCOUNTER — Encounter: Payer: Self-pay | Admitting: Family Medicine

## 2021-07-17 ENCOUNTER — Telehealth (INDEPENDENT_AMBULATORY_CARE_PROVIDER_SITE_OTHER): Payer: Self-pay | Admitting: Family Medicine

## 2021-07-17 VITALS — Ht 64.0 in | Wt 160.0 lb

## 2021-07-17 DIAGNOSIS — J019 Acute sinusitis, unspecified: Secondary | ICD-10-CM

## 2021-07-17 DIAGNOSIS — R300 Dysuria: Secondary | ICD-10-CM

## 2021-07-17 NOTE — Progress Notes (Signed)
Virtual Visit via Video   I connected with patient on 07/17/21 at  9:30 AM EDT by a video enabled telemedicine application and verified that I am speaking with the correct person using two identifiers.  Location patient: Home Location provider: [place] [facility], Office Persons participating in the virtual visit: Patient, Provider, CMA Tresa Endo C)  I discussed the limitations of evaluation and management by telemedicine and the availability of in person appointments. The patient expressed understanding and agreed to proceed.  Subjective:   HPI:   URI- pt reports 'sinus problems' x2 weeks.  Bilateral ear pain/pressure.  Did e-visit and was prescribed Doxy.  Pt reports pain, burning, urgency w/ urination sxs started in the last 2 days but worsened overnight.  Pt has increased fluid intake over the last few days but admits to poor water intake regularly.  + COVID exposure- husband, both MIL/FIL are positive.  Has tested negative multiple times.  Taking ibuprofen w/ some relief.  ROS:   See pertinent positives and negatives per HPI.  Patient Active Problem List   Diagnosis Date Noted   Gallstones 07/24/2019   Hypothyroidism 03/31/2019    Social History   Tobacco Use   Smoking status: Never   Smokeless tobacco: Never  Substance Use Topics   Alcohol use: Not Currently    Comment: socially    Current Outpatient Medications:    doxycycline (VIBRA-TABS) 100 MG tablet, Take 1 tablet (100 mg total) by mouth 2 (two) times daily. 1 po bid, Disp: 20 tablet, Rfl: 0   fexofenadine (ALLEGRA) 180 MG tablet, Take 180 mg by mouth daily., Disp: , Rfl:    fluticasone (FLONASE) 50 MCG/ACT nasal spray, Place 2 sprays into both nostrils daily as needed for allergies or rhinitis., Disp: , Rfl:    thyroid (NP THYROID) 30 MG tablet, Take 1 tablet (30 mg total) by mouth daily before breakfast., Disp: 90 tablet, Rfl: 0   trimethoprim-polymyxin b (POLYTRIM) ophthalmic solution, Place 2 drops into the  left eye 3 (three) times daily. Use no longer than 7 days, Disp: 10 mL, Rfl: 0   Ketotifen Fumarate (ZADITOR OP), Apply 0.035 % to eye. (Patient not taking: No sig reported), Disp: , Rfl:    montelukast (SINGULAIR) 10 MG tablet, Take 1 tablet (10 mg total) by mouth at bedtime. (Patient not taking: No sig reported), Disp: 30 tablet, Rfl: 3   Multiple Vitamin (MULTIVITAMIN WITH MINERALS) TABS tablet, Take 1 tablet by mouth daily. (Patient not taking: No sig reported), Disp: , Rfl:    predniSONE (DELTASONE) 10 MG tablet, 3 tabs x3 days and then 2 tabs x3 days and then 1 tab x3 days.  Take w/ food. (Patient not taking: No sig reported), Disp: 18 tablet, Rfl: 0  Allergies  Allergen Reactions   Azithromycin     GI-- stomach cramps   Penicillins Hives    Did it involve swelling of the face/tongue/throat, SOB, or low BP? No Did it involve sudden or severe rash/hives, skin peeling, or any reaction on the inside of your mouth or nose? No Did you need to seek medical attention at a hospital or doctor's office? No When did it last happen?    3-4 years ago   If all above answers are "NO", may proceed with cephalosporin use.     Objective:   Ht 5\' 4"  (1.626 m)   Wt 160 lb (72.6 kg) Comment: pt reported  BMI 27.46 kg/m  AAOx3, NAD NCAT, EOMI No obvious CN deficits Coloring WNL  Pt is able to speak clearly, coherently without shortness of breath or increased work of breathing.  Thought process is linear.  Mood is appropriate.   Assessment and Plan:   Sinusitis- new.  Pt did an e-visit 2 days ago and was prescribed Doxy.  She has multiple close COVID contacts but keeps testing negative on home tests.  Discussed that if her sinus sxs are viral or allergy related, the Doxy will not help.  Encouraged increased fluids, alternating tylenol/ibuprofen for pain relief.  She is to notify me if things change or fail to improve.  Pt expressed understanding and is in agreement w/ plan.   Dysuria- new.  Pt is  now having UTI sxs.  Doxy is effective at treating most UTIs but if her sxs aren't improved by Wednesday, she will need to come give a urine so we can send for culture and adjust abx accordingly.  Encouraged increased fluids and AZO as needed.  Pt expressed understanding and is in agreement w/ plan.    Neena Rhymes, MD 07/17/2021

## 2021-07-17 NOTE — Progress Notes (Signed)
I connected with  Sarah [name] on 07/17/21 by a video enabled telemedicine application and verified that I am speaking with the correct person using two identifiers.   I discussed the limitations of evaluation and management by telemedicine. The patient expressed understanding and agreed to proceed.

## 2021-10-04 ENCOUNTER — Telehealth (INDEPENDENT_AMBULATORY_CARE_PROVIDER_SITE_OTHER): Payer: Self-pay | Admitting: Family Medicine

## 2021-10-04 DIAGNOSIS — J069 Acute upper respiratory infection, unspecified: Secondary | ICD-10-CM

## 2021-10-04 DIAGNOSIS — R051 Acute cough: Secondary | ICD-10-CM

## 2021-10-04 MED ORDER — BENZONATATE 100 MG PO CAPS: 100.0000 mg | ORAL_CAPSULE | Freq: Three times a day (TID) | ORAL | 0 refills | Status: DC | PRN

## 2021-10-04 NOTE — Patient Instructions (Signed)
I am sorry to hear that you are sick.  Fortunately it appears to be a viral infection at this point.  As we discussed multiple viruses are going around right now including RSV, flu, COVID.  Less likely influenza without fever. I recommend continued symptomatic care with fluids and rest.  You can take Tylenol as needed for fever or body aches, Mucinex as needed for cough/congestion and I did send in some Tessalon Perles if needed for cough.  I am glad to hear the COVID test was negative this morning but would recommend repeating that tomorrow  or tomorrow night to make sure.  Okay to return to work on Friday if you have not had a fever for 24 hours off of fever reducing medicine and repeat COVID testing is negative.  Please let us know if you have questions and hope you feel better soon!

## 2021-10-04 NOTE — Progress Notes (Signed)
Virtual Visit via Video Note  I connected with Sarah [name] on 10/04/21 at 1:20 PM by a video enabled telemedicine application and verified that I am speaking with the correct person using two identifiers.  Patient location: home  - by self My location: office - [place]   I discussed the limitations, risks, security and privacy concerns of performing an evaluation and management service by telephone and the availability of in person appointments. I also discussed with the patient that there may be a patient responsible charge related to this service. The patient expressed understanding and agreed to proceed, consent obtained  Chief complaint: Chief Complaint  Patient presents with   Cough    Pt reports sxs starting Monday afternoon, sore throat, sinus congestion, sinus drainage, cough     History of Present Illness: Sarah [name] is a 30 y.o. female  Cough: Started 2 days ago - late afternoon, scratchy throat, sinus pressure. Worse sore throat overnight and more postnasal drip. Some increased cough yesterday - moving toward chest.  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  Some flu at school, other infections in classroom.  At home covid test this morning negative.  Has not had flu vaccine this year.  No fever. No significant bodyache, headache.  Eating and drinking ok. No dyspnea.  Tx: dayquil, alka seltzer.  No prior covid infection or vaccination.    Treated with doxycycline for sinus infection in September. PCN, azithro allergy.   Patient Active Problem List   Diagnosis Date Noted   Gallstones 07/24/2019   Hypothyroidism 03/31/2019   Past Medical History:  Diagnosis Date   Anxiety    Bell's palsy    Gall stones    Gestational hypertension 10/24/2019   Headache    migraines   Hypothyroidism    Ovarian cyst    Thyroid disease    Past Surgical History:  Procedure Laterality Date   CHOLECYSTECTOMY N/A 01/25/2020   Procedure: LAPAROSCOPIC CHOLECYSTECTOMY WITH  INTRAOPERATIVE CHOLANGIOGRAM;  Surgeon: Griselda Miner, MD;  Location: [facility] OR;  Service: General;  Laterality: N/A;   LASIK  2017   NO PAST SURGERIES     wisdom tooth removal     Allergies  Allergen Reactions   Azithromycin     GI-- stomach cramps   Penicillins Hives    Did it involve swelling of the face/tongue/throat, SOB, or low BP? No Did it involve sudden or severe rash/hives, skin peeling, or any reaction on the inside of your mouth or nose? No Did you need to seek medical attention at a hospital or doctor's office? No When did it last happen?    3-4 years ago   If all above answers are NO, may proceed with cephalosporin use.    Prior to Admission medications   Medication Sig Start Date End Date Taking? Authorizing Provider  doxycycline (VIBRA-TABS) 100 MG tablet Take 1 tablet (100 mg total) by mouth 2 (two) times daily. 1 po bid 07/14/21  Yes Martin, Mary-Margaret, FNP  fexofenadine (ALLEGRA) 180 MG tablet Take 180 mg by mouth daily.   Yes [provider]  fluticasone (FLONASE) 50 MCG/ACT nasal spray Place 2 sprays into both nostrils daily as needed for allergies or rhinitis.   Yes [provider]  thyroid (NP THYROID) 30 MG tablet Take 1 tablet (30 mg total) by mouth daily before breakfast. 07/14/21  Yes Sheliah Hatch, MD  trimethoprim-polymyxin b (POLYTRIM) ophthalmic solution Place 2 drops into the left eye 3 (three) times daily. Use no longer than  7 days 01/23/21  Yes Sheliah Hatch, MD   Social History   Socioeconomic History   Marital status: Married    Spouse name: Not on file   Number of children: Not on file   Years of education: Not on file   Highest education level: Not on file  Occupational History   Not on file  Tobacco Use   Smoking status: Never   Smokeless tobacco: Never  Vaping Use   Vaping Use: Never used  Substance and Sexual Activity   Alcohol use: Not Currently    Comment: socially   Drug use: Never   Sexual activity:  Yes    Birth control/protection: None  Other Topics Concern   Not on file  Social History Narrative   Not on file   Social Determinants of Health   Financial Resource Strain: Not on file  Food Insecurity: Not on file  Transportation Needs: Not on file  Physical Activity: Not on file  Stress: Not on file  Social Connections: Not on file  Intimate Partner Violence: Not on file    Observations/Objective: There were no vitals filed for this visit. Nontoxic appearance on video.  Speaking in full sentences, no respiratory distress.  Coherent responses.  Euthymic mood.  All questions were answered with understanding of plan expressed.  Assessment and Plan: Acute cough - Plan: benzonatate (TESSALON) 100 MG capsule  Viral upper respiratory tract infection Likely viral infection including possible RSV, flu, COVID but no fever, reassuring exam on video.  No red flags on history.  Reassuring negative COVID test this morning but recommended repeat tomorrow.  Symptomatic care with Mucinex, fluids, rest, other cough/cold meds as above are okay.  Tylenol if needed for body ache or fever.  Tessalon Perles prescribed as needed for cough.  Out of work today, tomorrow, return Friday if fever free tomorrow off antipyretics and repeat COVID testing negative.  RTC precautions given.  Follow Up Instructions: As needed   I discussed the assessment and treatment plan with the patient. The patient was provided an opportunity to ask questions and all were answered. The patient agreed with the plan and demonstrated an understanding of the instructions.   The patient was advised to call back or seek an in-person evaluation if the symptoms worsen or if the condition fails to improve as anticipated.  Shade Flood, MD

## 2021-10-05 ENCOUNTER — Encounter: Payer: Self-pay | Admitting: Family Medicine

## 2021-10-06 NOTE — Telephone Encounter (Signed)
Pt states that she is losing her voice, her cough has increased and she states that she is very run down. Wanted to know what to do about this. Please advise

## 2021-10-06 NOTE — Telephone Encounter (Signed)
We don't have anything in our office this afternoon, and I believe Alcario Drought stated everywhere else was full as well. Any advice?

## 2021-10-06 NOTE — Telephone Encounter (Signed)
If she's feeling worse, would recommend in person assessment at an Urgent Care. Thanks,  Luan Pulling

## 2021-10-06 NOTE — Telephone Encounter (Signed)
Called patient and let her know that we didn't have any openings and the other locations were full as well. She stated she would call around to some urgent cares

## 2021-10-11 ENCOUNTER — Other Ambulatory Visit: Payer: Self-pay | Admitting: Family Medicine

## 2021-10-11 DIAGNOSIS — E039 Hypothyroidism, unspecified: Secondary | ICD-10-CM

## 2021-10-17 ENCOUNTER — Encounter: Payer: Self-pay | Admitting: Family Medicine

## 2021-10-17 DIAGNOSIS — R051 Acute cough: Secondary | ICD-10-CM

## 2021-10-17 DIAGNOSIS — J019 Acute sinusitis, unspecified: Secondary | ICD-10-CM

## 2021-10-19 MED ORDER — DOXYCYCLINE HYCLATE 100 MG PO TABS: 100.0000 mg | ORAL_TABLET | Freq: Two times a day (BID) | ORAL | 0 refills | Status: DC

## 2021-10-23 ENCOUNTER — Encounter: Payer: Self-pay | Admitting: Family Medicine

## 2021-11-02 ENCOUNTER — Ambulatory Visit: Payer: Self-pay | Admitting: Family Medicine

## 2021-11-09 ENCOUNTER — Encounter: Payer: Self-pay | Admitting: Family Medicine

## 2021-11-09 ENCOUNTER — Ambulatory Visit: Payer: Self-pay | Admitting: Family Medicine

## 2021-11-09 ENCOUNTER — Ambulatory Visit: Payer: BC Managed Care – PPO | Admitting: Family Medicine

## 2021-11-09 DIAGNOSIS — E039 Hypothyroidism, unspecified: Secondary | ICD-10-CM

## 2021-11-09 DIAGNOSIS — F419 Anxiety disorder, unspecified: Secondary | ICD-10-CM | POA: Diagnosis not present

## 2021-11-09 DIAGNOSIS — F32A Depression, unspecified: Secondary | ICD-10-CM | POA: Diagnosis not present

## 2021-11-09 LAB — T4, FREE: Free T4: 0.77 ng/dL (ref 0.60–1.60)

## 2021-11-09 LAB — TSH: TSH: 1.79 u[IU]/mL (ref 0.35–5.50)

## 2021-11-09 LAB — T3, FREE: T3, Free: 3.6 pg/mL (ref 2.3–4.2)

## 2021-11-09 MED ORDER — THYROID 30 MG PO TABS: 30.0000 mg | ORAL_TABLET | Freq: Every day | ORAL | 1 refills | Status: DC

## 2021-11-09 MED ORDER — FLUOXETINE HCL 10 MG PO CAPS: 10.0000 mg | ORAL_CAPSULE | Freq: Every day | ORAL | 3 refills | Status: DC

## 2021-11-09 NOTE — Progress Notes (Signed)
° °  Subjective:    Patient ID: Sarah [name], female    DOB: June 10, 1991, 31 y.o.   MRN: [id number]  HPI Anxiety- pt was prescribed Contrave from another doctor but this seemed to make anxiety worse.  Tried Wellbutrin but pt felt it made her blood pressure spike and she did not tolerate it.  Now feels that anxiety is worse.  Just put daughter in childcare for 1st time.  No specific trigger for anxiety.  Sxs will build to the point she feels she may have a panic attack but has not had one.    Hypothyroid- ongoing issue for pt.  Currently on NP Thyroid 30mg .  Has not had labs in ~18 months.   Review of Systems For ROS see HPI   This visit occurred during the SARS-CoV-2 public health emergency.  Safety protocols were in place, including screening questions prior to the visit, additional usage of staff PPE, and extensive cleaning of exam room while observing appropriate contact time as indicated for disinfecting solutions.      Objective:   Physical Exam Vitals reviewed.  Constitutional:      General: She is not in acute distress.    Appearance: Normal appearance. She is not ill-appearing.  HENT:     Head: Normocephalic and atraumatic.  Eyes:     Extraocular Movements: Extraocular movements intact.     Conjunctiva/sclera: Conjunctivae normal.     Pupils: Pupils are equal, round, and reactive to light.  Skin:    General: Skin is warm and dry.  Neurological:     General: No focal deficit present.     Mental Status: She is alert and oriented to person, place, and time.  Psychiatric:        Mood and Affect: Mood normal.        Behavior: Behavior normal.        Thought Content: Thought content normal.          Assessment & Plan:

## 2021-11-09 NOTE — Patient Instructions (Signed)
Follow up in 3-4 weeks to recheck mood We'll notify you of your lab results and make any changes if needed START the Fluoxetine once daily Call with any questions or concerns Hang In There!!  You've Got This!!!

## 2021-11-12 NOTE — Assessment & Plan Note (Signed)
Chronic problem.  Pt is overdue for labs.   Given her increased anxiety, will check labs to determine if medication dose is appropriate.

## 2021-11-12 NOTE — Assessment & Plan Note (Signed)
Deteriorated.  Pt reports increased anxiety, some depression.  Feels sxs are building to near panic levels but thus far has been able to talk herself down.  Just put her daughter in daycare for the first time and this has been very stressful.  Discussed treatment options and together decided that low dose Prozac would be best.  Will follow closely and adjust meds prn.

## 2021-11-16 ENCOUNTER — Encounter: Payer: Self-pay | Admitting: Family Medicine

## 2021-11-16 ENCOUNTER — Telehealth (INDEPENDENT_AMBULATORY_CARE_PROVIDER_SITE_OTHER): Payer: BC Managed Care – PPO | Admitting: Family Medicine

## 2021-11-16 DIAGNOSIS — R0981 Nasal congestion: Secondary | ICD-10-CM | POA: Diagnosis not present

## 2021-11-16 MED ORDER — BENZONATATE 100 MG PO CAPS: ORAL_CAPSULE | ORAL | 0 refills | Status: DC

## 2021-11-16 NOTE — Progress Notes (Signed)
Virtual Visit via Video Note  I connected with Sarah [name]  on 11/16/21 at  4:00 PM EST by a video enabled telemedicine application and verified that I am speaking with the correct person using two identifiers.  Location patient: [place] Location provider:work or home office Persons participating in the virtual visit: patient, provider  I discussed the limitations and requested verbal permission for telemedicine visit. The patient expressed understanding and agreed to proceed.   HPI:  Acute telemedicine visit for : -Onset: 2 days ago -patient tested negative for covid yesterday -Symptoms include:sore throat, cough, nasal congestion, feels tired -Denies:fever, CP, SOB, NVD -daughter is sick with similar symptoms - she tested negative x2 for covid and was dx with adenovirus by peds  -Pertinent past medical history: see below -Pertinent medication allergies: Allergies  Allergen Reactions   Azithromycin     GI-- stomach cramps   Penicillins Hives    Did it involve swelling of the face/tongue/throat, SOB, or low BP? No Did it involve sudden or severe rash/hives, skin peeling, or any reaction on the inside of your mouth or nose? No Did you need to seek medical attention at a hospital or doctor's office? No When did it last happen?    3-4 years ago   If all above answers are NO, may proceed with cephalosporin use.   -COVID-19 vaccine status:  Immunization History  Administered Date(s) Administered   Tdap 07/24/2019     ROS: See pertinent positives and negatives per HPI.  Past Medical History:  Diagnosis Date   Anxiety    Bell's palsy    Gall stones    Gestational hypertension 10/24/2019   Headache    migraines   Hypothyroidism    Ovarian cyst    Thyroid disease     Past Surgical History:  Procedure Laterality Date   CHOLECYSTECTOMY N/A 01/25/2020   Procedure: LAPAROSCOPIC CHOLECYSTECTOMY WITH INTRAOPERATIVE CHOLANGIOGRAM;  Surgeon: Griselda Miner, MD;  Location: [facility] OR;   Service: General;  Laterality: N/A;   LASIK  2017   NO PAST SURGERIES     wisdom tooth removal       Current Outpatient Medications:    benzonatate (TESSALON PERLES) 100 MG capsule, 1-2 capsules up to twice daily as needed for cough, Disp: 30 capsule, Rfl: 0   fexofenadine (ALLEGRA) 180 MG tablet, Take 180 mg by mouth daily., Disp: , Rfl:    FLUoxetine (PROZAC) 10 MG capsule, Take 1 capsule (10 mg total) by mouth daily., Disp: 30 capsule, Rfl: 3   fluticasone (FLONASE) 50 MCG/ACT nasal spray, Place 2 sprays into both nostrils daily as needed for allergies or rhinitis., Disp: , Rfl:    thyroid (NP THYROID) 30 MG tablet, Take 1 tablet (30 mg total) by mouth daily before breakfast., Disp: 90 tablet, Rfl: 1   trimethoprim-polymyxin b (POLYTRIM) ophthalmic solution, Place 2 drops into the left eye 3 (three) times daily. Use no longer than 7 days, Disp: 10 mL, Rfl: 0  EXAM:  VITALS per patient if applicable:  GENERAL: alert, oriented, appears well and in no acute distress  HEENT: atraumatic, conjunttiva clear, no obvious abnormalities on inspection of external nose and ears  NECK: normal movements of the head and neck  LUNGS: on inspection no signs of respiratory distress, breathing rate appears normal, no obvious gross SOB, gasping or wheezing  CV: no obvious cyanosis  MS: moves all visible extremities without noticeable abnormality  PSYCH/NEURO: pleasant and cooperative, no obvious depression or anxiety, speech and thought processing grossly  intact  ASSESSMENT AND PLAN:  Discussed the following assessment and plan:  Nasal congestion  -we discussed possible serious and likely etiologies, options for evaluation and workup, limitations of telemedicine visit vs in person visit, treatment, treatment risks and precautions. Pt is agreeable to treatment via telemedicine at this moment. Likely [facility] vs other. Opted for treatment with nasal saline rinse, other symptomatic care measures per  patient instructions and Tessalon rx for cough. Also adivse one more rapid covid test 48 hours after initial, though seems covid is less likely.   Advise to seek prompt virtual visit or in person care if worsening, new symptoms arise, or if is not improving with treatment as expected per our conversation of expected course. Discussed options for follow up care.  I discussed the assessment and treatment plan with the patient. The patient was provided an opportunity to ask questions and all were answered. The patient agreed with the plan and demonstrated an understanding of the instructions.     [name], DO

## 2021-11-16 NOTE — Patient Instructions (Signed)
°  HOME CARE TIPS:   -I sent the medication(s) we discussed to your pharmacy: Meds ordered this encounter  Medications   benzonatate (TESSALON PERLES) 100 MG capsule    Sig: 1-2 capsules up to twice daily as needed for cough    Dispense:  30 capsule    Refill:  0     -can use tylenol or aleve if needed for fevers, aches and pains per instructions  -can use nasal saline a few times per day if you have nasal congestion; sometimes  a short course of Afrin nasal spray for 3 days can help with symptoms as well  -warm salt water gargles and steam can also help  -stay hydrated, drink plenty of fluids and eat small healthy meals - avoid dairy  -can take 1000 IU ( ) Vit D3 and 100-500 mg of Vit C daily per instructions  -If the Covid test is positive, check out the CDC website for more information on home care, transmission and treatment for COVID19  -follow up with your doctor in 2-3 days unless improving and feeling better  -stay home while sick, except to seek medical care. If you have COVID19, you will likely be contagious for 7-10 days. Flu or Influenza is likely contagious for about 7 days. Other respiratory viral infections remain contagious for 5-10+ days depending on the virus and many other factors. Wear a good mask that fits snugly (such as N95 or KN95) if around others to reduce the risk of transmission.  It was nice to meet you today, and I really hope you are feeling better soon. I help [facility] out with telemedicine visits on Tuesdays and Thursdays and am happy to help if you need a follow up virtual visit on those days. Otherwise, if you have any concerns or questions following this visit please schedule a follow up visit with your Primary Care doctor or seek care at a local urgent care clinic to avoid delays in care.    Seek in person care or schedule a follow up video visit promptly if your symptoms worsen, new concerns arise or you are not improving with treatment. Call  911 and/or seek emergency care if your symptoms are severe or life threatening.

## 2021-11-17 ENCOUNTER — Telehealth (INDEPENDENT_AMBULATORY_CARE_PROVIDER_SITE_OTHER): Payer: BC Managed Care – PPO | Admitting: Family Medicine

## 2021-11-17 ENCOUNTER — Encounter: Payer: Self-pay | Admitting: Family Medicine

## 2021-11-17 DIAGNOSIS — J329 Chronic sinusitis, unspecified: Secondary | ICD-10-CM | POA: Diagnosis not present

## 2021-11-17 DIAGNOSIS — B9689 Other specified bacterial agents as the cause of diseases classified elsewhere: Secondary | ICD-10-CM | POA: Diagnosis not present

## 2021-11-17 MED ORDER — DOXYCYCLINE HYCLATE 100 MG PO TABS: 100.0000 mg | ORAL_TABLET | Freq: Two times a day (BID) | ORAL | 0 refills | Status: DC

## 2021-11-17 NOTE — Telephone Encounter (Signed)
Called patient and set her up for a virtual visit with Beverely Low

## 2021-11-17 NOTE — Progress Notes (Signed)
° °  Virtual Visit via Video   I connected with patient on 11/17/21 at 11:30 AM EST by a video enabled telemedicine application and verified that I am speaking with the correct person using two identifiers.  Location patient: Home Location provider: [place] [facility], Office Persons participating in the virtual visit: Patient, Provider, CMA Tresa Endo C)  I discussed the limitations of evaluation and management by telemedicine and the availability of in person appointments. The patient expressed understanding and agreed to proceed.  Subjective:   HPI:   URI- Saw Dr Selena Batten yesterday who felt this was a viral illness.  Daughter got sick from daycare last week.  Sxs started Monday night.  Pt feels she is just getting 'worse and worse'.  Daughter was dx'd w/ double ear infxn, sinus infxn, adenovirus.  Developed fever to 101.8 last night.  + body aches.  + nasal congestion.  Cough is productive.  + sinus pain/pressure.  R sided LN is 'super swollen and painful'.  + tooth pain.  Denies skin sensitivity.  Pt is COVID negative.    ROS:   See pertinent positives and negatives per HPI.  Patient Active Problem List   Diagnosis Date Noted   Anxiety and depression 11/09/2021   Gallstones 07/24/2019   Hypothyroidism 03/31/2019    Social History   Tobacco Use   Smoking status: Never   Smokeless tobacco: Never  Substance Use Topics   Alcohol use: Not Currently    Comment: socially    Current Outpatient Medications:    benzonatate (TESSALON PERLES) 100 MG capsule, 1-2 capsules up to twice daily as needed for cough, Disp: 30 capsule, Rfl: 0   fexofenadine (ALLEGRA) 180 MG tablet, Take 180 mg by mouth daily., Disp: , Rfl:    FLUoxetine (PROZAC) 10 MG capsule, Take 1 capsule (10 mg total) by mouth daily., Disp: 30 capsule, Rfl: 3   fluticasone (FLONASE) 50 MCG/ACT nasal spray, Place 2 sprays into both nostrils daily as needed for allergies or rhinitis., Disp: , Rfl:    thyroid (NP THYROID) 30 MG  tablet, Take 1 tablet (30 mg total) by mouth daily before breakfast., Disp: 90 tablet, Rfl: 1   trimethoprim-polymyxin b (POLYTRIM) ophthalmic solution, Place 2 drops into the left eye 3 (three) times daily. Use no longer than 7 days, Disp: 10 mL, Rfl: 0  Allergies  Allergen Reactions   Azithromycin     GI-- stomach cramps   Penicillins Hives    Did it involve swelling of the face/tongue/throat, SOB, or low BP? No Did it involve sudden or severe rash/hives, skin peeling, or any reaction on the inside of your mouth or nose? No Did you need to seek medical attention at a hospital or doctor's office? No When did it last happen?    3-4 years ago   If all above answers are NO, may proceed with cephalosporin use.     Objective:   LMP 10/31/2021 (Approximate)  AAOx3, NAD NCAT, EOMI No obvious CN deficits Coloring WNL Pt is able to speak clearly, coherently without shortness of breath or increased work of breathing.  Thought process is linear.  Mood is appropriate.   Assessment and Plan:   Bacterial sinusitis- new.  Pt's worsening sxs, fever, facial and tooth pain are consistent w/ infxn.  Start Doxy since she is allergic to Amox.  Reviewed supportive care and red flags that should prompt return.  Pt expressed understanding and is in agreement w/ plan.    Neena Rhymes, MD 11/17/2021

## 2021-11-22 ENCOUNTER — Encounter: Payer: Self-pay | Admitting: Family Medicine

## 2021-12-09 ENCOUNTER — Encounter: Payer: Self-pay | Admitting: Family Medicine

## 2021-12-11 MED ORDER — FLUOXETINE HCL 20 MG PO CAPS: 20.0000 mg | ORAL_CAPSULE | Freq: Every day | ORAL | 1 refills | Status: DC

## 2022-04-26 ENCOUNTER — Encounter: Payer: Self-pay | Admitting: Family Medicine

## 2022-04-26 ENCOUNTER — Telehealth (INDEPENDENT_AMBULATORY_CARE_PROVIDER_SITE_OTHER): Payer: Self-pay | Admitting: Family Medicine

## 2022-04-26 VITALS — Temp 98.6°F

## 2022-04-26 DIAGNOSIS — R0981 Nasal congestion: Secondary | ICD-10-CM

## 2022-04-26 DIAGNOSIS — R059 Cough, unspecified: Secondary | ICD-10-CM

## 2022-04-26 DIAGNOSIS — Z8709 Personal history of other diseases of the respiratory system: Secondary | ICD-10-CM

## 2022-04-26 DIAGNOSIS — J029 Acute pharyngitis, unspecified: Secondary | ICD-10-CM

## 2022-04-26 MED ORDER — ALBUTEROL SULFATE HFA 108 (90 BASE) MCG/ACT IN AERS: 2.0000 | INHALATION_SPRAY | Freq: Four times a day (QID) | RESPIRATORY_TRACT | 0 refills | Status: DC | PRN

## 2022-04-26 MED ORDER — DOXYCYCLINE HYCLATE 100 MG PO TABS: 100.0000 mg | ORAL_TABLET | Freq: Two times a day (BID) | ORAL | 0 refills | Status: DC

## 2022-04-26 MED ORDER — BENZONATATE 100 MG PO CAPS: 100.0000 mg | ORAL_CAPSULE | Freq: Three times a day (TID) | ORAL | 0 refills | Status: DC | PRN

## 2022-04-26 NOTE — Progress Notes (Signed)
Virtual Visit via Video Note  I connected with Sarah [name]  on 04/26/22 at 12:40 PM EDT by a video enabled telemedicine application and verified that I am speaking with the correct person using two identifiers.  Location patient: [place] Location provider:work or home office Persons participating in the virtual visit: patient, provider  I discussed the limitations and requested verbal permission for telemedicine visit. The patient expressed understanding and agreed to proceed.   HPI:  Acute telemedicine visit for sore throat and congestion: -Onset: over 1 week, was getting worse the last few days but now today is feeling better -Symptoms include: nasal congestion, sore throat, cough, pnd, some sinus discomfort, has had some body aches, feels like is in her chest with mild SOB or tightness at times - reports is prone to bronchitis and this type of symptom when gets sick -Denies: CP, SOB, NVD, fever, known sick contacts -Has tried: alka seltzer, theraflu -FDLMP: due tomorrow, she reports had a negative pregnancy test at home today -Pertinent past medical history: see below, reports propensity for sinusitis or bronchitis -Pertinent medication allergies: Allergies  Allergen Reactions   Azithromycin     GI-- stomach cramps   Penicillins Hives    Did it involve swelling of the face/tongue/throat, SOB, or low BP? No Did it involve sudden or severe rash/hives, skin peeling, or any reaction on the inside of your mouth or nose? No Did you need to seek medical attention at a hospital or doctor's office? No When did it last happen?    3-4 years ago   If all above answers are "NO", may proceed with cephalosporin use.   -COVID-19 vaccine status:  Immunization History  Administered Date(s) Administered   Tdap 07/24/2019     ROS: See pertinent positives and negatives per HPI.  Past Medical History:  Diagnosis Date   Anxiety    Bell's palsy    Gall stones    Gestational hypertension 10/24/2019    Headache    migraines   Hypothyroidism    Ovarian cyst    Thyroid disease     Past Surgical History:  Procedure Laterality Date   CHOLECYSTECTOMY N/A 01/25/2020   Procedure: LAPAROSCOPIC CHOLECYSTECTOMY WITH INTRAOPERATIVE CHOLANGIOGRAM;  Surgeon: Griselda Miner, MD;  Location: [facility] OR;  Service: General;  Laterality: N/A;   LASIK  2017   NO PAST SURGERIES     wisdom tooth removal       Current Outpatient Medications:    albuterol (PROAIR HFA) 108 (90 Base) MCG/ACT inhaler, Inhale 2 puffs into the lungs every 6 (six) hours as needed for wheezing or shortness of breath., Disp: 1 each, Rfl: 0   benzonatate (TESSALON PERLES) 100 MG capsule, Take 1 capsule (100 mg total) by mouth 3 (three) times daily as needed., Disp: 30 capsule, Rfl: 0   doxycycline (VIBRA-TABS) 100 MG tablet, Take 1 tablet (100 mg total) by mouth 2 (two) times daily., Disp: 20 tablet, Rfl: 0   fexofenadine (ALLEGRA) 180 MG tablet, Take 180 mg by mouth daily., Disp: , Rfl:    FLUoxetine (PROZAC) 20 MG capsule, Take 1 capsule (20 mg total) by mouth daily., Disp: 90 capsule, Rfl: 1   fluticasone (FLONASE) 50 MCG/ACT nasal spray, Place 2 sprays into both nostrils daily as needed for allergies or rhinitis., Disp: , Rfl:    thyroid (NP THYROID) 30 MG tablet, Take 1 tablet (30 mg total) by mouth daily before breakfast., Disp: 90 tablet, Rfl: 1   trimethoprim-polymyxin b (POLYTRIM) ophthalmic solution, Place 2  drops into the left eye 3 (three) times daily. Use no longer than 7 days (Patient not taking: Reported on 04/26/2022), Disp: 10 mL, Rfl: 0  EXAM:  VITALS per patient if applicable:  GENERAL: alert, oriented, appears well and in no acute distress  HEENT: atraumatic, conjunttiva clear, no obvious abnormalities on inspection of external nose and ears  NECK: normal movements of the head and neck  LUNGS: on inspection no signs of respiratory distress, breathing rate appears normal, no obvious gross SOB, gasping or  wheezing  CV: no obvious cyanosis  MS: moves all visible extremities without noticeable abnormality  PSYCH/NEURO: pleasant and cooperative, no obvious depression or anxiety, speech and thought processing grossly intact  ASSESSMENT AND PLAN:  Discussed the following assessment and plan:  Cough, unspecified type  Nasal congestion  Sore throat  History of bronchitis  -we discussed possible serious and likely etiologies, options for evaluation and workup, limitations of telemedicine visit vs in person visit, treatment, treatment risks and precautions. Pt is agreeable to treatment via telemedicine at this moment. Query VURI, bronchitis, vs developing sinusitis vs other. She has opted to try tessalon, alb rx and other symptomatic care measures per pt instructions with abx if worsening again or not improving. Had lengthy discussion about risks w/ abx and appropriate use.  Work/School slipped offered:  declined Advised to seek prompt virtual visit or in person care if worsening, new symptoms arise, or if is not improving with treatment as expected per our conversation of expected course. Discussed options for follow up care. Did let this patient know that I do telemedicine on Tuesdays and Thursdays for [facility]. I discussed the assessment and treatment plan with the patient. The patient was provided an opportunity to ask questions and all were answered. The patient agreed with the plan and demonstrated an understanding of the instructions.     [name], DO

## 2022-04-26 NOTE — Patient Instructions (Addendum)
---------------------------------------------------------------------------------------------------------------------------    WORK SLIP:  Patient Ramandeep Done,  1991/10/17, was seen for a medical visit today, 04/26/22 . Please excuse from work for a COVID/flu like illness. Please excuse from work until negative covid testing, no fever for 24 hours and symptoms improving.   Sincerely: E-signature: Dr. Colin Benton, DO [place] Ph: [phone]   ------------------------------------------------------------------------------------------------------------------------------   -I sent the medication(s) we discussed to your pharmacy: Meds ordered this encounter  Medications   benzonatate (TESSALON PERLES) 100 MG capsule    Sig: Take 1 capsule (100 mg total) by mouth 3 (three) times daily as needed.    Dispense:  30 capsule    Refill:  0   doxycycline (VIBRA-TABS) 100 MG tablet    Sig: Take 1 tablet (100 mg total) by mouth 2 (two) times daily.    Dispense:  20 tablet    Refill:  0   albuterol (PROAIR HFA) 108 (90 Base) MCG/ACT inhaler    Sig: Inhale 2 puffs into the lungs every 6 (six) hours as needed for wheezing or shortness of breath.    Dispense:  1 each    Refill:  0   Nasal saline twice daily  Warm salt water gargles twice daily for 3 days  Drink plenty of fluids, avoid dairy products  I hope you are feeling better soon!  Seek in person care promptly if your symptoms worsen, new concerns arise or you are not improving with treatment.  It was nice to meet you today. I help [facility] out with telemedicine visits on Tuesdays and Thursdays and am happy to help if you need a virtual follow up visit on those days. Otherwise, if you have any concerns or questions following this visit please schedule a follow up visit with your Primary Care office or seek care at a local urgent care clinic to avoid delays in care. If you are having severe or life  threatening symptoms please call 911 and/or go to the nearest emergency room.    Doxycycline Capsules or Tablets What is this medication? DOXYCYCLINE (dox i SYE kleen) treats infections caused by bacteria. It belongs to a group of medications called tetracycline antibiotics. It will not treat colds, the flu, or infections caused by viruses. This medicine may be used for other purposes; ask your health care provider or pharmacist if you have questions. COMMON BRAND NAME(S): Acticlate, Adoxa, Adoxa CK, Adoxa Pak, Adoxa TT, Alodox, Avidoxy, Doxal, LYMEPAK, Mondoxyne NL, Monodox, Morgidox 1x, Morgidox 1x Kit, Morgidox 2x, Morgidox 2x Kit, NutriDox, Ocudox, [place], [place], [place], Vibra-Tabs, Vibramycin What should I tell my care team before I take this medication? They need to know if you have any of these conditions: Kidney disease Liver disease Long exposure to sunlight like working outdoors Recent stomach surgery Stomach or intestine problems such as colitis Vision Problems Yeast or fungal infection of the mouth or vagina An unusual or allergic reaction to doxycycline, tetracycline antibiotics, other medications, foods, dyes, or preservatives Pregnant or trying to get pregnant Breast-feeding How should I use this medication? Take this medication by mouth with water. Take it as directed on the prescription label at the same time every day. It is best to take this medication without food, but if it upsets your stomach take it with food. Take all of this medication unless your care team tells you to stop it early. Keep taking it even if you think you are better. Take antacids and products with aluminum, calcium, magnesium,  iron, and zinc in them at a different time of day than this medication. Talk to your care team if you have questions. Talk to your care team regarding the use of this medication in children. While this medication may be prescribed for selected conditions, precautions do  apply. Overdosage: If you think you have taken too much of this medicine contact a poison control center or emergency room at once. NOTE: This medicine is only for you. Do not share this medicine with others. What if I miss a dose? If you miss a dose, take it as soon as you can. If it is almost time for your next dose, take only that dose. Do not take double or extra doses. What may interact with this medication? Antacids, vitamins, or other products that contain aluminum, calcium, iron, magnesium, or zinc Barbiturates Birth control pills Bismuth subsalicylate Carbamazepine Methoxyflurane Oral retinoids such as acitretin, isotretinoin Other antibiotics Phenytoin Warfarin This list may not describe all possible interactions. Give your health care provider a list of all the medicines, herbs, non-prescription drugs, or dietary supplements you use. Also tell them if you smoke, drink alcohol, or use illegal drugs. Some items may interact with your medicine. What should I watch for while using this medication? Tell your care team if your symptoms do not improve. Do not treat diarrhea with over the counter products. Contact your care team if you have diarrhea that lasts more than 2 days or if it is severe and watery. Do not take this medication just before going to bed. It may not dissolve properly when you lay down and can cause pain in your throat. Drink plenty of fluids while taking this medication to also help reduce irritation in your throat. This medication can make you more sensitive to the sun. Keep out of the sun. If you cannot avoid being in the sun, wear protective clothing and use sunscreen. Do not use sun lamps or tanning beds/booths. Birth control pills may not work properly while you are taking this medication. Talk to your care team about using an extra method of birth control. If you are being treated for a sexually transmitted infection, avoid sexual contact until you have finished  your treatment. Your sexual partner may also need treatment. If you are using this medication to prevent malaria, you should still protect yourself from contact with mosquitos. Stay in screened-in areas, use mosquito nets, keep your body covered, and use an insect repellent. What side effects may I notice from receiving this medication? Side effects that you should report to your care team as soon as possible: Allergic reactions--skin rash, itching, hives, swelling of the face, lips, tongue, or throat Increased pressure around the brain--severe headache, change in vision, blurry vision, nausea, vomiting Joint pain Pain or trouble swallowing Redness, blistering, peeling, or loosening of the skin, including inside the mouth Severe diarrhea, fever Unusual vaginal discharge, itching, or odor Side effects that usually do not require medical attention (report these to your care team if they continue or are bothersome): Change in tooth color Diarrhea Headache Heartburn Nausea This list may not describe all possible side effects. Call your doctor for medical advice about side effects. You may report side effects to FDA at 1-800-FDA-1088. Where should I keep my medication? Keep out of the reach of children and pets. Store at room temperature, below 30 degrees C (86 degrees F). Protect from light. Keep container tightly closed. Throw away any unused medication after the expiration date. Taking this medication after  the expiration date can make you seriously ill. NOTE: This sheet is a summary. It may not cover all possible information. If you have questions about this medicine, talk to your doctor, pharmacist, or health care provider.  2023 Elsevier/Gold Standard (2020-12-24 00:00:00)

## 2022-05-04 ENCOUNTER — Encounter: Payer: Self-pay | Admitting: Family Medicine

## 2022-05-30 ENCOUNTER — Encounter: Payer: Self-pay | Admitting: Family Medicine

## 2022-05-31 ENCOUNTER — Encounter: Payer: Self-pay | Admitting: Family

## 2022-05-31 ENCOUNTER — Ambulatory Visit: Payer: BC Managed Care – PPO | Admitting: Family

## 2022-05-31 VITALS — BP 108/73 | HR 66 | Temp 97.9°F | Ht 64.0 in | Wt 179.5 lb

## 2022-05-31 DIAGNOSIS — R0602 Shortness of breath: Secondary | ICD-10-CM

## 2022-05-31 DIAGNOSIS — N926 Irregular menstruation, unspecified: Secondary | ICD-10-CM

## 2022-05-31 DIAGNOSIS — E039 Hypothyroidism, unspecified: Secondary | ICD-10-CM

## 2022-05-31 LAB — POCT URINE PREGNANCY: Preg Test, Ur: NEGATIVE

## 2022-05-31 LAB — TSH: TSH: 2.81 u[IU]/mL (ref 0.35–5.50)

## 2022-05-31 LAB — HCG, QUANTITATIVE, PREGNANCY: Quantitative HCG: <0.6 m[IU]/mL

## 2022-05-31 LAB — T4, FREE: Free T4: 0.75 ng/dL (ref 0.60–1.60)

## 2022-05-31 NOTE — Addendum Note (Signed)
Addended byDulce Sellar on: 05/31/2022 11:43 AM   Modules accepted: Orders

## 2022-05-31 NOTE — Progress Notes (Signed)
Patient ID: Sarah [name], female    DOB: Mar 21, 1991, 30 y.o.   MRN: [id number]  Chief Complaint  Patient presents with   Menstrual Problem    Pt states she is trying to get pregnant, Her cycle is a week late. Pt has hypothyroidism and would like to know if this can contribute to. Would like blood pregnancy testing and thyroid levels checked. Last day of sexual activity on July 23,2023.    Shortness of Breath    Pt states she was diagnosed with bronchitis a month ago. Pt states she has been having breathing issues.     HPI: Hypothyroidism: Patient presents today for followup of Hypothyroidism.  Patient reports positive compliance with daily medication.  Patient denies any of the following symptoms: fatigue, cold intolerance, constipation, weight gain or inability to lose weight, muscle weakness, mental slowing, dry hair and skin. Last TSH and free T4: Lab Results  Component Value Date   FREE T4 0.77 11/09/2021   FREE T4 1.1 12/26/2018   TSH 1.79 11/09/2021   TSH 1.71 06/10/2020   TSH 3.240 07/24/2019   Missed menses:  LMP 04/27/2022, denies any breast tenderness, or nausea, has been trying to get pregnant for 4 mos, states she is 4 days late for cycle. She is concerned her thyroid levels may be off & contributing to difficulty in getting pregnant.  SOB:  bronchitis a month ago, still having some SOB with activity, denies chest tightness, no cough, no fever. Using rescue inhaler prn.   Assessment & Plan:  1. Missed menses POCT UPT negative, pt requesting serum testing and thyroid levels.   - POCT urine pregnancy - hCG, serum, qualitative - T4, free - TSH  2. Hypothyroidism, unspecified type Taking NP Thyroid daily. Advised pt last levels in normal range, no change in med doses, very doubtful there will be any fluctuation if taking med qd.  - T4, free - TSH  3. SOB (shortness of breath) on exertion recent hx of bronchitis, pt reported severe coughing and SOB at that time,  has improved, but still having mild SOB w/exertion. Advised pt it can take up to 3 months to fully recover from a lung infection. Advised to keep her inhaler with her at all times as continue to use as needed to help sx. Will call back if refill is needed.  Subjective:    Outpatient Medications Prior to Visit  Medication Sig Dispense Refill   albuterol (PROAIR HFA) 108 (90 Base) MCG/ACT inhaler Inhale 2 puffs into the lungs every 6 (six) hours as needed for wheezing or shortness of breath. 1 each 0   fexofenadine (ALLEGRA) 180 MG tablet Take 180 mg by mouth daily.     fluticasone (FLONASE) 50 MCG/ACT nasal spray Place 2 sprays into both nostrils daily as needed for allergies or rhinitis.     thyroid (NP THYROID) 30 MG tablet Take 1 tablet (30 mg total) by mouth daily before breakfast. 90 tablet 1   benzonatate (TESSALON PERLES) 100 MG capsule Take 1 capsule (100 mg total) by mouth 3 (three) times daily as needed. 30 capsule 0   doxycycline (VIBRA-TABS) 100 MG tablet Take 1 tablet (100 mg total) by mouth 2 (two) times daily. 20 tablet 0   FLUoxetine (PROZAC) 20 MG capsule Take 1 capsule (20 mg total) by mouth daily. 90 capsule 1   trimethoprim-polymyxin b (POLYTRIM) ophthalmic solution Place 2 drops into the left eye 3 (three) times daily. Use no longer than 7 days (Patient not  taking: Reported on 04/26/2022) 10 mL 0   No facility-administered medications prior to visit.   Past Medical History:  Diagnosis Date   Anxiety    Bell's palsy    Gall stones    Gestational hypertension 10/24/2019   Headache    migraines   Hypothyroidism    Ovarian cyst    Thyroid disease    Past Surgical History:  Procedure Laterality Date   CHOLECYSTECTOMY N/A 01/25/2020   Procedure: LAPAROSCOPIC CHOLECYSTECTOMY WITH INTRAOPERATIVE CHOLANGIOGRAM;  Surgeon: Griselda Miner, MD;  Location: [facility] OR;  Service: General;  Laterality: N/A;   LASIK  2017   NO PAST SURGERIES     wisdom tooth removal     Allergies   Allergen Reactions   Azithromycin     GI-- stomach cramps   Penicillins Hives    Did it involve swelling of the face/tongue/throat, SOB, or low BP? No Did it involve sudden or severe rash/hives, skin peeling, or any reaction on the inside of your mouth or nose? No Did you need to seek medical attention at a hospital or doctor's office? No When did it last happen?    3-4 years ago   If all above answers are "NO", may proceed with cephalosporin use.       Objective:    Physical Exam Vitals and nursing note reviewed.  Constitutional:      Appearance: Normal appearance.  Cardiovascular:     Rate and Rhythm: Normal rate and regular rhythm.  Pulmonary:     Effort: Pulmonary effort is normal.     Breath sounds: Normal breath sounds.  Musculoskeletal:        General: Normal range of motion.  Skin:    General: Skin is warm and dry.  Neurological:     Mental Status: She is alert.  Psychiatric:        Mood and Affect: Mood normal.        Behavior: Behavior normal.    BP 108/73 (BP Location: Left Arm, Patient Position: Sitting, Cuff Size: Large)   Pulse 66   Temp 97.9 F (36.6 C) (Temporal)   Ht 5\' 4"  (1.626 m)   Wt 179 lb 8 oz (81.4 kg)   LMP 04/27/2022 (Exact Date)   SpO2 100%   Breastfeeding No   BMI 30.81 kg/m  Wt Readings from Last 3 Encounters:  05/31/22 179 lb 8 oz (81.4 kg)  11/09/21 167 lb (75.8 kg)  07/17/21 160 lb (72.6 kg)       07/19/21, NP

## 2022-05-31 NOTE — Addendum Note (Signed)
Addended by: Lorn Junes on: 05/31/2022 11:45 AM   Modules accepted: Orders

## 2022-06-02 NOTE — Progress Notes (Signed)
As you see, your serum pregnancy test is negative and your thyroid level is in normal range.

## 2022-06-09 ENCOUNTER — Encounter: Payer: Self-pay | Admitting: Family Medicine

## 2022-06-11 ENCOUNTER — Telehealth: Payer: BC Managed Care – PPO | Admitting: Physician Assistant

## 2022-06-11 DIAGNOSIS — B9689 Other specified bacterial agents as the cause of diseases classified elsewhere: Secondary | ICD-10-CM

## 2022-06-11 DIAGNOSIS — J069 Acute upper respiratory infection, unspecified: Secondary | ICD-10-CM | POA: Diagnosis not present

## 2022-06-11 MED ORDER — PREDNISONE 20 MG PO TABS: 20.0000 mg | ORAL_TABLET | Freq: Every day | ORAL | 0 refills | Status: DC

## 2022-06-11 MED ORDER — CEFDINIR 300 MG PO CAPS: 300.0000 mg | ORAL_CAPSULE | Freq: Two times a day (BID) | ORAL | 0 refills | Status: DC

## 2022-06-11 NOTE — Patient Instructions (Signed)
Sarah [name], thank you for joining Sarah Loveless, PA-C for today's virtual visit.  While this provider is not your primary care provider (PCP), if your PCP is located in our provider database this encounter information will be shared with them immediately following your visit.  Consent: (Patient) Sarah [name] provided verbal consent for this virtual visit at the beginning of the encounter.  Current Medications:  Current Outpatient Medications:    cefdinir (OMNICEF) 300 MG capsule, Take 1 capsule (300 mg total) by mouth 2 (two) times daily., Disp: 20 capsule, Rfl: 0   predniSONE (DELTASONE) 20 MG tablet, Take 1 tablet (20 mg total) by mouth daily with breakfast., Disp: 5 tablet, Rfl: 0   albuterol (PROAIR HFA) 108 (90 Base) MCG/ACT inhaler, Inhale 2 puffs into the lungs every 6 (six) hours as needed for wheezing or shortness of breath., Disp: 1 each, Rfl: 0   fexofenadine (ALLEGRA) 180 MG tablet, Take 180 mg by mouth daily., Disp: , Rfl:    fluticasone (FLONASE) 50 MCG/ACT nasal spray, Place 2 sprays into both nostrils daily as needed for allergies or rhinitis., Disp: , Rfl:    thyroid (NP THYROID) 30 MG tablet, Take 1 tablet (30 mg total) by mouth daily before breakfast., Disp: 90 tablet, Rfl: 1   Medications ordered in this encounter:  Meds ordered this encounter  Medications   cefdinir (OMNICEF) 300 MG capsule    Sig: Take 1 capsule (300 mg total) by mouth 2 (two) times daily.    Dispense:  20 capsule    Refill:  0    Order Specific Question:   Supervising Provider    Answer:   MILLER, BRIAN [[id number]]   predniSONE (DELTASONE) 20 MG tablet    Sig: Take 1 tablet (20 mg total) by mouth daily with breakfast.    Dispense:  5 tablet    Refill:  0    Order Specific Question:   Supervising Provider    Answer:   Hyacinth Meeker, BRIAN [[id number]]     *If you need refills on other medications prior to your next appointment, please contact your pharmacy*  Follow-Up: Call back or seek an  in-person evaluation if the symptoms worsen or if the condition fails to improve as anticipated.  Other Instructions  Upper Respiratory Infection, Adult An upper respiratory infection (URI) affects the nose, throat, and upper airways that lead to the lungs. The most common type of URI is often called the common cold. URIs usually get better on their own, without medical treatment. What are the causes? A URI is caused by a germ (virus). You may catch these germs by: Breathing in droplets from an infected person's cough or sneeze. Touching something that has the germ on it (is contaminated) and then touching your mouth, nose, or eyes. What increases the risk? You are more likely to get a URI if: You are very young or very old. You have close contact with others, such as at work, school, or a health care facility. You smoke. You have long-term (chronic) heart or lung disease. You have a weakened disease-fighting system (immune system). You have nasal allergies or asthma. You have a lot of stress. You have poor nutrition. What are the signs or symptoms? Runny or stuffy (congested) nose. Cough. Sneezing. Sore throat. Headache. Feeling tired (fatigue). Fever. Not wanting to eat as much as usual. Pain in your forehead, behind your eyes, and over your cheekbones (sinus pain). Muscle aches. Redness or irritation of the eyes. Pressure in the ears  or face. How is this treated? URIs usually get better on their own within 7-10 days. Medicines cannot cure URIs, but your doctor may recommend certain medicines to help relieve symptoms, such as: Over-the-counter cold medicines. Medicines to reduce coughing (cough suppressants). Coughing is a type of defense against infection that helps to clear the nose, throat, windpipe, and lungs (respiratory system). Take these medicines only as told by your doctor. Medicines to lower your fever. Follow these instructions at home: Activity Rest as  needed. If you have a fever, stay home from work or school until your fever is gone, or until your doctor says you may return to work or school. You should stay home until you cannot spread the infection anymore (you are not contagious). Your doctor may have you wear a face mask so you have less risk of spreading the infection. Relieving symptoms Rinse your mouth often with salt water. To make salt water, dissolve -1 tsp (3-6 g) of salt in 1 cup (237 mL) of warm water. Use a cool-mist humidifier to add moisture to the air. This can help you breathe more easily. Eating and drinking  Drink enough fluid to keep your pee (urine) pale yellow. Eat soups and other clear broths. General instructions  Take over-the-counter and prescription medicines only as told by your doctor. Do not smoke or use any products that contain nicotine or tobacco. If you need help quitting, ask your doctor. Avoid being where people are smoking (avoid secondhand smoke). Stay up to date on all your shots (immunizations), and get the flu shot every year. Keep all follow-up visits. How to prevent the spread of infection to others  Wash your hands with soap and water for at least 20 seconds. If you cannot use soap and water, use hand sanitizer. Avoid touching your mouth, face, eyes, or nose. Cough or sneeze into a tissue or your sleeve or elbow. Do not cough or sneeze into your hand or into the air. Contact a doctor if: You are getting worse, not better. You have any of these: A fever or chills. Brown or red mucus in your nose. Yellow or brown fluid (discharge)coming from your nose. Pain in your face, especially when you bend forward. Swollen neck glands. Pain when you swallow. White areas in the back of your throat. Get help right away if: You have shortness of breath that gets worse. You have very bad or constant: Headache. Ear pain. Pain in your forehead, behind your eyes, and over your cheekbones (sinus  pain). Chest pain. You have long-lasting (chronic) lung disease along with any of these: Making high-pitched whistling sounds when you breathe, most often when you breathe out (wheezing). Long-lasting cough (more than 14 days). Coughing up blood. A change in your usual mucus. You have a stiff neck. You have changes in your: Vision. Hearing. Thinking. Mood. These symptoms may be an emergency. Get help right away. Call 911. Do not wait to see if the symptoms will go away. Do not drive yourself to the hospital. Summary An upper respiratory infection (URI) is caused by a germ (virus). The most common type of URI is often called the common cold. URIs usually get better within 7-10 days. Take over-the-counter and prescription medicines only as told by your doctor. This information is not intended to replace advice given to you by your health care provider. Make sure you discuss any questions you have with your health care provider. Document Revised: 05/10/2021 Document Reviewed: 05/10/2021 Elsevier Patient Education  2023  [place].    If you have been instructed to have an in-person evaluation today at a local Urgent Care facility, please use the link below. It will take you to a list of all of our available [facility], including address, phone number and hours of operation. Please do not delay care.  [facility]  If you or a family member do not have a primary care provider, use the link below to schedule a visit and establish care. When you choose a [facility] primary care physician or advanced practice provider, you gain a long-term partner in health. Find a Primary Care Provider  Learn more about [facility]'s in-office and virtual care options: [facility]

## 2022-06-11 NOTE — Progress Notes (Signed)
Virtual Visit Consent   Sarah [name], you are scheduled for a virtual visit with a [facility] provider today. Just as with appointments in the office, your consent must be obtained to participate. Your consent will be active for this visit and any virtual visit you may have with one of our providers in the next 365 days. If you have a MyChart account, a copy of this consent can be sent to you electronically.  As this is a virtual visit, video technology does not allow for your provider to perform a traditional examination. This may limit your provider's ability to fully assess your condition. If your provider identifies any concerns that need to be evaluated in person or the need to arrange testing (such as labs, EKG, etc.), we will make arrangements to do so. Although advances in technology are sophisticated, we cannot ensure that it will always work on either your end or our end. If the connection with a video visit is poor, the visit may have to be switched to a telephone visit. With either a video or telephone visit, we are not always able to ensure that we have a secure connection.  By engaging in this virtual visit, you consent to the provision of healthcare and authorize for your insurance to be billed (if applicable) for the services provided during this visit. Depending on your insurance coverage, you may receive a charge related to this service.  I need to obtain your verbal consent now. Are you willing to proceed with your visit today? Sarah [name] has provided verbal consent on 06/11/2022 for a virtual visit (video or telephone). [name], PA-C  Date: 06/11/2022 10:10 AM  Virtual Visit via Video Note   IMargaretann Loveless, connected with  Sarah [name]  ([id number], 1990/11/19) on 06/11/22 at 10:00 AM EDT by a video-enabled telemedicine application and verified that I am speaking with the correct person using two identifiers.  Location: Patient: Virtual Visit  Location Patient: Home Provider: Virtual Visit Location Provider: Home Office   I discussed the limitations of evaluation and management by telemedicine and the availability of in person appointments. The patient expressed understanding and agreed to proceed.    History of Present Illness: Sarah [name] is a 31 y.o. who identifies as a female who was assigned female at birth, and is being seen today for URI symptoms.  HPI: URI  This is a new problem. The current episode started today. The problem has been gradually worsening. There has been no fever. Associated symptoms include congestion, coughing, ear pain (right), headaches, a plugged ear sensation (right), rhinorrhea and sinus pain. Pertinent negatives include no diarrhea, nausea, sore throat or swollen glands. She has tried acetaminophen, decongestant and NSAIDs for the symptoms. The treatment provided no relief.    Symptoms are felt to be from an [facility] unit in her work place and possible mold exposure.   Problems:  Patient Active Problem List   Diagnosis Date Noted   Anxiety and depression 11/09/2021   Gallstones 07/24/2019   Hypothyroidism 03/31/2019    Allergies:  Allergies  Allergen Reactions   Azithromycin     GI-- stomach cramps   Penicillins Hives    Did it involve swelling of the face/tongue/throat, SOB, or low BP? No Did it involve sudden or severe rash/hives, skin peeling, or any reaction on the inside of your mouth or nose? No Did you need to seek medical attention at a hospital or doctor's office? No When did it last happen?  3-4 years ago   If all above answers are "NO", may proceed with cephalosporin use.    Medications:  Current Outpatient Medications:    cefdinir (OMNICEF) 300 MG capsule, Take 1 capsule (300 mg total) by mouth 2 (two) times daily., Disp: 20 capsule, Rfl: 0   predniSONE (DELTASONE) 20 MG tablet, Take 1 tablet (20 mg total) by mouth daily with breakfast., Disp: 5 tablet, Rfl: 0   albuterol  (PROAIR HFA) 108 (90 Base) MCG/ACT inhaler, Inhale 2 puffs into the lungs every 6 (six) hours as needed for wheezing or shortness of breath., Disp: 1 each, Rfl: 0   fexofenadine (ALLEGRA) 180 MG tablet, Take 180 mg by mouth daily., Disp: , Rfl:    fluticasone (FLONASE) 50 MCG/ACT nasal spray, Place 2 sprays into both nostrils daily as needed for allergies or rhinitis., Disp: , Rfl:    thyroid (NP THYROID) 30 MG tablet, Take 1 tablet (30 mg total) by mouth daily before breakfast., Disp: 90 tablet, Rfl: 1  Observations/Objective: Patient is well-developed, well-nourished in no acute distress.  Resting comfortably at home.  Head is normocephalic, atraumatic.  No labored breathing.  Speech is clear and coherent with logical content.  Patient is alert and oriented at baseline.    Assessment and Plan: 1. Bacterial upper respiratory infection - cefdinir (OMNICEF) 300 MG capsule; Take 1 capsule (300 mg total) by mouth 2 (two) times daily.  Dispense: 20 capsule; Refill: 0 - predniSONE (DELTASONE) 20 MG tablet; Take 1 tablet (20 mg total) by mouth daily with breakfast.  Dispense: 5 tablet; Refill: 0  - Worsening symptoms that have not responded to OTC medications.  - Will give Omnicef and prednisone - Continue allergy medications.  - Steam and humidifier can help - Stay well hydrated and get plenty of rest.  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 The patient was provided an opportunity to ask questions and all were answered. The patient agreed with the plan and demonstrated an understanding of the  instructions.  A copy of instructions were sent to the patient via MyChart unless otherwise noted below.    The patient was advised to call back or seek an in-person evaluation if the symptoms worsen or if the condition fails to improve as anticipated.  Time:  I spent 15 minutes with the patient via telehealth technology discussing the above problems/concerns.    [name], PA-C

## 2022-06-18 ENCOUNTER — Encounter: Payer: Self-pay | Admitting: Family Medicine

## 2022-06-18 ENCOUNTER — Ambulatory Visit: Payer: BC Managed Care – PPO | Admitting: Family Medicine

## 2022-06-18 VITALS — BP 108/70 | HR 69 | Temp 98.0°F | Resp 16 | Ht 64.0 in | Wt 183.0 lb

## 2022-06-18 DIAGNOSIS — H6993 Unspecified Eustachian tube disorder, bilateral: Secondary | ICD-10-CM

## 2022-06-18 DIAGNOSIS — R052 Subacute cough: Secondary | ICD-10-CM | POA: Diagnosis not present

## 2022-06-18 DIAGNOSIS — J302 Other seasonal allergic rhinitis: Secondary | ICD-10-CM

## 2022-06-18 DIAGNOSIS — H6983 Other specified disorders of Eustachian tube, bilateral: Secondary | ICD-10-CM

## 2022-06-18 MED ORDER — MONTELUKAST SODIUM 10 MG PO TABS: 10.0000 mg | ORAL_TABLET | Freq: Every day | ORAL | 3 refills | Status: DC

## 2022-06-18 MED ORDER — AZELASTINE HCL 0.1 % NA SOLN: 2.0000 | Freq: Two times a day (BID) | NASAL | 12 refills | Status: DC

## 2022-06-18 MED ORDER — QVAR REDIHALER 40 MCG/ACT IN AERB: 1.0000 | INHALATION_SPRAY | Freq: Two times a day (BID) | RESPIRATORY_TRACT | 0 refills | Status: DC

## 2022-06-18 NOTE — Progress Notes (Signed)
   Subjective:    Patient ID: Sarah [name], female    DOB: 02/16/1991, 31 y.o.   MRN: [id number]  HPI Cough- pt was seen on 8/21 and started on Cefdinir 300mg  BID x10 days and Prednisone 20mg  daily x5 days.  Doesn't feel that medication has improved any of her sxs.  'i feel like I've been sick forever'.  Sxs started early July w/ fever, 'i think it was an ear infection'.  Started abx.  Continues to have ear pain.  Has ongoing PND, cough, some SOB.  There was mold in [facility] unit at work.  Currently on Allegra and Flonase.  Using Albuterol inhaler as needed.  Cough seems to be worse in the morning, taking OTC cough suppressants.  No fevers.   Review of Systems For ROS see HPI     Objective:   Physical Exam Vitals reviewed.  Constitutional:      General: She is not in acute distress.    Appearance: Normal appearance. She is well-developed. She is not ill-appearing.  HENT:     Head: Normocephalic and atraumatic.     Ears:     Comments: TMs retracted bilaterally EACs clear bilaterally    Nose: Congestion present.     Mouth/Throat:     Pharynx: Oropharyngeal exudate (copious PND) present.  Eyes:     Conjunctiva/sclera: Conjunctivae normal.     Pupils: Pupils are equal, round, and reactive to light.  Cardiovascular:     Rate and Rhythm: Normal rate and regular rhythm.     Heart sounds: Normal heart sounds. No murmur heard. Pulmonary:     Effort: Pulmonary effort is normal. No respiratory distress.     Breath sounds: Normal breath sounds. No wheezing.  Musculoskeletal:     Cervical back: Normal range of motion and neck supple.  Lymphadenopathy:     Cervical: No cervical adenopathy.  Skin:    General: Skin is warm and dry.  Neurological:     General: No focal deficit present.     Mental Status: She is alert and oriented to person, place, and time.  Psychiatric:        Mood and Affect: Mood normal.        Behavior: Behavior normal.        Thought Content: Thought content normal.           Assessment & Plan:   Eustachian tube dysfxn- new.  This is the source of her ear pain rather than infection.  Since she is already using Flonase, will add Singulair and Astelin.  Pt expressed understanding and is in agreement w/ plan.   Subacute cough- new.  Suspect this is post-infectious as she is not having any improvement w/ abx.  She did not like taking the short course of prednisone- made her feel flushed, jittery.  Will hold on pred taper but rather start ICS in addition to the rescue inhaler.  Hopefully better control of her allergies will improve cough.  Start Singulair and Astelin.  Pt expressed understanding and is in agreement w/ plan.   Allergies- not currently well controlled on Allegra and Flonase.  Add Singulair 10mg  nightly and Astelin nasal spray.  Pt expressed understanding and is in agreement w/ plan.

## 2022-06-18 NOTE — Patient Instructions (Addendum)
Follow up as needed or as scheduled I think this is a combination of post-viral inflammation and bad seasonal allergies CONTINUE Allegra daily ADD Singulair nightly USE the Flonase daily ADD the Azelastine nasal spray twice daily USE the Albuterol inhaler as needed for cough or shortness of breath TAKE the Beclomethasone inhaler (Qvar) twice daily until feeling better Drink LOTS of fluids Call with any questions or concerns Hang in there!

## 2022-07-24 ENCOUNTER — Telehealth: Payer: Self-pay

## 2022-07-24 NOTE — Telephone Encounter (Signed)
Left pt a VM to call office in regard to pharmacy refill request for Prozac . Chart states this medication is d/c need clarification

## 2022-07-26 ENCOUNTER — Other Ambulatory Visit: Payer: Self-pay

## 2022-07-26 MED ORDER — FLUOXETINE HCL 20 MG PO CAPS: 20.0000 mg | ORAL_CAPSULE | Freq: Every day | ORAL | 1 refills | Status: DC

## 2022-07-26 NOTE — Telephone Encounter (Signed)
Sent Prozac refill to pharmacy as stated by Dr Birdie Riddle

## 2022-08-21 ENCOUNTER — Telehealth: Payer: BC Managed Care – PPO | Admitting: Physician Assistant

## 2022-08-21 DIAGNOSIS — B9689 Other specified bacterial agents as the cause of diseases classified elsewhere: Secondary | ICD-10-CM

## 2022-08-21 DIAGNOSIS — J019 Acute sinusitis, unspecified: Secondary | ICD-10-CM | POA: Diagnosis not present

## 2022-08-21 MED ORDER — CEFDINIR 300 MG PO CAPS: 300.0000 mg | ORAL_CAPSULE | Freq: Two times a day (BID) | ORAL | 0 refills | Status: DC

## 2022-08-21 NOTE — Progress Notes (Signed)
Virtual Visit Consent   Sarah [name], you are scheduled for a virtual visit with a [facility] provider today. Just as with appointments in the office, your consent must be obtained to participate. Your consent will be active for this visit and any virtual visit you may have with one of our providers in the next 365 days. If you have a MyChart account, a copy of this consent can be sent to you electronically.  As this is a virtual visit, video technology does not allow for your provider to perform a traditional examination. This may limit your provider's ability to fully assess your condition. If your provider identifies any concerns that need to be evaluated in person or the need to arrange testing (such as labs, EKG, etc.), we will make arrangements to do so. Although advances in technology are sophisticated, we cannot ensure that it will always work on either your end or our end. If the connection with a video visit is poor, the visit may have to be switched to a telephone visit. With either a video or telephone visit, we are not always able to ensure that we have a secure connection.  By engaging in this virtual visit, you consent to the provision of healthcare and authorize for your insurance to be billed (if applicable) for the services provided during this visit. Depending on your insurance coverage, you may receive a charge related to this service.  I need to obtain your verbal consent now. Are you willing to proceed with your visit today? Sarah [name] has provided verbal consent on 08/21/2022 for a virtual visit (video or telephone). Sarah [name], [place]  Date: 08/21/2022 9:55 AM  Virtual Visit via Video Note   I, Sarah [name], connected with  Sarah [name]  ([id number], 1991/09/28) on 08/21/22 at  9:45 AM EDT by a video-enabled telemedicine application and verified that I am speaking with the correct person using two identifiers.  Location: Patient: Virtual Visit  Location Patient: Home Provider: Virtual Visit Location Provider: Home Office   I discussed the limitations of evaluation and management by telemedicine and the availability of in person appointments. The patient expressed understanding and agreed to proceed.    History of Present Illness: Sarah [name] is a 31 y.o. who identifies as a female who was assigned female at birth, and is being seen today for possible sinusitis.  Patient with history of substantial year-round allergies, noting increase in nasal and head congestion associated with sinus pressure maxillary sinus pain.  Denies fever, chills or aches.  Denies shortness of breath.  Denies recent travel or sick contact.  Notes this feels identical to prior sinus infections.  Did take a home COVID test to be cautious, which was negative.  Is taking her OTC antihistamine, nasal sprays and Singulair as directed.  Also utilizing Mucinex.  HPI: HPI  Problems:  Patient Active Problem List   Diagnosis Date Noted   Anxiety and depression 11/09/2021   Gallstones 07/24/2019   Hypothyroidism 03/31/2019    Allergies:  Allergies  Allergen Reactions   Azithromycin     GI-- stomach cramps   Penicillins Hives    Did it involve swelling of the face/tongue/throat, SOB, or low BP? No Did it involve sudden or severe rash/hives, skin peeling, or any reaction on the inside of your mouth or nose? No Did you need to seek medical attention at a hospital or doctor's office? No When did it last happen?    3-4 years ago   If  all above answers are "NO", may proceed with cephalosporin use.    Medications:  Current Outpatient Medications:    azelastine (ASTELIN) 0.1 % nasal spray, Place 2 sprays into both nostrils 2 (two) times daily. Use in each nostril as directed, Disp: 30 mL, Rfl: 12   fexofenadine (ALLEGRA) 180 MG tablet, Take 180 mg by mouth daily., Disp: , Rfl:    FLUoxetine (PROZAC) 20 MG capsule, Take 1 capsule (20 mg total) by mouth daily., Disp:  90 capsule, Rfl: 1   fluticasone (FLONASE) 50 MCG/ACT nasal spray, Place 2 sprays into both nostrils daily as needed for allergies or rhinitis., Disp: , Rfl:    montelukast (SINGULAIR) 10 MG tablet, Take 1 tablet (10 mg total) by mouth at bedtime., Disp: 30 tablet, Rfl: 3   thyroid (NP THYROID) 30 MG tablet, Take 1 tablet (30 mg total) by mouth daily before breakfast., Disp: 90 tablet, Rfl: 1  Observations/Objective: Patient is well-developed, well-nourished in no acute distress.  Resting comfortably at home.  Head is normocephalic, atraumatic.  No labored breathing. Speech is clear and coherent with logical content.  Patient is alert and oriented at baseline.   Assessment and Plan: 1. Acute bacterial sinusitis  Is penicillin allergic and cannot tolerate Z-Pak.  Her and her husband are currently trying to conceive, so will avoid doxycycline.  Will Rx cefdinir twice daily x10 days.  Supportive measures and OTC medications reviewed.  Follow-up with PCP if not resolving.  Follow Up Instructions: I discussed the assessment and treatment plan with the patient. The patient was provided an opportunity to ask questions and all were answered. The patient agreed with the plan and demonstrated an understanding of the instructions.  A copy of instructions were sent to the patient via MyChart unless otherwise noted below.   The patient was advised to call back or seek an in-person evaluation if the symptoms worsen or if the condition fails to improve as anticipated.  Time:  I spent 10 minutes with the patient via telehealth technology discussing the above problems/concerns.    Sarah Rio, PA-C

## 2022-08-21 NOTE — Patient Instructions (Signed)
Maryse Monsanto, thank you for joining Leeanne Rio, PA-C for today's virtual visit.  While this provider is not your primary care provider (PCP), if your PCP is located in our provider database this encounter information will be shared with them immediately following your visit.   [facility] [place] account gives you access to today's visit and all your visits, tests, and labs performed at [facility] " click here if you don't have a [facility] account or go to [email or web address]  Consent: (Patient) Leita Grupe provided verbal consent for this virtual visit at the beginning of the encounter.  Current Medications:  Current Outpatient Medications:    albuterol (PROAIR HFA) 108 (90 Base) MCG/ACT inhaler, Inhale 2 puffs into the lungs every 6 (six) hours as needed for wheezing or shortness of breath., Disp: 1 each, Rfl: 0   azelastine (ASTELIN) 0.1 % nasal spray, Place 2 sprays into both nostrils 2 (two) times daily. Use in each nostril as directed, Disp: 30 mL, Rfl: 12   beclomethasone (QVAR REDIHALER) 40 MCG/ACT inhaler, Inhale 1 puff into the lungs 2 (two) times daily., Disp: 1 each, Rfl: 0   cefdinir (OMNICEF) 300 MG capsule, Take 1 capsule (300 mg total) by mouth 2 (two) times daily., Disp: 20 capsule, Rfl: 0   fexofenadine (ALLEGRA) 180 MG tablet, Take 180 mg by mouth daily., Disp: , Rfl:    FLUoxetine (PROZAC) 20 MG capsule, Take 1 capsule (20 mg total) by mouth daily., Disp: 90 capsule, Rfl: 1   fluticasone (FLONASE) 50 MCG/ACT nasal spray, Place 2 sprays into both nostrils daily as needed for allergies or rhinitis., Disp: , Rfl:    montelukast (SINGULAIR) 10 MG tablet, Take 1 tablet (10 mg total) by mouth at bedtime., Disp: 30 tablet, Rfl: 3   thyroid (NP THYROID) 30 MG tablet, Take 1 tablet (30 mg total) by mouth daily before breakfast., Disp: 90 tablet, Rfl: 1   Medications ordered in this encounter:  No orders of the defined types were  placed in this encounter.    *If you need refills on other medications prior to your next appointment, please contact your pharmacy*  Follow-Up: Call back or seek an in-person evaluation if the symptoms worsen or if the condition fails to improve as anticipated.  [place] [phone]  Other Instructions Please take antibiotic as directed.  Increase fluid intake.  Use Saline nasal spray.  Take a daily multivitamin. Continue allergy medications.  Place a humidifier in the bedroom.  Please call or return clinic if symptoms are not improving.  Sinusitis Sinusitis is redness, soreness, and swelling (inflammation) of the paranasal sinuses. Paranasal sinuses are air pockets within the bones of your face (beneath the eyes, the middle of the forehead, or above the eyes). In healthy paranasal sinuses, mucus is able to drain out, and air is able to circulate through them by way of your nose. However, when your paranasal sinuses are inflamed, mucus and air can become trapped. This can allow bacteria and other germs to grow and cause infection. Sinusitis can develop quickly and last only a short time (acute) or continue over a long period (chronic). Sinusitis that lasts for more than 12 weeks is considered chronic.  CAUSES  Causes of sinusitis include: Allergies. Structural abnormalities, such as displacement of the cartilage that separates your nostrils (deviated septum), which can decrease the air flow through your nose and sinuses and affect sinus drainage. Functional abnormalities, such as when the small hairs (cilia)  that line your sinuses and help remove mucus do not work properly or are not present. SYMPTOMS  Symptoms of acute and chronic sinusitis are the same. The primary symptoms are pain and pressure around the affected sinuses. Other symptoms include: Upper toothache. Earache. Headache. Bad breath. Decreased sense of smell and taste. A cough, which worsens when you are  lying flat. Fatigue. Fever. Thick drainage from your nose, which often is green and may contain pus (purulent). Swelling and warmth over the affected sinuses. DIAGNOSIS  Your caregiver will perform a physical exam. During the exam, your caregiver may: Look in your nose for signs of abnormal growths in your nostrils (nasal polyps). Tap over the affected sinus to check for signs of infection. View the inside of your sinuses (endoscopy) with a special imaging device with a light attached (endoscope), which is inserted into your sinuses. If your caregiver suspects that you have chronic sinusitis, one or more of the following tests may be recommended: Allergy tests. Nasal culture A sample of mucus is taken from your nose and sent to a lab and screened for bacteria. Nasal cytology A sample of mucus is taken from your nose and examined by your caregiver to determine if your sinusitis is related to an allergy. TREATMENT  Most cases of acute sinusitis are related to a viral infection and will resolve on their own within 10 days. Sometimes medicines are prescribed to help relieve symptoms (pain medicine, decongestants, nasal steroid sprays, or saline sprays).  However, for sinusitis related to a bacterial infection, your caregiver will prescribe antibiotic medicines. These are medicines that will help kill the bacteria causing the infection.  Rarely, sinusitis is caused by a fungal infection. In theses cases, your caregiver will prescribe antifungal medicine. For some cases of chronic sinusitis, surgery is needed. Generally, these are cases in which sinusitis recurs more than 3 times per year, despite other treatments. HOME CARE INSTRUCTIONS  Drink plenty of water. Water helps thin the mucus so your sinuses can drain more easily. Use a humidifier. Inhale steam 3 to 4 times a day (for example, sit in the bathroom with the shower running). Apply a warm, moist washcloth to your face 3 to 4 times a day, or  as directed by your caregiver. Use saline nasal sprays to help moisten and clean your sinuses. Take over-the-counter or prescription medicines for pain, discomfort, or fever only as directed by your caregiver. SEEK IMMEDIATE MEDICAL CARE IF: You have increasing pain or severe headaches. You have nausea, vomiting, or drowsiness. You have swelling around your face. You have vision problems. You have a stiff neck. You have difficulty breathing. MAKE SURE YOU:  Understand these instructions. Will watch your condition. Will get help right away if you are not doing well or get worse. Document Released: 10/08/2005 Document Revised: 12/31/2011 Document Reviewed: 10/23/2011 [facility] Patient Information 2014 [place], [place].    If you have been instructed to have an in-person evaluation today at a local Urgent Care facility, please use the link below. It will take you to a list of all of our available [facility], including address, phone number and hours of operation. Please do not delay care.  [facility]  If you or a family member do not have a primary care provider, use the link below to schedule a visit and establish care. When you choose a [facility] primary care physician or advanced practice provider, you gain a long-term partner in health. Find a Primary Care  Provider  Learn more about [facility]'s in-office and virtual care options: [facility]

## 2022-09-04 ENCOUNTER — Encounter: Payer: Self-pay | Admitting: Family

## 2022-09-04 ENCOUNTER — Ambulatory Visit: Payer: BC Managed Care – PPO | Admitting: Family

## 2022-09-04 VITALS — BP 112/70 | HR 71 | Temp 98.6°F | Ht 64.0 in | Wt 191.8 lb

## 2022-09-04 DIAGNOSIS — N912 Amenorrhea, unspecified: Secondary | ICD-10-CM

## 2022-09-04 DIAGNOSIS — Z3201 Encounter for pregnancy test, result positive: Secondary | ICD-10-CM

## 2022-09-04 NOTE — Progress Notes (Signed)
Acute Office Visit  Subjective:     Patient ID: Sarah [name], female    DOB: November 16, 1990, 31 y.o.   MRN: [id number]  Chief Complaint  Patient presents with   Possible Pregnancy    Pt wants blood test to confirm pregnancy    Depression    PHQ9 - 5    HPI Patient is in today for pregnancy confirmation. She has taken 3 home urine pregnancy tests that were all positive. She has been trying for 7 months. Reports being excited.  Patient reports a history of anxiety. She is not currently on medications and sees a therapist periodically. She feels stable. No feelings of helplessness, hopelessness, no thoughts of death of dying.   Review of Systems  Psychiatric/Behavioral:  Negative for depression, substance abuse and suicidal ideas. The patient is nervous/anxious and has insomnia.   All other systems reviewed and are negative.  Past Medical History:  Diagnosis Date   Anxiety    Bell's palsy    Gall stones    Gestational hypertension 10/24/2019   Headache    migraines   Hypothyroidism    Ovarian cyst    Thyroid disease     Social History   Socioeconomic History   Marital status: Married    Spouse name: Not on file   Number of children: Not on file   Years of education: Not on file   Highest education level: Not on file  Occupational History   Not on file  Tobacco Use   Smoking status: Never   Smokeless tobacco: Never  Vaping Use   Vaping Use: Never used  Substance and Sexual Activity   Alcohol use: Not Currently    Comment: socially   Drug use: Never   Sexual activity: Yes    Birth control/protection: None  Other Topics Concern   Not on file  Social History Narrative   Not on file   Social Determinants of Health   Financial Resource Strain: Not on file  Food Insecurity: Not on file  Transportation Needs: Not on file  Physical Activity: Not on file  Stress: Not on file  Social Connections: Not on file  Intimate Partner Violence: Not on file    Past  Surgical History:  Procedure Laterality Date   CHOLECYSTECTOMY N/A 01/25/2020   Procedure: LAPAROSCOPIC CHOLECYSTECTOMY WITH INTRAOPERATIVE CHOLANGIOGRAM;  Surgeon: Chevis Pretty III, MD;  Location: [facility] OR;  Service: General;  Laterality: N/A;   LASIK  2017   NO PAST SURGERIES     wisdom tooth removal      Family History  Problem Relation Age of Onset   Hypertension Mother    Hashimoto's thyroiditis Mother    Cancer Maternal Grandmother        bone marrow cancer    Allergies  Allergen Reactions   Azithromycin     GI-- stomach cramps   Penicillins Hives    Did it involve swelling of the face/tongue/throat, SOB, or low BP? No Did it involve sudden or severe rash/hives, skin peeling, or any reaction on the inside of your mouth or nose? No Did you need to seek medical attention at a hospital or doctor's office? No When did it last happen?    3-4 years ago   If all above answers are "NO", may proceed with cephalosporin use.     Current Outpatient Medications on File Prior to Visit  Medication Sig Dispense Refill   azelastine (ASTELIN) 0.1 % nasal spray Place 2 sprays into both nostrils 2 (  two) times daily. Use in each nostril as directed 30 mL 12   fexofenadine (ALLEGRA) 180 MG tablet Take 180 mg by mouth daily.     fluticasone (FLONASE) 50 MCG/ACT nasal spray Place 2 sprays into both nostrils daily as needed for allergies or rhinitis.     montelukast (SINGULAIR) 10 MG tablet Take 1 tablet (10 mg total) by mouth at bedtime. 30 tablet 3   cefdinir (OMNICEF) 300 MG capsule Take 1 capsule (300 mg total) by mouth 2 (two) times daily. (Patient not taking: Reported on 09/04/2022) 20 capsule 0   thyroid (NP THYROID) 30 MG tablet Take 1 tablet (30 mg total) by mouth daily before breakfast. 90 tablet 1   No current facility-administered medications on file prior to visit.    BP 112/70   Pulse 71   Temp 98.6 F (37 C)   Ht 5\' 4"  (1.626 m)   Wt 191 lb 12.8 oz (87 kg)   SpO2 99%   BMI 32.92  kg/m chart      Objective:    BP 112/70   Pulse 71   Temp 98.6 F (37 C)   Ht 5\' 4"  (1.626 m)   Wt 191 lb 12.8 oz (87 kg)   SpO2 99%   BMI 32.92 kg/m    Physical Exam Vitals and nursing note reviewed.  Constitutional:      Appearance: Normal appearance.  Cardiovascular:     Rate and Rhythm: Normal rate and regular rhythm.     Pulses: Normal pulses.     Heart sounds: Normal heart sounds.  Pulmonary:     Effort: Pulmonary effort is normal.     Breath sounds: Normal breath sounds.  Musculoskeletal:        General: Normal range of motion.     Cervical back: Normal range of motion and neck supple.  Skin:    General: Skin is warm and dry.  Neurological:     General: No focal deficit present.     Mental Status: She is alert and oriented to person, place, and time.  Psychiatric:        Mood and Affect: Mood normal.        Behavior: Behavior normal.     No results found for any visits on 09/04/22.      Assessment & Plan:   Problem List Items Addressed This Visit   None Visit Diagnoses     Pregnancy confirmed by positive urine test    -  Primary   Amenorrhea       Relevant Orders   hCG, serum, qualitative      Patient will schedule an appointment with OB. Advised a prenatal vitamin with Folic Acid. Drink plenty of fluids. EDD is May 15, 2023.    No follow-ups on file.  Kennyth Arnold, FNP

## 2022-09-04 NOTE — Patient Instructions (Signed)
Congratulations!!! You estimated date of delivery is July 24th!

## 2022-09-05 LAB — HCG, SERUM, QUALITATIVE: Preg, Serum: POSITIVE — AB

## 2022-10-09 LAB — OB RESULTS CONSOLE GC/CHLAMYDIA
Chlamydia: NEGATIVE
Neisseria Gonorrhea: NEGATIVE

## 2022-10-09 LAB — OB RESULTS CONSOLE VARICELLA ZOSTER ANTIBODY, IGG: Varicella: IMMUNE

## 2022-10-09 LAB — OB RESULTS CONSOLE RUBELLA ANTIBODY, IGM: Rubella: IMMUNE

## 2022-10-09 LAB — HEPATITIS C ANTIBODY: HCV Ab: NEGATIVE

## 2022-10-09 LAB — OB RESULTS CONSOLE HEPATITIS B SURFACE ANTIGEN: Hepatitis B Surface Ag: NEGATIVE

## 2022-10-09 LAB — OB RESULTS CONSOLE RPR: RPR: NONREACTIVE

## 2022-10-22 NOTE — L&D Delivery Note (Signed)
DELIVERY NOTE  Pt complete and at +2 station with urge to push. Epidural controlling pain. Pt pushed and delivered a viable female infant in LOA position. Anterior and posterior shoulders spontaneously delivered with next two pushes; body easily followed next. Infant placed on mothers abdomen and bulb suction of mouth and nose performed. Cord was then clamped and cut by patient. Cord blood obtained, 3VC. Baby had a vigorous spontaneous cry noted. Placenta then delivered at 1244 intact. Fundal massage performed and pitocin per protocol. Fundus firm. The following lacerations were noted: 1st deg. Repaired in routine fashion with 2-0 vicryl. Mother and baby stable. Counts correct   Infant time: 1239 Gender: female, desires circ Placenta time: 1244 Apgars: 9/9 Weight: pending skin-to-skin

## 2022-12-10 ENCOUNTER — Other Ambulatory Visit: Payer: Self-pay

## 2022-12-10 DIAGNOSIS — E039 Hypothyroidism, unspecified: Secondary | ICD-10-CM

## 2022-12-10 MED ORDER — THYROID 30 MG PO TABS: 30.0000 mg | ORAL_TABLET | Freq: Every day | ORAL | 1 refills | Status: DC

## 2022-12-13 ENCOUNTER — Encounter (HOSPITAL_COMMUNITY): Payer: Self-pay

## 2022-12-13 ENCOUNTER — Inpatient Hospital Stay (HOSPITAL_COMMUNITY)
Admission: AD | Admit: 2022-12-13 | Discharge: 2022-12-13 | Disposition: A | Discharge status: Home / Self Care | Payer: BC Managed Care – PPO | Attending: Obstetrics and Gynecology | Admitting: Obstetrics and Gynecology

## 2022-12-13 DIAGNOSIS — O26899 Other specified pregnancy related conditions, unspecified trimester: Secondary | ICD-10-CM

## 2022-12-13 DIAGNOSIS — O26892 Other specified pregnancy related conditions, second trimester: Secondary | ICD-10-CM | POA: Insufficient documentation

## 2022-12-13 DIAGNOSIS — R110 Nausea: Secondary | ICD-10-CM | POA: Diagnosis not present

## 2022-12-13 DIAGNOSIS — O09292 Supervision of pregnancy with other poor reproductive or obstetric history, second trimester: Secondary | ICD-10-CM | POA: Diagnosis not present

## 2022-12-13 DIAGNOSIS — O99352 Diseases of the nervous system complicating pregnancy, second trimester: Secondary | ICD-10-CM | POA: Insufficient documentation

## 2022-12-13 DIAGNOSIS — O99282 Endocrine, nutritional and metabolic diseases complicating pregnancy, second trimester: Secondary | ICD-10-CM | POA: Diagnosis not present

## 2022-12-13 DIAGNOSIS — Z3A18 18 weeks gestation of pregnancy: Secondary | ICD-10-CM | POA: Diagnosis not present

## 2022-12-13 DIAGNOSIS — O219 Vomiting of pregnancy, unspecified: Secondary | ICD-10-CM | POA: Insufficient documentation

## 2022-12-13 LAB — URINALYSIS, ROUTINE W REFLEX MICROSCOPIC
Bilirubin Urine: NEGATIVE
Glucose, UA: NEGATIVE mg/dL
Hgb urine dipstick: NEGATIVE
Ketones, ur: 5 mg/dL — AB
Leukocytes,Ua: NEGATIVE
Nitrite: NEGATIVE
Protein, ur: NEGATIVE mg/dL
Specific Gravity, Urine: 1.026 (ref 1.005–1.030)
pH: 6 (ref 5.0–8.0)

## 2022-12-13 MED ORDER — PROMETHAZINE HCL 25 MG RE SUPP: 25.0000 mg | Freq: Four times a day (QID) | RECTAL | 0 refills | Status: DC | PRN

## 2022-12-13 MED ORDER — PROMETHAZINE HCL 25 MG PO TABS: 25.0000 mg | ORAL_TABLET | Freq: Four times a day (QID) | ORAL | 0 refills | Status: DC | PRN

## 2022-12-13 MED ORDER — ONDANSETRON HCL 8 MG PO TABS: 8.0000 mg | ORAL_TABLET | Freq: Four times a day (QID) | ORAL | 0 refills | Status: AC

## 2022-12-13 MED ORDER — METOCLOPRAMIDE HCL 10 MG PO TABS: 10.0000 mg | ORAL_TABLET | Freq: Four times a day (QID) | ORAL | 2 refills | Status: DC

## 2022-12-13 MED ORDER — PROCHLORPERAZINE EDISYLATE 10 MG/2ML IJ SOLN
10.0000 mg | Freq: Once | INTRAMUSCULAR | Status: AC
  Administered 2022-12-13: 10 mg via INTRAMUSCULAR
  Filled 2022-12-13: qty 2

## 2022-12-13 MED ORDER — OMEPRAZOLE MAGNESIUM 20 MG PO TBEC: 20.0000 mg | DELAYED_RELEASE_TABLET | Freq: Every day | ORAL | 3 refills | Status: AC

## 2022-12-13 MED ORDER — SCOPOLAMINE 1 MG/3DAYS TD PT72: 1.0000 | MEDICATED_PATCH | TRANSDERMAL | 12 refills | Status: DC

## 2022-12-13 MED ORDER — SCOPOLAMINE 1 MG/3DAYS TD PT72
1.0000 | MEDICATED_PATCH | TRANSDERMAL | Status: DC
  Administered 2022-12-13: 1.5 mg via TRANSDERMAL
  Filled 2022-12-13: qty 1

## 2022-12-13 NOTE — MAU Provider Note (Signed)
History     CSN: [id number]  Arrival date and time: 12/13/22 [id number]   Event Date/Time   First Provider Initiated Contact with Patient 12/13/22 [phone]      Chief Complaint  Patient presents with   Nausea   Emesis   HPI  Sarah [name] is a 32 y.o. G2P1001 at 61w1dwho presents for evaluation of nausea. Patient reports she has struggled with nausea the entire pregnancy. She states she only vomits once at night but feels nauseous throughout the day. She tried zofran and reglan this am without relief. She also reports more frequent headaches over the last 2-3 weeks. She reports they resolve with rest and ice. She reports they are migraines but does not have a neurologist and has not been diagnose with migraines before. She denies any headache at this time.  She denies any vaginal bleeding, discharge, and leaking of fluid. Denies any constipation, diarrhea or any urinary complaints. Reports normal fetal movement.   OB History     Gravida  2   Para  1   Term  1   Preterm      AB      Living  1      SAB      IAB      Ectopic      Multiple  0   Live Births  1           Past Medical History:  Diagnosis Date   Anxiety    Bell's palsy    Gall stones    Gestational hypertension 10/24/2019   Headache    migraines   Hypothyroidism    Ovarian cyst    Thyroid disease     Past Surgical History:  Procedure Laterality Date   CHOLECYSTECTOMY N/A 01/25/2020   Procedure: LAPAROSCOPIC CHOLECYSTECTOMY WITH INTRAOPERATIVE CHOLANGIOGRAM;  Surgeon: TJovita Kussmaul MD;  Location: [facility] OR;  Service: General;  Laterality: N/A;   LASIK  2017   NO PAST SURGERIES     wisdom tooth removal      Family History  Problem Relation Age of Onset   Hypertension Mother    Hashimoto's thyroiditis Mother    Hypertension Father    Cancer Maternal Grandmother        bone marrow cancer    Social History   Tobacco Use   Smoking status: Never   Smokeless tobacco: Never  Vaping Use    Vaping Use: Never used  Substance Use Topics   Alcohol use: Not Currently    Comment: socially   Drug use: Never    Allergies:  Allergies  Allergen Reactions   Azithromycin     GI-- stomach cramps   Penicillins Hives    Did it involve swelling of the face/tongue/throat, SOB, or low BP? No Did it involve sudden or severe rash/hives, skin peeling, or any reaction on the inside of your mouth or nose? No Did you need to seek medical attention at a hospital or doctor's office? No When did it last happen?    3-4 years ago   If all above answers are "NO", may proceed with cephalosporin use.     No medications prior to admission.    Review of Systems  Constitutional: Negative.  Negative for fatigue and fever.  HENT: Negative.    Respiratory: Negative.  Negative for shortness of breath.   Cardiovascular: Negative.  Negative for chest pain.  Gastrointestinal:  Positive for nausea. Negative for abdominal pain, constipation, diarrhea and vomiting.  Genitourinary:  Negative.  Negative for dysuria, vaginal bleeding and vaginal discharge.  Neurological: Negative.  Negative for dizziness and headaches.   Physical Exam   Blood pressure 110/62, pulse 66, temperature 98 F (36.7 C), temperature source Oral, resp. rate 16, height 5' 4"$  (1.626 m), weight 87.9 kg, last menstrual period 04/27/2022, SpO2 100 %.  Patient Vitals for the past 24 hrs:  BP Temp Temp src Pulse Resp SpO2 Height Weight  12/13/22 1054 110/62 -- -- 66 -- -- -- --  12/13/22 0917 122/66 -- -- 80 -- -- -- --  12/13/22 0911 124/65 98 F (36.7 C) Oral 84 16 100 % 5' 4"$  (1.626 m) 87.9 kg    Physical Exam Vitals and nursing note reviewed.  Constitutional:      General: She is not in acute distress.    Appearance: She is well-developed.  HENT:     Head: Normocephalic.  Eyes:     Pupils: Pupils are equal, round, and reactive to light.  Cardiovascular:     Rate and Rhythm: Normal rate and regular rhythm.     Heart  sounds: Normal heart sounds.  Pulmonary:     Effort: Pulmonary effort is normal. No respiratory distress.     Breath sounds: Normal breath sounds.  Abdominal:     General: Bowel sounds are normal. There is no distension.     Palpations: Abdomen is soft.     Tenderness: There is no abdominal tenderness.  Skin:    General: Skin is warm and dry.  Neurological:     Mental Status: She is alert and oriented to person, place, and time.  Psychiatric:        Mood and Affect: Mood normal.        Behavior: Behavior normal.        Thought Content: Thought content normal.        Judgment: Judgment normal.     FHT: 155 bpm   MAU Course  Procedures  Results for orders placed or performed during the hospital encounter of 12/13/22 (from the past 24 hour(s))  Urinalysis, Routine w reflex microscopic -Urine, Clean Catch     Status: Abnormal   Collection Time: 12/13/22  9:14 AM  Result Value Ref Range   Color, Urine YELLOW YELLOW   APPearance HAZY (A) CLEAR   Specific Gravity, Urine 1.026 1.005 - 1.030   pH 6.0 5.0 - 8.0   Glucose, UA NEGATIVE NEGATIVE mg/dL   Hgb urine dipstick NEGATIVE NEGATIVE   Bilirubin Urine NEGATIVE NEGATIVE   Ketones, ur 5 (A) NEGATIVE mg/dL   Protein, ur NEGATIVE NEGATIVE mg/dL   Nitrite NEGATIVE NEGATIVE   Leukocytes,Ua NEGATIVE NEGATIVE   RBC / HPF 0-5 0 - 5 RBC/hpf   WBC, UA 0-5 0 - 5 WBC/hpf   Bacteria, UA RARE (A) NONE SEEN   Squamous Epithelial / HPF 0-5 0 - 5 /HPF   Mucus PRESENT       MDM Prenatal records from community office reviewed.  Labs ordered and reviewed.   UA Scop patch Compazine  Patient reports she is still feeling nauseous. Offered phenergan and patient declines due to concern for drowsiness.   Assessment and Plan   1. Pregnancy related nausea, antepartum   2. [redacted] weeks gestation of pregnancy     -Discharge home in stable condition -Rx for antiemetics sent to pharmacy -Second trimester precautions discussed -Patient advised  to follow-up with OB as scheduled for prenatal care -Patient may return to MAU as needed or if  her condition were to change or worsen  Wende Mott, [place] 12/13/2022, 1:43 PM

## 2022-12-13 NOTE — MAU Note (Signed)
RN called lab to inquire about UA status. Terrance, lab tech, answered the phone and informed RN that the UA was already done processing, but that he believes that they may not be transferring over from [facility] is faxing up the results now.

## 2022-12-13 NOTE — MAU Note (Signed)
.  Sarah [name] is a 32 y.o. at 10w1dhere in MAU reporting: N/V that has been on-going during her pregnancy. She reports one episode of emesis in the past 24 hours and continued nausea. She last took Zofran and Reglan at 0700 and did not get relief with those medications. Did feel a little cramping this morning that went away when she got up. She also reports on-going migraines, but denies any HA today. Denies VB or LOF.   LMP: N/A Onset of complaint: ON-going Pain score: Denies at this time Vitals:   12/13/22 0911 12/13/22 0917  BP: 124/65 122/66  Pulse: 84 80  Resp: 16   Temp: 98 F (36.7 C)   SpO2: 100%      FHT:155 Lab orders placed from triage:  UA

## 2022-12-13 NOTE — Discharge Instructions (Signed)

## 2023-01-16 ENCOUNTER — Telehealth: Payer: BC Managed Care – PPO | Admitting: Physician Assistant

## 2023-01-16 ENCOUNTER — Ambulatory Visit: Payer: BC Managed Care – PPO | Admitting: Family Medicine

## 2023-01-16 DIAGNOSIS — B9789 Other viral agents as the cause of diseases classified elsewhere: Secondary | ICD-10-CM | POA: Diagnosis not present

## 2023-01-16 DIAGNOSIS — J019 Acute sinusitis, unspecified: Secondary | ICD-10-CM

## 2023-01-16 MED ORDER — CEFDINIR 300 MG PO CAPS: 300.0000 mg | ORAL_CAPSULE | Freq: Two times a day (BID) | ORAL | 0 refills | Status: DC

## 2023-01-16 NOTE — Patient Instructions (Signed)
Sarah [name], thank you for joining Leeanne Rio, PA-C for today's virtual visit.  While this provider is not your primary care provider (PCP), if your PCP is located in our provider database this encounter information will be shared with them immediately following your visit.   [place] account gives you access to today's visit and all your visits, tests, and labs performed at [facility] " click here if you don't have a [facility] account or go to [email or web address]  Consent: (Patient) Sarah [name] provided verbal consent for this virtual visit at the beginning of the encounter.  Current Medications:  Current Outpatient Medications:    azelastine (ASTELIN) 0.1 % nasal spray, Place 2 sprays into both nostrils 2 (two) times daily. Use in each nostril as directed, Disp: 30 mL, Rfl: 12   fexofenadine (ALLEGRA) 180 MG tablet, Take 180 mg by mouth daily., Disp: , Rfl:    fluticasone (FLONASE) 50 MCG/ACT nasal spray, Place 2 sprays into both nostrils daily as needed for allergies or rhinitis., Disp: , Rfl:    metoCLOPramide (REGLAN) 10 MG tablet, Take 1 tablet (10 mg total) by mouth 4 (four) times daily., Disp: 30 tablet, Rfl: 2   montelukast (SINGULAIR) 10 MG tablet, Take 1 tablet (10 mg total) by mouth at bedtime., Disp: 30 tablet, Rfl: 3   omeprazole (PRILOSEC OTC) 20 MG tablet, Take 1 tablet (20 mg total) by mouth daily., Disp: 30 tablet, Rfl: 3   promethazine (PHENERGAN) 25 MG suppository, Place 1 suppository (25 mg total) rectally every 6 (six) hours as needed for nausea or vomiting., Disp: 12 each, Rfl: 0   promethazine (PHENERGAN) 25 MG tablet, Take 1 tablet (25 mg total) by mouth every 6 (six) hours as needed for nausea or vomiting., Disp: 30 tablet, Rfl: 0   scopolamine (TRANSDERM-SCOP) 1 MG/3DAYS, Place 1 patch (1.5 mg total) onto the skin every 3 (three) days., Disp: 10 patch, Rfl: 12   thyroid (NP THYROID) 30 MG tablet, Take 1  tablet (30 mg total) by mouth daily before breakfast., Disp: 90 tablet, Rfl: 1   Medications ordered in this encounter:  No orders of the defined types were placed in this encounter.    *If you need refills on other medications prior to your next appointment, please contact your pharmacy*  Follow-Up: Call back or seek an in-person evaluation if the symptoms worsen or if the condition fails to improve as anticipated.  [place] [phone]  Other Instructions Please take antibiotic as directed.  Increase fluid intake.  Use Saline nasal spray.  Take a daily multivitamin.  Okay to continue your Claritin, but you may want to switch to taking a Claritin-D along with some over-the-counter plain Mucinex..  Place a humidifier in the bedroom.  Please call or return clinic if symptoms are not improving.  Sinusitis Sinusitis is redness, soreness, and swelling (inflammation) of the paranasal sinuses. Paranasal sinuses are air pockets within the bones of your face (beneath the eyes, the middle of the forehead, or above the eyes). In healthy paranasal sinuses, mucus is able to drain out, and air is able to circulate through them by way of your nose. However, when your paranasal sinuses are inflamed, mucus and air can become trapped. This can allow bacteria and other germs to grow and cause infection. Sinusitis can develop quickly and last only a short time (acute) or continue over a long period (chronic). Sinusitis that lasts for more than 12 weeks is considered  chronic.  CAUSES  Causes of sinusitis include: Allergies. Structural abnormalities, such as displacement of the cartilage that separates your nostrils (deviated septum), which can decrease the air flow through your nose and sinuses and affect sinus drainage. Functional abnormalities, such as when the small hairs (cilia) that line your sinuses and help remove mucus do not work properly or are not present. SYMPTOMS  Symptoms of  acute and chronic sinusitis are the same. The primary symptoms are pain and pressure around the affected sinuses. Other symptoms include: Upper toothache. Earache. Headache. Bad breath. Decreased sense of smell and taste. A cough, which worsens when you are lying flat. Fatigue. Fever. Thick drainage from your nose, which often is green and may contain pus (purulent). Swelling and warmth over the affected sinuses. DIAGNOSIS  Your caregiver will perform a physical exam. During the exam, your caregiver may: Look in your nose for signs of abnormal growths in your nostrils (nasal polyps). Tap over the affected sinus to check for signs of infection. View the inside of your sinuses (endoscopy) with a special imaging device with a light attached (endoscope), which is inserted into your sinuses. If your caregiver suspects that you have chronic sinusitis, one or more of the following tests may be recommended: Allergy tests. Nasal culture A sample of mucus is taken from your nose and sent to a lab and screened for bacteria. Nasal cytology A sample of mucus is taken from your nose and examined by your caregiver to determine if your sinusitis is related to an allergy. TREATMENT  Most cases of acute sinusitis are related to a viral infection and will resolve on their own within 10 days. Sometimes medicines are prescribed to help relieve symptoms (pain medicine, decongestants, nasal steroid sprays, or saline sprays).  However, for sinusitis related to a bacterial infection, your caregiver will prescribe antibiotic medicines. These are medicines that will help kill the bacteria causing the infection.  Rarely, sinusitis is caused by a fungal infection. In theses cases, your caregiver will prescribe antifungal medicine. For some cases of chronic sinusitis, surgery is needed. Generally, these are cases in which sinusitis recurs more than 3 times per year, despite other treatments. HOME CARE INSTRUCTIONS   Drink plenty of water. Water helps thin the mucus so your sinuses can drain more easily. Use a humidifier. Inhale steam 3 to 4 times a day (for example, sit in the bathroom with the shower running). Apply a warm, moist washcloth to your face 3 to 4 times a day, or as directed by your caregiver. Use saline nasal sprays to help moisten and clean your sinuses. Take over-the-counter or prescription medicines for pain, discomfort, or fever only as directed by your caregiver. SEEK IMMEDIATE MEDICAL CARE IF: You have increasing pain or severe headaches. You have nausea, vomiting, or drowsiness. You have swelling around your face. You have vision problems. You have a stiff neck. You have difficulty breathing. MAKE SURE YOU:  Understand these instructions. Will watch your condition. Will get help right away if you are not doing well or get worse. Document Released: 10/08/2005 Document Revised: 12/31/2011 Document Reviewed: 10/23/2011 [facility] Patient Information 2014 [place], [place].    If you have been instructed to have an in-person evaluation today at a local Urgent Care facility, please use the link below. It will take you to a list of all of our available [facility], including address, phone number and hours of operation. Please do not delay care.  [facility]  If  you or a family member do not have a primary care provider, use the link below to schedule a visit and establish care. When you choose a [facility] primary care physician or advanced practice provider, you gain a long-term partner in health. Find a Primary Care Provider  Learn more about [facility] in-office and virtual care options: [facility]

## 2023-01-16 NOTE — Progress Notes (Signed)
Virtual Visit Consent   Sarah [name], you are scheduled for a virtual visit with a [facility] provider today. Just as with appointments in the office, your consent must be obtained to participate. Your consent will be active for this visit and any virtual visit you may have with one of our providers in the next 365 days. If you have a MyChart account, a copy of this consent [name] be sent to you electronically.  As this is a virtual visit, video technology does not allow for your provider to perform a traditional examination. This may limit your provider's ability to fully assess your condition. If your provider identifies any concerns that need to be evaluated in person or the need to arrange testing (such as labs, EKG, etc.), we will make arrangements to do so. Although advances in technology are sophisticated, we cannot ensure that it will always work on either your end or our end. If the connection with a video visit is poor, the visit may have to be switched to a telephone visit. With either a video or telephone visit, we are not always able to ensure that we have a secure connection.  By engaging in this virtual visit, you consent to the provision of healthcare and authorize for your insurance to be billed (if applicable) for the services provided during this visit. Depending on your insurance coverage, you may receive a charge related to this service.  I need to obtain your verbal consent now. Are you willing to proceed with your visit today? Sarah [name] has provided verbal consent on 01/16/2023 for a virtual visit (video or telephone). Leeanne Rio, [place]  Date: 01/16/2023 9:53 AM  Virtual Visit via Video Note   I, Leeanne Rio, connected with  Sarah [name]  ([id number], 1991/01/01) on 01/16/23 at  9:30 AM EDT by a video-enabled telemedicine application and verified that I am speaking with the correct person using two identifiers.  Location: Patient: Virtual Visit  Location Patient: Home Provider: Virtual Visit Location Provider: Home Office   I discussed the limitations of evaluation and management by telemedicine and the availability of in person appointments. The patient expressed understanding and agreed to proceed.    History of Present Illness: Sarah [name] is a 32 y.o. who identifies as a female who was assigned female at birth, and is being seen today for URI symptoms starting around 3 days ago with scratchy throat, nasal and head congestion. Some cough at night time -- mainly dry. Noting some bilateral ear pressure and pain with headache. Denies fever, chills.   OTC -- Claritin, Acetaminophen  HPI: HPI  Problems:  Patient Active Problem List   Diagnosis Date Noted   Anxiety and depression 11/09/2021   Gallstones 07/24/2019   Hypothyroidism 03/31/2019    Allergies:  Allergies  Allergen Reactions   Azithromycin     GI-- stomach cramps   Penicillins Hives    Did it involve swelling of the face/tongue/throat, SOB, or low BP? No Did it involve sudden or severe rash/hives, skin peeling, or any reaction on the inside of your mouth or nose? No Did you need to seek medical attention at a hospital or doctor's office? No When did it last happen?    3-4 years ago   If all above answers are "NO", may proceed with cephalosporin use.    Medications:  Current Outpatient Medications:    cefdinir (OMNICEF) 300 MG capsule, Take 1 capsule (300 mg total) by mouth 2 (two) times daily., Disp: 20  capsule, Rfl: 0   ondansetron (ZOFRAN-ODT) 8 MG disintegrating tablet, Take 8 mg by mouth 2 (two) times daily as needed., Disp: , Rfl:    loratadine (CLARITIN) 10 MG tablet, , Disp: , Rfl:    omeprazole (PRILOSEC OTC) 20 MG tablet, Take 1 tablet (20 mg total) by mouth daily., Disp: 30 tablet, Rfl: 3   thyroid (NP THYROID) 30 MG tablet, Take 1 tablet (30 mg total) by mouth daily before breakfast., Disp: 90 tablet, Rfl: 1  Observations/Objective: Patient is  well-developed, well-nourished in no acute distress.  Resting comfortably at home.  Head is normocephalic, atraumatic.  No labored breathing. Speech is clear and coherent with logical content.  Patient is alert and oriented at baseline.   Assessment and Plan: 1. Acute viral sinusitis  Discussed that symptoms seem most likely viral in nature, especially with her daughter having similar symptoms.  No other symptoms at present concerning for a secondary ear infection, but some eustachian tube dysfunction suspected.  Supportive measures and OTC medications safe for pregnancy discussed.  Okay to continue her Claritin as directed.  She has a substantial history of bacterial sinusitis and is concerned for this progressing.  Will place prescription for cefdinir on file to take as directed if symptoms or not starting to turn the corner over the next few days.    Follow Up Instructions: I discussed the assessment and treatment plan with the patient. The patient was provided an opportunity to ask questions and all were answered. The patient agreed with the plan and demonstrated an understanding of the instructions.  A copy of instructions were sent to the patient via MyChart unless otherwise noted below.   The patient was advised to call back or seek an in-person evaluation if the symptoms worsen or if the condition fails to improve as anticipated.  Time:  I spent 10 minutes with the patient via telehealth technology discussing the above problems/concerns.    Leeanne Rio, PA-C

## 2023-02-19 LAB — OB RESULTS CONSOLE HIV ANTIBODY (ROUTINE TESTING): HIV: NONREACTIVE

## 2023-03-22 ENCOUNTER — Other Ambulatory Visit: Payer: Self-pay

## 2023-05-02 ENCOUNTER — Inpatient Hospital Stay (HOSPITAL_COMMUNITY): Payer: BC Managed Care – PPO | Admitting: Anesthesiology

## 2023-05-02 ENCOUNTER — Encounter (HOSPITAL_COMMUNITY): Payer: Self-pay | Admitting: Obstetrics and Gynecology

## 2023-05-02 ENCOUNTER — Other Ambulatory Visit: Payer: Self-pay

## 2023-05-02 ENCOUNTER — Inpatient Hospital Stay (HOSPITAL_COMMUNITY)
Admission: AD | Admit: 2023-05-02 | Discharge: 2023-05-03 | DRG: 807 | Disposition: A | Discharge status: Home / Self Care | Payer: BC Managed Care – PPO | Attending: Obstetrics and Gynecology | Admitting: Obstetrics and Gynecology

## 2023-05-02 DIAGNOSIS — E039 Hypothyroidism, unspecified: Secondary | ICD-10-CM | POA: Diagnosis present

## 2023-05-02 DIAGNOSIS — Z3A38 38 weeks gestation of pregnancy: Secondary | ICD-10-CM | POA: Diagnosis not present

## 2023-05-02 DIAGNOSIS — O26893 Other specified pregnancy related conditions, third trimester: Secondary | ICD-10-CM | POA: Diagnosis present

## 2023-05-02 DIAGNOSIS — O99284 Endocrine, nutritional and metabolic diseases complicating childbirth: Secondary | ICD-10-CM | POA: Diagnosis present

## 2023-05-02 LAB — CBC
HCT: 30.7 % — ABNORMAL LOW (ref 36.0–46.0)
Hemoglobin: 9.8 g/dL — ABNORMAL LOW (ref 12.0–15.0)
MCH: 24.6 pg — ABNORMAL LOW (ref 26.0–34.0)
MCHC: 31.9 g/dL (ref 30.0–36.0)
MCV: 77.1 fL — ABNORMAL LOW (ref 80.0–100.0)
Platelets: 248 10*3/uL (ref 150–400)
RBC: 3.98 MIL/uL (ref 3.87–5.11)
RDW: 12.9 % (ref 11.5–15.5)
WBC: 8.8 10*3/uL (ref 4.0–10.5)
nRBC: 0 % (ref 0.0–0.2)

## 2023-05-02 LAB — TYPE AND SCREEN
ABO/RH(D): O POS
Antibody Screen: NEGATIVE

## 2023-05-02 LAB — RPR: RPR Ser Ql: NONREACTIVE

## 2023-05-02 LAB — HIV ANTIBODY (ROUTINE TESTING W REFLEX): HIV Screen 4th Generation wRfx: NONREACTIVE

## 2023-05-02 LAB — POCT FERN TEST: POCT Fern Test: POSITIVE

## 2023-05-02 LAB — OB RESULTS CONSOLE GBS: GBS: NEGATIVE

## 2023-05-02 MED ORDER — OXYTOCIN BOLUS FROM INFUSION
333.0000 mL | Freq: Once | INTRAVENOUS | Status: AC
  Administered 2023-05-02: 333 mL via INTRAVENOUS

## 2023-05-02 MED ORDER — LACTATED RINGERS IV SOLN: 500.0000 mL | Freq: Once | INTRAVENOUS | Status: DC

## 2023-05-02 MED ORDER — ONDANSETRON HCL 4 MG/2ML IJ SOLN: 4.0000 mg | INTRAMUSCULAR | Status: DC | PRN

## 2023-05-02 MED ORDER — IBUPROFEN 600 MG PO TABS
600.0000 mg | ORAL_TABLET | Freq: Four times a day (QID) | ORAL | Status: DC
  Administered 2023-05-02 – 2023-05-03 (×3): 600 mg via ORAL
  Filled 2023-05-02 (×3): qty 1

## 2023-05-02 MED ORDER — OXYTOCIN-SODIUM CHLORIDE 30-0.9 UT/500ML-% IV SOLN
2.5000 [IU]/h | INTRAVENOUS | Status: DC
  Filled 2023-05-02 (×2): qty 500

## 2023-05-02 MED ORDER — ZOLPIDEM TARTRATE 5 MG PO TABS: 5.0000 mg | ORAL_TABLET | Freq: Every evening | ORAL | Status: DC | PRN

## 2023-05-02 MED ORDER — PRENATAL MULTIVITAMIN CH: 1.0000 | ORAL_TABLET | Freq: Every day | ORAL | Status: DC

## 2023-05-02 MED ORDER — LACTATED RINGERS IV SOLN: 500.0000 mL | INTRAVENOUS | Status: DC | PRN

## 2023-05-02 MED ORDER — DIPHENHYDRAMINE HCL 25 MG PO CAPS: 25.0000 mg | ORAL_CAPSULE | Freq: Four times a day (QID) | ORAL | Status: DC | PRN

## 2023-05-02 MED ORDER — OXYCODONE-ACETAMINOPHEN 5-325 MG PO TABS: 2.0000 | ORAL_TABLET | ORAL | Status: DC | PRN

## 2023-05-02 MED ORDER — DIBUCAINE (PERIANAL) 1 % EX OINT: 1.0000 | TOPICAL_OINTMENT | CUTANEOUS | Status: DC | PRN

## 2023-05-02 MED ORDER — ONDANSETRON HCL 4 MG/2ML IJ SOLN
4.0000 mg | Freq: Four times a day (QID) | INTRAMUSCULAR | Status: DC | PRN
  Filled 2023-05-02: qty 2

## 2023-05-02 MED ORDER — FENTANYL-BUPIVACAINE-NACL 0.5-0.125-0.9 MG/250ML-% EP SOLN
12.0000 mL/h | EPIDURAL | Status: DC | PRN
  Administered 2023-05-02: 12 mL/h via EPIDURAL
  Filled 2023-05-02: qty 250

## 2023-05-02 MED ORDER — COCONUT OIL OIL: 1.0000 | TOPICAL_OIL | Status: DC | PRN

## 2023-05-02 MED ORDER — ONDANSETRON HCL 4 MG PO TABS: 4.0000 mg | ORAL_TABLET | ORAL | Status: DC | PRN

## 2023-05-02 MED ORDER — OXYCODONE-ACETAMINOPHEN 5-325 MG PO TABS: 1.0000 | ORAL_TABLET | ORAL | Status: DC | PRN

## 2023-05-02 MED ORDER — ACETAMINOPHEN 325 MG PO TABS
650.0000 mg | ORAL_TABLET | ORAL | Status: DC | PRN
  Administered 2023-05-02 – 2023-05-03 (×3): 650 mg via ORAL
  Filled 2023-05-02 (×3): qty 2

## 2023-05-02 MED ORDER — SIMETHICONE 80 MG PO CHEW: 80.0000 mg | CHEWABLE_TABLET | ORAL | Status: DC | PRN

## 2023-05-02 MED ORDER — FLEET ENEMA 7-19 GM/118ML RE ENEM: 1.0000 | ENEMA | RECTAL | Status: DC | PRN

## 2023-05-02 MED ORDER — FENTANYL CITRATE (PF) 100 MCG/2ML IJ SOLN
50.0000 ug | INTRAMUSCULAR | Status: DC | PRN
  Administered 2023-05-02: 100 ug via INTRAVENOUS
  Administered 2023-05-02: 50 ug via INTRAVENOUS
  Filled 2023-05-02 (×2): qty 2

## 2023-05-02 MED ORDER — THYROID 30 MG PO TABS
30.0000 mg | ORAL_TABLET | Freq: Every day | ORAL | Status: DC
  Administered 2023-05-02 – 2023-05-03 (×2): 30 mg via ORAL
  Filled 2023-05-02 (×3): qty 1

## 2023-05-02 MED ORDER — EPHEDRINE 5 MG/ML INJ: 10.0000 mg | INTRAVENOUS | Status: DC | PRN

## 2023-05-02 MED ORDER — SENNOSIDES-DOCUSATE SODIUM 8.6-50 MG PO TABS
2.0000 | ORAL_TABLET | Freq: Every day | ORAL | Status: DC
  Administered 2023-05-03: 2 via ORAL
  Filled 2023-05-02: qty 2

## 2023-05-02 MED ORDER — SOD CITRATE-CITRIC ACID 500-334 MG/5ML PO SOLN: 30.0000 mL | ORAL | Status: DC | PRN

## 2023-05-02 MED ORDER — WITCH HAZEL-GLYCERIN EX PADS: 1.0000 | MEDICATED_PAD | CUTANEOUS | Status: DC | PRN

## 2023-05-02 MED ORDER — LIDOCAINE HCL (PF) 1 % IJ SOLN
INTRAMUSCULAR | Status: DC | PRN
  Administered 2023-05-02: 8 mL via EPIDURAL

## 2023-05-02 MED ORDER — FENTANYL-BUPIVACAINE-NACL 0.5-0.125-0.9 MG/250ML-% EP SOLN: 12.0000 mL/h | EPIDURAL | Status: DC | PRN

## 2023-05-02 MED ORDER — PHENYLEPHRINE 80 MCG/ML (10ML) SYRINGE FOR IV PUSH (FOR BLOOD PRESSURE SUPPORT): 80.0000 ug | PREFILLED_SYRINGE | INTRAVENOUS | Status: DC | PRN

## 2023-05-02 MED ORDER — TETANUS-DIPHTH-ACELL PERTUSSIS 5-2.5-18.5 LF-MCG/0.5 IM SUSY: 0.5000 mL | PREFILLED_SYRINGE | Freq: Once | INTRAMUSCULAR | Status: DC

## 2023-05-02 MED ORDER — DIPHENHYDRAMINE HCL 50 MG/ML IJ SOLN: 12.5000 mg | INTRAMUSCULAR | Status: DC | PRN

## 2023-05-02 MED ORDER — ACETAMINOPHEN 325 MG PO TABS: 650.0000 mg | ORAL_TABLET | ORAL | Status: DC | PRN

## 2023-05-02 MED ORDER — THYROID 30 MG PO TABS: 30.0000 mg | ORAL_TABLET | Freq: Every day | ORAL | Status: DC

## 2023-05-02 MED ORDER — LACTATED RINGERS IV SOLN: INTRAVENOUS | Status: DC

## 2023-05-02 MED ORDER — LIDOCAINE HCL (PF) 1 % IJ SOLN: 30.0000 mL | INTRAMUSCULAR | Status: DC | PRN

## 2023-05-02 MED ORDER — BENZOCAINE-MENTHOL 20-0.5 % EX AERO
1.0000 | INHALATION_SPRAY | CUTANEOUS | Status: DC | PRN
  Administered 2023-05-02: 1 via TOPICAL
  Filled 2023-05-02: qty 56

## 2023-05-02 NOTE — Anesthesia Procedure Notes (Signed)
Epidural Patient location during procedure: OB Start time: 05/02/2023 7:44 AM End time: 05/02/2023 7:50 AM  Staffing Anesthesiologist: Bethena Midget, MD  Preanesthetic Checklist Completed: patient identified, IV checked, site marked, risks and benefits discussed, surgical consent, monitors and equipment checked, pre-op evaluation and timeout performed  Epidural Patient position: sitting Prep: DuraPrep and site prepped and draped Patient monitoring: continuous pulse ox and blood pressure Approach: midline Location: L3-L4 Injection technique: LOR air  Needle:  Needle type: Tuohy  Needle gauge: 17 G Needle length: 9 cm and 9 Needle insertion depth: 7 cm Catheter type: closed end flexible Catheter size: 19 Gauge Catheter at skin depth: 13 cm Test dose: negative  Assessment Events: blood not aspirated, no cerebrospinal fluid, injection not painful, no injection resistance, no paresthesia and negative IV test

## 2023-05-02 NOTE — Anesthesia Postprocedure Evaluation (Signed)
Anesthesia Post Note  Patient: Sarah [name]  Procedure(s) Performed: AN AD HOC LABOR EPIDURAL     Patient location during evaluation: Mother Baby Anesthesia Type: Epidural Level of consciousness: awake and alert Pain management: pain level controlled Vital Signs Assessment: post-procedure vital signs reviewed and stable Respiratory status: spontaneous breathing, nonlabored ventilation and respiratory function stable Cardiovascular status: stable Postop Assessment: no headache, no backache and epidural receding Anesthetic complications: no   No notable events documented.  Last Vitals:  Vitals:   05/02/23 1346 05/02/23 1401  BP: (!) 122/54 119/66  Pulse: 96 97  Resp:    Temp:  37.7 C  SpO2:      Last Pain:  Vitals:   05/02/23 1401  TempSrc: Oral  PainSc:    Pain Goal:                   Sullivan Blasing

## 2023-05-02 NOTE — Progress Notes (Signed)
Patient feeling intermittent pressure BP 95/60   Pulse (!) 157   Temp 99.7 F (37.6 C) (Oral)   Resp 16   Ht 5\' 4"  (1.626 m)   Wt 93 kg   LMP 04/27/2022 (Exact Date)   SpO2 99%   BMI 35.19 kg/m  CE complete/+1, good practice push  Will set up table and begin Cat 1 tracing + early decels, min to mod var, TOCO q35m  Anticipate SVD

## 2023-05-02 NOTE — Anesthesia Preprocedure Evaluation (Signed)
Anesthesia Evaluation  Patient identified by MRN, date of birth, ID band Patient awake    Reviewed: Allergy & Precautions, H&P , NPO status , Patient's Chart, lab work & pertinent test results, reviewed documented beta blocker date and time   Airway Mallampati: II  TM Distance: >3 FB Neck ROM: full    Dental no notable dental hx.    Pulmonary neg pulmonary ROS   Pulmonary exam normal breath sounds clear to auscultation       Cardiovascular hypertension, negative cardio ROS Normal cardiovascular exam Rhythm:regular Rate:Normal     Neuro/Psych  Headaches PSYCHIATRIC DISORDERS Anxiety Depression     Neuromuscular disease negative neurological ROS  negative psych ROS   GI/Hepatic negative GI ROS, Neg liver ROS,,,  Endo/Other  negative endocrine ROSHypothyroidism    Renal/GU negative Renal ROS  negative genitourinary   Musculoskeletal   Abdominal   Peds  Hematology negative hematology ROS (+)   Anesthesia Other Findings   Reproductive/Obstetrics (+) Pregnancy                             Anesthesia Physical Anesthesia Plan  ASA: 3  Anesthesia Plan: Epidural   Post-op Pain Management: Minimal or no pain anticipated   Induction: Intravenous  PONV Risk Score and Plan: 2 and Treatment may vary due to age or medical condition  Airway Management Planned: Natural Airway and Simple Face Mask  Additional Equipment: None  Intra-op Plan:   Post-operative Plan:   Informed Consent: I have reviewed the patients History and Physical, chart, labs and discussed the procedure including the risks, benefits and alternatives for the proposed anesthesia with the patient or authorized representative who has indicated his/her understanding and acceptance.       Plan Discussed with: Anesthesiologist and CRNA  Anesthesia Plan Comments:        Anesthesia Quick Evaluation

## 2023-05-02 NOTE — H&P (Signed)
Davetta Cumbo is a 32 y.o. female presenting for evaluation of ROM. +FM, denies VB. SROM 0100 clear fluid, some cramping  PNC c/b 1) Hypothyroidism - NP Thyroid 30 every day 2) H/o PreE - baby Asa 3) +EIF - NIPT low risk  GBS neg  OB History     Gravida  2   Para  1   Term  1   Preterm      AB      Living  1      SAB      IAB      Ectopic      Multiple  0   Live Births  1          Past Medical History:  Diagnosis Date   Anxiety    Bell's palsy    Gall stones    Gestational hypertension 10/24/2019   Headache    migraines   Hypothyroidism    Ovarian cyst    Thyroid disease    Past Surgical History:  Procedure Laterality Date   CHOLECYSTECTOMY N/A 01/25/2020   Procedure: LAPAROSCOPIC CHOLECYSTECTOMY WITH INTRAOPERATIVE CHOLANGIOGRAM;  Surgeon: Griselda Miner, MD;  Location: [facility] OR;  Service: General;  Laterality: N/A;   LASIK  2017   NO PAST SURGERIES     wisdom tooth removal     Family History: family history includes Cancer in her maternal grandmother; Hashimoto's thyroiditis in her mother; Hypertension in her father and mother. Social History:  reports that she has never smoked. She has never used smokeless tobacco. She reports that she does not currently use alcohol. She reports that she does not use drugs.     Maternal Diabetes: No1hr 32 Genetic Screening: Normal Maternal Ultrasounds/Referrals: Normal Fetal Ultrasounds or other Referrals:  None Maternal Substance Abuse:  No Significant Maternal Medications:  None Significant Maternal Lab Results:  Group B Strep negative Number of Prenatal Visits:greater than 3 verified prenatal visits Other Comments:  None  Review of Systems  Constitutional:  Negative for chills and fever.  Respiratory:  Negative for shortness of breath.   Cardiovascular:  Negative for chest pain, palpitations and leg swelling.  Gastrointestinal:  Negative for abdominal pain and vomiting.  Genitourinary:  Positive for  vaginal discharge.  Neurological:  Negative for dizziness, weakness and headaches.  Psychiatric/Behavioral:  Negative for suicidal ideas.    Maternal Medical History:  Reason for admission: Rupture of membranes.   Fetal activity: Perceived fetal activity is normal.   Prenatal Complications - Diabetes: none.   Dilation: 4.5 Effacement (%): 80 Station: -2 Exam by:: J.Cox, RN Blood pressure (!) 108/57, pulse 95, temperature 98.3 F (36.8 C), temperature source Oral, resp. rate 16, height 5\' 4"  (1.626 m), weight 93 kg, last menstrual period 04/27/2022, SpO2 99%. Exam Physical Exam Constitutional:      General: She is not in acute distress.    Appearance: She is well-developed.  HENT:     Head: Normocephalic and atraumatic.  Eyes:     Pupils: Pupils are equal, round, and reactive to light.  Cardiovascular:     Rate and Rhythm: Normal rate and regular rhythm.     Heart sounds: No murmur heard.    No gallop.  Abdominal:     Tenderness: There is no abdominal tenderness. There is no guarding or rebound.  Genitourinary:    Vagina: Normal.     Uterus: Normal.   Musculoskeletal:        General: Normal range of motion.  Cervical back: Normal range of motion and neck supple.  Skin:    General: Skin is warm and dry.  Neurological:     Mental Status: She is alert and oriented to person, place, and time.     Prenatal labs: ABO, Rh: --/--/O POS (07/11 0406) Antibody: NEG (07/11 0406) Rubella: Immune (12/19 0000) RPR: Nonreactive (12/19 0000)  HBsAg: Negative (12/19 0000)  HIV: Non-reactive (04/30 0000)  GBS:   neg  Cat 1 tracing, TOCO q81min CE 4-5/80/-2 per RN @ [phone]  Assessment/Plan: This is a 31yo G2P1001 @ 38 1/7 admitted with SROM. GBS neg, s/p epidural. Currently progressing on her own. Anticipate SVD, augment as needed, pelvis proven to [id number] Valerie Roys Jaykob Minichiello 05/02/2023, 9:21 AM

## 2023-05-02 NOTE — MAU Note (Signed)
.  Sarah [name] is a 32 y.o. at [redacted]w[redacted]d here in MAU reporting: SROM at home around 0100 pt reports clear fluid constantly leaking, reports wearing a pad at this time. Patient reports lower abdominal cramping.  Denies VB, and reports + FM.   Had SVE 7/10 in office was 2 cm with vertex presentation per patient.    Onset of complaint: 0100 Pain score: 1/10 Vitals:   05/02/23 0349 05/02/23 0351  BP: 133/78   Pulse: 87   Resp:    Temp:    SpO2:  96%     FHT:165 Lab orders placed from triage:  mau labor

## 2023-05-03 LAB — CBC
HCT: 27.5 % — ABNORMAL LOW (ref 36.0–46.0)
Hemoglobin: 8.8 g/dL — ABNORMAL LOW (ref 12.0–15.0)
MCH: 24.7 pg — ABNORMAL LOW (ref 26.0–34.0)
MCHC: 32 g/dL (ref 30.0–36.0)
MCV: 77.2 fL — ABNORMAL LOW (ref 80.0–100.0)
Platelets: 222 10*3/uL (ref 150–400)
RBC: 3.56 MIL/uL — ABNORMAL LOW (ref 3.87–5.11)
RDW: 13.2 % (ref 11.5–15.5)
WBC: 11.3 10*3/uL — ABNORMAL HIGH (ref 4.0–10.5)
nRBC: 0 % (ref 0.0–0.2)

## 2023-05-03 MED ORDER — IBUPROFEN 600 MG PO TABS: 600.0000 mg | ORAL_TABLET | Freq: Four times a day (QID) | ORAL | 0 refills | Status: DC

## 2023-05-03 NOTE — Discharge Instructions (Signed)
As per discharge pamphlet °

## 2023-05-03 NOTE — Progress Notes (Signed)
PPD #1 No problems Afeb, VSS Fundus firm, NT at U-1 Continue routine postpartum care.  Wants circumcision, baby not feeding great so will do tomorrow prior to discharge

## 2023-05-03 NOTE — Discharge Summary (Signed)
Postpartum Discharge Summary      Patient Name: Sarah [name] DOB: September 21, 1991 MRN: [id number]  Date of admission: 05/02/2023 Delivery date:05/02/2023 Delivering provider: Carlisle Cater Date of discharge: 05/03/2023  Admitting diagnosis: Normal labor [O80, Z37.9] Intrauterine pregnancy: [redacted]w[redacted]d     Secondary diagnosis:  Principal Problem:   Normal labor     Discharge diagnosis: Term Pregnancy Delivered                                               Hospital course: Onset of Labor With Vaginal Delivery      32 y.o. yo [id number] at [redacted]w[redacted]d was admitted in Latent Labor with SROM on 05/02/2023. Labor course was complicated by nothing  Membrane Rupture Time/Date: 1:00 AM,05/02/2023  Delivery Method:Vaginal, Spontaneous Episiotomy: None Lacerations:  1st degree;Perineal Patient had a postpartum course complicated by nothing.  She is ambulating, tolerating a regular diet, passing flatus, and urinating well. Patient is discharged home in stable condition on 05/03/23.  Newborn Data: Birth date:05/02/2023 Birth time:12:39 PM Gender:Female Living status:Living Apgars:8 ,9  Weight:3270 g   Physical exam  Vitals:   05/02/23 2000 05/03/23 0001 05/03/23 0450 05/03/23 1419  BP: (!) 101/57 103/60 (!) 95/54 108/76  Pulse: 75 73 79 80  Resp: 17 17 17 18   Temp: 98.2 F (36.8 C) 98 F (36.7 C) 97.8 F (36.6 C) 97.9 F (36.6 C)  TempSrc: Oral Oral Oral Oral  SpO2: 98% 99% 99%   Weight:      Height:       General: alert Lochia: appropriate Uterine Fundus: firm  Labs: Lab Results  Component Value Date   WBC 11.3 (H) 05/03/2023   HGB 8.8 (L) 05/03/2023   HCT 27.5 (L) 05/03/2023   MCV 77.2 (L) 05/03/2023   PLT 222 05/03/2023      Latest Ref Rng & Units 06/10/2020    3:23 PM  CMP  Glucose 65 - 99 mg/dL 85   BUN 7 - 25 mg/dL 15   Creatinine 1.47 - 1.10 mg/dL 8.29   Sodium 562 - 130 mmol/L 137   Potassium 3.5 - 5.3 mmol/L 4.1   Chloride 98 - 110 mmol/L 104   CO2 20 - 32  mmol/L 24   Calcium 8.6 - 10.2 mg/dL 9.2   Total Protein 6.1 - 8.1 g/dL 6.9   Total Bilirubin 0.2 - 1.2 mg/dL 0.4   AST 10 - 30 U/L 14   ALT 6 - 29 U/L 13    Edinburgh Score:    05/03/2023   11:14 AM  Edinburgh Postnatal Depression Scale Screening Tool  I have been able to laugh and see the funny side of things. 1  I have looked forward with enjoyment to things. 2  I have blamed myself unnecessarily when things went wrong. 1  I have been anxious or worried for no good reason. 0  I have felt scared or panicky for no good reason. 0  Things have been getting on top of me. 2  I have been so unhappy that I have had difficulty sleeping. 2  I have felt sad or miserable. 1  I have been so unhappy that I have been crying. 1  The thought of harming myself has occurred to me. 0  Edinburgh Postnatal Depression Scale Total 10      After visit meds:  Allergies  as of 05/03/2023       Reactions   Azithromycin    GI-- stomach cramps   Penicillins Hives   Did it involve swelling of the face/tongue/throat, SOB, or low BP? No Did it involve sudden or severe rash/hives, skin peeling, or any reaction on the inside of your mouth or nose? No Did you need to seek medical attention at a hospital or doctor's office? No When did it last happen?    3-4 years ago   If all above answers are "NO", may proceed with cephalosporin use.        Medication List     STOP taking these medications    aspirin EC 81 MG tablet   cefdinir 300 MG capsule Commonly known as: OMNICEF       TAKE these medications    Claritin 10 MG tablet Generic drug: loratadine   ibuprofen 600 MG tablet Commonly known as: ADVIL Take 1 tablet (600 mg total) by mouth every 6 (six) hours.   omeprazole 20 MG tablet Commonly known as: PriLOSEC OTC Take 1 tablet (20 mg total) by mouth daily.   ondansetron 8 MG disintegrating tablet Commonly known as: ZOFRAN-ODT Take 8 mg by mouth 2 (two) times daily as needed.    thyroid 30 MG tablet Commonly known as: NP Thyroid Take 1 tablet (30 mg total) by mouth daily before breakfast.         Discharge home in stable condition Infant Feeding: Breast Infant Disposition:home with mother Discharge instruction: per After Visit Summary and Postpartum booklet. Activity: Advance as tolerated. Pelvic rest for 6 weeks.  Diet: routine diet Postpartum Appointment:6 weeks Follow up Visit:  Follow-up Information     [facility] Schedule an appointment as soon as possible for a visit in 6 week(s).   Contact information: [address]  [address] [place] [address] [phone]                     05/03/2023 Zenaida Niece, MD

## 2023-05-03 NOTE — Progress Notes (Signed)
MOB was referred for history of anxiety.  * Referral screened out by Clinical Social Worker because none of the following criteria appear to apply:  ~ History of anxiety during this pregnancy, or of post-partum depression following prior delivery.  ~ Diagnosis of anxiety within last 3 years  Per OB notes, MOB did not indicate any signs/symptoms during pregnancy.  OR   * MOB's symptoms currently being treated with medication and/or therapy.   Please contact the Clinical Social Worker if needs arise, by MOB request, or if MOB scores greater than 9/yes to question 10 on Edinburgh Postpartum Depression Screen.   Tomislav Micale, LCSWA Clinical Social Worker [phone] 

## 2023-05-03 NOTE — Lactation Note (Signed)
This note was copied from a baby's chart. Lactation Consultation Note  Patient Name: Sarah [name] ZOXWR'U Date: 05/03/2023 Age:33 hours Reason for consult: Initial assessment;Early term 37-38.6wks;Infant weight loss (2 % weight loss. baby asleep and LC wi;; F/U for BF assist. LC reviewed and updated the doc flow sheets per mom)   Maternal Data    Feeding Mother's Current Feeding Choice: Breast Milk  LATCH Score - 10 according to doc flow sheets    Lactation Tools Discussed/Used    Interventions Interventions: Breast feeding basics reviewed;Education  Discharge Pump: Personal  Consult Status Consult Status: Follow-up Date: 05/03/23 Follow-up type: In-patient    Matilde Sprang Laela Deviney 05/03/2023, 10:16 AM

## 2023-05-03 NOTE — Lactation Note (Signed)
This note was copied from a baby's chart. Lactation Consultation Note  Patient Name: Sarah [name] ZOXWR'U Date: 05/03/2023 Age:33 hours  Attempted to see mom but she was sleeping.   Maternal Data    Feeding    LATCH Score                    Lactation Tools Discussed/Used    Interventions    Discharge    Consult Status      Charyl Dancer 05/03/2023, 2:44 AM

## 2023-05-03 NOTE — Social Work (Signed)
CSW received consult for hx of Anxiety and EPDS score of 10.  CSW met with MOB to offer support and complete assessment. CSW entered the room and observed MOB in bed resting and holding the infant, FOB and MGM at bedside. CSW introduced self, and CSW role, MOB was agreeable to visit and allowed guest to remain in the room. CSW inquired about how MBO was feeling, MOB reported good. CSW inquired about MOB's mood over the lat 7 days due to her EPDS score. MOB reported she had a rough pregnancy which is why her score was a little elevated. CSW inquired about any MH hx, MOB reported she has Anxiety. MOB reported therapy in the in the past and she utilizes skills she learned to cope. CSW assessed for safety, MOB denied any SI or HI. CSW provided education regarding the baby blues period vs. perinatal mood disorders, discussed treatment and gave resources for mental health follow up if concerns arise.  CSW recommends self-evaluation during the postpartum time period using the New Mom Checklist from Postpartum Progress and encouraged MOB to contact a medical professional if symptoms are noted at any time.  MOB identified her mom and FOB as her supports.   CSW provided review of Sudden Infant Death Syndrome (SIDS) precautions.  MOB identified [facility] for infants follow up care. MOB reported they have all necessary items for the infant including a bassinet and car seat.  CSW identifies no further need for intervention and no barriers to discharge at this time.  Wende Neighbors, LCSWA Clinical Social Worker [phone]

## 2023-05-03 NOTE — Lactation Note (Signed)
This note was copied from a baby's chart. Lactation Consultation Note  Patient Name: Sarah [name] [id number] Date: 05/03/2023 Age:33 hours Reason for consult: Follow-up assessment;Early term 37-38.6wks;Infant weight loss;Maternal endocrine disorder (baby just had a bath) LC offered to assist to latch and mom receptive. Started on the right breast due to mom saying she was having a difficult time latching on that breast. Mom mentioned the baby had been spitty over night.  LC recommended burping the baby prior to latching, in between and after. Baby burped and latched easily. Worked on depth. Swallows increased with warm moist heat above the baby on the breast.  Baby paused off the breast for less than a minute and mom latched the baby by herself with depth and baby fed 21 mins.  Latch score 9 .  LC reviewed BF goals for 24 hours - feed with feedings cues and by 3 hours STS , offer the breast.   Maternal Data Has patient been taught Hand Expression?: Yes (few drops)  Feeding Mother's Current Feeding Choice: Breast Milk  LATCH Score Latch: Grasps breast easily, tongue down, lips flanged, rhythmical sucking.  Audible Swallowing: Spontaneous and intermittent  Type of Nipple: Everted at rest and after stimulation  Comfort (Breast/Nipple): Soft / non-tender  Hold (Positioning): Assistance needed to correctly position infant at breast and maintain latch.  LATCH Score: 9   Lactation Tools Discussed/Used    Interventions Interventions: Breast feeding basics reviewed;Assisted with latch;Skin to skin;Breast massage;Hand express;Reverse pressure;Breast compression;Adjust position;Support pillows;Position options;Education;LC Services brochure  Discharge Pump: DEBP;Personal WIC Program: No  Consult Status Consult Status: Follow-up Date: 05/04/23 Follow-up type: In-patient    Sarah [name] [name] 05/03/2023, 10:21 AM

## 2023-05-27 ENCOUNTER — Telehealth (HOSPITAL_COMMUNITY): Payer: Self-pay | Admitting: *Deleted

## 2023-05-27 DIAGNOSIS — Z1331 Encounter for screening for depression: Secondary | ICD-10-CM

## 2023-05-27 NOTE — Telephone Encounter (Signed)
Attempted hospital discharge follow-up call. Left message for patient to return RN call with any questions or concerns.  Hospital inpatient EPDS=10. CSW consult completed during stay. Ambulatory IBH referral made now per protocol, and Dr. Alysia Penna notified via chart. Paulene Floor, RN, 05/27/23, [phone]

## 2023-05-29 ENCOUNTER — Telehealth: Payer: Self-pay | Admitting: Clinical

## 2023-05-29 NOTE — Telephone Encounter (Signed)
Attempt call regarding referral; Left HIPPA-compliant message to call back Tinnie Kunin from [facility] at [facility] at  [phone] (Maylene Crocker's office).    

## 2023-06-03 ENCOUNTER — Telehealth: Payer: Self-pay | Admitting: Clinical

## 2023-06-03 NOTE — Telephone Encounter (Signed)
Second Attempt call regarding referral; Left HIPPA-compliant message to call back Asher Muir from [employer] for [employer] at [facility] at  [phone] [facility] office).

## 2023-06-14 ENCOUNTER — Other Ambulatory Visit: Payer: Self-pay | Admitting: Family Medicine

## 2023-06-14 DIAGNOSIS — E039 Hypothyroidism, unspecified: Secondary | ICD-10-CM

## 2023-06-24 ENCOUNTER — Telehealth: Payer: BC Managed Care – PPO | Admitting: Physician Assistant

## 2023-06-24 DIAGNOSIS — B9689 Other specified bacterial agents as the cause of diseases classified elsewhere: Secondary | ICD-10-CM | POA: Diagnosis not present

## 2023-06-24 DIAGNOSIS — J208 Acute bronchitis due to other specified organisms: Secondary | ICD-10-CM | POA: Diagnosis not present

## 2023-06-24 MED ORDER — DOXYCYCLINE HYCLATE 100 MG PO TABS
100.0000 mg | ORAL_TABLET | Freq: Two times a day (BID) | ORAL | 0 refills | Status: DC
Start: 2023-06-24 — End: 2023-08-27
  Filled 2023-06-25: qty 20, 10d supply, fill #0

## 2023-06-24 MED ORDER — PROMETHAZINE-DM 6.25-15 MG/5ML PO SYRP
5.0000 mL | ORAL_SOLUTION | Freq: Four times a day (QID) | ORAL | 0 refills | Status: DC | PRN
Start: 2023-06-24 — End: 2023-08-27
  Filled 2023-06-25: qty 118, 6d supply, fill #0

## 2023-06-24 NOTE — Progress Notes (Signed)
Virtual Visit Consent   Sarah [name], you are scheduled for a virtual visit with a [place] provider today. Just as with appointments in the office, your consent must be obtained to participate. Your consent will be active for this visit and any virtual visit you may have with one of our providers in the next 365 days. If you have a MyChart account, a copy of this consent can be sent to you electronically.  As this is a virtual visit, video technology does not allow for your provider to perform a traditional examination. This may limit your provider's ability to fully assess your condition. If your provider identifies any concerns that need to be evaluated in person or the need to arrange testing (such as labs, EKG, etc.), we will make arrangements to do so. Although advances in technology are sophisticated, we cannot ensure that it will always work on either your end or our end. If the connection with a video visit is poor, the visit may have to be switched to a telephone visit. With either a video or telephone visit, we are not always able to ensure that we have a secure connection.  By engaging in this virtual visit, you consent to the provision of healthcare and authorize for your insurance to be billed (if applicable) for the services provided during this visit. Depending on your insurance coverage, you may receive a charge related to this service.  I need to obtain your verbal consent now. Are you willing to proceed with your visit today? Sarah [name] has provided verbal consent on 06/24/2023 for a virtual visit (video or telephone). Sarah Loveless, PA-C  Date: 06/24/2023 10:06 AM  Virtual Visit via Video Note   I, Sarah [name], connected with  Sarah [name]  ([id number], 12/07/1990) on 06/24/23 at 10:00 AM EDT by a video-enabled telemedicine application and verified that I am speaking with the correct person using two identifiers.  Location: Patient: Virtual Visit  Location Patient: Home Provider: Virtual Visit Location Provider: Home Office   I discussed the limitations of evaluation and management by telemedicine and the availability of in person appointments. The patient expressed understanding and agreed to proceed.    History of Present Illness: Sarah [name] is a 32 y.o. who identifies as a female who was assigned female at birth, and is being seen today for chest congestion.  HPI: URI  This is a new problem. The current episode started 1 to 4 weeks ago (Head cold symptoms started over a week ago, started to improve for 2 days; now with worsening symptoms in chest). The problem has been gradually worsening. There has been no fever. Associated symptoms include chest pain (tightness), congestion, coughing, headaches, nausea, rhinorrhea, sinus pain (improving) and a sore throat (mildly scratchy). Pertinent negatives include no diarrhea, ear pain, plugged ear sensation, sneezing, vomiting or wheezing. Treatments tried: mucinex, alka seltzer cold and cough, ricola. The treatment provided no relief.   Patient is not breastfeeding.  Problems:  Patient Active Problem List   Diagnosis Date Noted   Normal labor 05/02/2023   Anxiety and depression 11/09/2021   Gallstones 07/24/2019   Hypothyroidism 03/31/2019    Allergies:  Allergies  Allergen Reactions   Azithromycin     GI-- stomach cramps   Penicillins Hives    Did it involve swelling of the face/tongue/throat, SOB, or low BP? No Did it involve sudden or severe rash/hives, skin peeling, or any reaction on the inside of your mouth or nose? No Did you  need to seek medical attention at a hospital or doctor's office? No When did it last happen?    3-4 years ago   If all above answers are "NO", may proceed with cephalosporin use.    Medications:  Current Outpatient Medications:    doxycycline (VIBRA-TABS) 100 MG tablet, Take 1 tablet (100 mg total) by mouth 2 (two) times daily., Disp: 20 tablet,  Rfl: 0   promethazine-dextromethorphan (PROMETHAZINE-DM) 6.25-15 MG/5ML syrup, Take 5 mLs by mouth 4 (four) times daily as needed., Disp: 118 mL, Rfl: 0   ibuprofen (ADVIL) 600 MG tablet, Take 1 tablet (600 mg total) by mouth every 6 (six) hours., Disp: 30 tablet, Rfl: 0   loratadine (CLARITIN) 10 MG tablet, , Disp: , Rfl:    NP THYROID 30 MG tablet, TAKE 1 TABLET(30 MG) BY MOUTH DAILY BEFORE BREAKFAST, Disp: 90 tablet, Rfl: 1   omeprazole (PRILOSEC OTC) 20 MG tablet, Take 1 tablet (20 mg total) by mouth daily., Disp: 30 tablet, Rfl: 3   ondansetron (ZOFRAN-ODT) 8 MG disintegrating tablet, Take 8 mg by mouth 2 (two) times daily as needed., Disp: , Rfl:   Observations/Objective: Patient is well-developed, well-nourished in no acute distress.  Resting comfortably at home.  Head is normocephalic, atraumatic.  No labored breathing.  Speech is clear and coherent with logical content.  Patient is alert and oriented at baseline.    Assessment and Plan: 1. Acute bacterial bronchitis - doxycycline (VIBRA-TABS) 100 MG tablet; Take 1 tablet (100 mg total) by mouth 2 (two) times daily.  Dispense: 20 tablet; Refill: 0 - promethazine-dextromethorphan (PROMETHAZINE-DM) 6.25-15 MG/5ML syrup; Take 5 mLs by mouth 4 (four) times daily as needed.  Dispense: 118 mL; Refill: 0  - Worsening over a week despite OTC medications - Will treat with Doxycycline and Promethazine DM - Can continue Mucinex  - Push fluids.  - Rest.  - Steam and humidifier can help - Seek in person evaluation if worsening or symptoms fail to improve    Follow Up Instructions: I discussed the assessment and treatment plan with the patient. The patient was provided an opportunity to ask questions and all were answered. The patient agreed with the plan and demonstrated an understanding of the instructions.  A copy of instructions were sent to the patient via MyChart unless otherwise noted below.    The patient was advised to call back  or seek an in-person evaluation if the symptoms worsen or if the condition fails to improve as anticipated.  Time:  I spent 10 minutes with the patient via telehealth technology discussing the above problems/concerns.    Sarah Loveless, PA-C

## 2023-06-24 NOTE — Patient Instructions (Signed)
Sarah [name], thank you for joining Sarah Loveless, PA-C for today's virtual visit.  While this provider is not your primary care provider (PCP), if your PCP is located in our provider database this encounter information will be shared with them immediately following your visit.   A [facility] MyChart account gives you access to today's visit and all your visits, tests, and labs performed at [facility] " click here if you don't have a [facility] MyChart account or go to [email or web address]  Consent: (Patient) Sarah [name] provided verbal consent for this virtual visit at the beginning of the encounter.  Current Medications:  Current Outpatient Medications:    doxycycline (VIBRA-TABS) 100 MG tablet, Take 1 tablet (100 mg total) by mouth 2 (two) times daily., Disp: 20 tablet, Rfl: 0   promethazine-dextromethorphan (PROMETHAZINE-DM) 6.25-15 MG/5ML syrup, Take 5 mLs by mouth 4 (four) times daily as needed., Disp: 118 mL, Rfl: 0   ibuprofen (ADVIL) 600 MG tablet, Take 1 tablet (600 mg total) by mouth every 6 (six) hours., Disp: 30 tablet, Rfl: 0   loratadine (CLARITIN) 10 MG tablet, , Disp: , Rfl:    NP THYROID 30 MG tablet, TAKE 1 TABLET(30 MG) BY MOUTH DAILY BEFORE BREAKFAST, Disp: 90 tablet, Rfl: 1   omeprazole (PRILOSEC OTC) 20 MG tablet, Take 1 tablet (20 mg total) by mouth daily., Disp: 30 tablet, Rfl: 3   ondansetron (ZOFRAN-ODT) 8 MG disintegrating tablet, Take 8 mg by mouth 2 (two) times daily as needed., Disp: , Rfl:    Medications ordered in this encounter:  Meds ordered this encounter  Medications   doxycycline (VIBRA-TABS) 100 MG tablet    Sig: Take 1 tablet (100 mg total) by mouth 2 (two) times daily.    Dispense:  20 tablet    Refill:  0    Order Specific Question:   Supervising Provider    Answer:   Merrilee Jansky [id number]   promethazine-dextromethorphan (PROMETHAZINE-DM) 6.25-15 MG/5ML syrup    Sig: Take 5 mLs by mouth 4 (four) times  daily as needed.    Dispense:  118 mL    Refill:  0    Order Specific Question:   Supervising Provider    Answer:   Merrilee Jansky [[id number]]     *If you need refills on other medications prior to your next appointment, please contact your pharmacy*  Follow-Up: Call back or seek an in-person evaluation if the symptoms worsen or if the condition fails to improve as anticipated.  [facility] [phone]  Other Instructions Acute Bronchitis, Adult  Acute bronchitis is sudden inflammation of the main airways (bronchi) that come off the windpipe (trachea) in the lungs. The swelling causes the airways to get smaller and make more mucus than normal. This can make it hard to breathe and can cause coughing or noisy breathing (wheezing). Acute bronchitis may last several weeks. The cough may last longer. Allergies, asthma, and exposure to smoke may make the condition worse. What are the causes? This condition can be caused by germs and by substances that irritate the lungs, including: Cold and flu viruses. The most common cause of this condition is the virus that causes the common cold. Bacteria. This is less common. Breathing in substances that irritate the lungs, including: Smoke from cigarettes and other forms of tobacco. Dust and pollen. Fumes from household cleaning products, gases, or burned fuel. Indoor or outdoor air pollution. What increases the risk? The following factors may make you more  likely to develop this condition: A weak body's defense system, also called the immune system. A condition that affects your lungs and breathing, such as asthma. What are the signs or symptoms? Common symptoms of this condition include: Coughing. This may bring up clear, yellow, or green mucus from your lungs (sputum). Wheezing. Runny or stuffy nose. Having too much mucus in your lungs (chest congestion). Shortness of breath. Aches and pains, including sore throat or  chest. How is this diagnosed? This condition is usually diagnosed based on: Your symptoms and medical history. A physical exam. You may also have other tests, including tests to rule out other conditions, such as pneumonia. These tests include: A test of lung function. Test of a mucus sample to look for the presence of bacteria. Tests to check the oxygen level in your blood. Blood tests. Chest X-ray. How is this treated? Most cases of acute bronchitis clear up over time without treatment. Your health care provider may recommend: Drinking more fluids to help thin your mucus so it is easier to cough up. Taking inhaled medicine (inhaler) to improve air flow in and out of your lungs. Using a vaporizer or a humidifier. These are machines that add water to the air to help you breathe better. Taking a medicine that thins mucus and clears congestion (expectorant). Taking a medicine that prevents or stops coughing (cough suppressant). It is not common to take an antibiotic medicine for this condition. Follow these instructions at home:  Take over-the-counter and prescription medicines only as told by your health care provider. Use an inhaler, vaporizer, or humidifier as told by your health care provider. Take two teaspoons (10 mL) of honey at bedtime to lessen coughing at night. Drink enough fluid to keep your urine pale yellow. Do not use any products that contain nicotine or tobacco. These products include cigarettes, chewing tobacco, and vaping devices, such as e-cigarettes. If you need help quitting, ask your health care provider. Get plenty of rest. Return to your normal activities as told by your health care provider. Ask your health care provider what activities are safe for you. Keep all follow-up visits. This is important. How is this prevented? To lower your risk of getting this condition again: Wash your hands often with soap and water for at least 20 seconds. If soap and water are  not available, use hand sanitizer. Avoid contact with people who have cold symptoms. Try not to touch your mouth, nose, or eyes with your hands. Avoid breathing in smoke or chemical fumes. Breathing smoke or chemical fumes will make your condition worse. Get the flu shot every year. Contact a health care provider if: Your symptoms do not improve after 2 weeks. You have trouble coughing up the mucus. Your cough keeps you awake at night. You have a fever. Get help right away if you: Cough up blood. Feel pain in your chest. Have severe shortness of breath. Faint or keep feeling like you are going to faint. Have a severe headache. Have a fever or chills that get worse. These symptoms may represent a serious problem that is an emergency. Do not wait to see if the symptoms will go away. Get medical help right away. Call your local emergency services (911 in the [place] Do not drive yourself to the hospital. Summary Acute bronchitis is inflammation of the main airways (bronchi) that come off the windpipe (trachea) in the lungs. The swelling causes the airways to get smaller and make more mucus than normal. Drinking more  fluids can help thin your mucus so it is easier to cough up. Take over-the-counter and prescription medicines only as told by your health care provider. Do not use any products that contain nicotine or tobacco. These products include cigarettes, chewing tobacco, and vaping devices, such as e-cigarettes. If you need help quitting, ask your health care provider. Contact a health care provider if your symptoms do not improve after 2 weeks. This information is not intended to replace advice given to you by your health care provider. Make sure you discuss any questions you have with your health care provider. Document Revised: 01/18/2022 Document Reviewed: 02/08/2021 Elsevier Patient Education  2024 Elsevier Inc.    If you have been instructed to have an in-person evaluation today  at a local Urgent Care facility, please use the link below. It will take you to a list of all of our available [facility], including address, phone number and hours of operation. Please do not delay care.  [facility]  If you or a family member do not have a primary care provider, use the link below to schedule a visit and establish care. When you choose a [facility] primary care physician or advanced practice provider, you gain a long-term partner in health. Find a Primary Care Provider  Learn more about [facility]'s in-office and virtual care options: [facility] - Get Care Now

## 2023-06-25 ENCOUNTER — Other Ambulatory Visit (HOSPITAL_BASED_OUTPATIENT_CLINIC_OR_DEPARTMENT_OTHER): Payer: Self-pay

## 2023-07-24 ENCOUNTER — Other Ambulatory Visit: Payer: Self-pay

## 2023-07-24 ENCOUNTER — Other Ambulatory Visit (HOSPITAL_BASED_OUTPATIENT_CLINIC_OR_DEPARTMENT_OTHER): Payer: Self-pay

## 2023-07-24 ENCOUNTER — Encounter: Payer: Self-pay | Admitting: Podiatry

## 2023-07-24 ENCOUNTER — Ambulatory Visit (INDEPENDENT_AMBULATORY_CARE_PROVIDER_SITE_OTHER): Payer: BC Managed Care – PPO | Admitting: Podiatry

## 2023-07-24 ENCOUNTER — Ambulatory Visit (INDEPENDENT_AMBULATORY_CARE_PROVIDER_SITE_OTHER): Payer: BC Managed Care – PPO

## 2023-07-24 DIAGNOSIS — B353 Tinea pedis: Secondary | ICD-10-CM

## 2023-07-24 DIAGNOSIS — M79671 Pain in right foot: Secondary | ICD-10-CM

## 2023-07-24 DIAGNOSIS — B351 Tinea unguium: Secondary | ICD-10-CM

## 2023-07-24 DIAGNOSIS — M84374A Stress fracture, right foot, initial encounter for fracture: Secondary | ICD-10-CM

## 2023-07-24 MED ORDER — KETOCONAZOLE 2 % EX CREA
1.0000 | TOPICAL_CREAM | Freq: Every day | CUTANEOUS | 2 refills | Status: DC
  Filled 2023-07-24: qty 60, 30d supply, fill #0

## 2023-07-24 MED ORDER — CICLOPIROX 8 % EX SOLN
Freq: Every day | CUTANEOUS | 0 refills | Status: DC
  Filled 2023-07-24: qty 6.6, 30d supply, fill #0

## 2023-07-24 NOTE — Progress Notes (Signed)
  Subjective:  Patient ID: Sarah [name], female    DOB: [birth year],   MRN: [id number]  No chief complaint on file.   32 y.o. female presents for concern of right foot pain. Relates years ago she injured the foot and had similar pain and did well. Recently she started having pain in the last few weeks. She just had a baby 12 weeks ago. Has lost weight and been on her feet. Relates pain has returned and worse than before. Denies treatments. Also relates some concern for fungus in her skin and toe nail on the right foot around the fourth toe.  . Denies any other pedal complaints. Denies n/v/f/c.   Past Medical History:  Diagnosis Date   Anxiety    Bell's palsy    Gall stones    Gestational hypertension 10/24/2019   Headache    migraines   Hypothyroidism    Ovarian cyst    Thyroid disease     Objective:  Physical Exam: Vascular: DP/PT pulses 2/4 bilateral. CFT <3 seconds. Normal hair growth on digits. No edema.  Skin. No lacerations or abrasions bilateral feet. Some scaling and erythema noted in fourth interspace on right foot. Lateral fourth digit nail with some discoloration and subungual debris.  Musculoskeletal: MMT 5/5 bilateral lower extremities in DF, PF, Inversion and Eversion. Deceased ROM in DF of ankle joint. Tender along diaphysis dorsally on the right foot fifth metatarsal. No pain with vibratory sensation test.  Neurological: Sensation intact to light touch.   Assessment:   1. Stress reaction of right foot, initial encounter   2. Tinea pedis of right foot   3. Onychomycosis      Plan:  Patient was evaluated and treated and all questions answered. -Xrays reviewed. No obvious fractures noted some periosteal reaction noted in the diaphysis of the fifth metatarsal.  -Discussed treatement options for stress fracture; risks, alternatives, and benefits explained. -Dispensed CAM boot. Patient to wear at all times and instructed on use -Recommend protection, rest, ice,  elevation daily until symptoms improve -Rx pain med/antinflammatories as needed -Discussed fungal nail and tinea. Ketoconazole and penlac prescribed to help with these areas.  -Patient to return to office in 4 weeks for serial x-rays to assess healing  or sooner if condition worsens.   Louann Sjogren, DPM

## 2023-08-05 ENCOUNTER — Encounter: Payer: Self-pay | Admitting: Podiatry

## 2023-08-07 ENCOUNTER — Other Ambulatory Visit: Payer: Self-pay | Admitting: Podiatry

## 2023-08-07 DIAGNOSIS — M84374A Stress fracture, right foot, initial encounter for fracture: Secondary | ICD-10-CM

## 2023-08-20 ENCOUNTER — Ambulatory Visit: Payer: BC Managed Care – PPO | Admitting: Podiatry

## 2023-08-26 ENCOUNTER — Ambulatory Visit (HOSPITAL_BASED_OUTPATIENT_CLINIC_OR_DEPARTMENT_OTHER): Payer: BC Managed Care – PPO | Admitting: Orthopaedic Surgery

## 2023-08-26 DIAGNOSIS — M7741 Metatarsalgia, right foot: Secondary | ICD-10-CM

## 2023-08-26 NOTE — Progress Notes (Signed)
Chief Complaint: Bilateral fifth metatarsal pain     History of Present Illness:    Sarah [name] is a 32 y.o. female presents today with bilateral pain about the fourth and fifth metatarsals that is worse on the right compared to the left.  She does have a history of this worsening with pregnancy.  She has been previously treated with [facility] who recommended a boot and then a postop shoe.  She did not tolerate this quite well.  She is here today for further discussion.  She is pending an MRI of the right foot    PMH/PSH/Family History/Social History/Meds/Allergies:    Past Medical History:  Diagnosis Date   Anxiety    Bell's palsy    Gall stones    Gestational hypertension 10/24/2019   Headache    migraines   Hypothyroidism    Ovarian cyst    Thyroid disease    Past Surgical History:  Procedure Laterality Date   CHOLECYSTECTOMY N/A 01/25/2020   Procedure: LAPAROSCOPIC CHOLECYSTECTOMY WITH INTRAOPERATIVE CHOLANGIOGRAM;  Surgeon: Griselda Miner, MD;  Location: [facility] OR;  Service: General;  Laterality: N/A;   LASIK  2017   NO PAST SURGERIES     wisdom tooth removal     Social History   Socioeconomic History   Marital status: Married    Spouse name: Not on file   Number of children: Not on file   Years of education: Not on file   Highest education level: Not on file  Occupational History   Not on file  Tobacco Use   Smoking status: Never   Smokeless tobacco: Never  Vaping Use   Vaping status: Never Used  Substance and Sexual Activity   Alcohol use: Not Currently    Comment: socially   Drug use: Never   Sexual activity: Not Currently    Birth control/protection: None  Other Topics Concern   Not on file  Social History Narrative   Not on file   Social Determinants of Health   Financial Resource Strain: Not on file  Food Insecurity: Not on file  Transportation Needs: Not on file  Physical Activity: Not on file  Stress: Not on file  Social  Connections: Not on file   Family History  Problem Relation Age of Onset   Hypertension Mother    Hashimoto's thyroiditis Mother    Hypertension Father    Cancer Maternal Grandmother        bone marrow cancer   Allergies  Allergen Reactions   Azithromycin     GI-- stomach cramps   Penicillins Hives    Did it involve swelling of the face/tongue/throat, SOB, or low BP? No Did it involve sudden or severe rash/hives, skin peeling, or any reaction on the inside of your mouth or nose? No Did you need to seek medical attention at a hospital or doctor's office? No When did it last happen?    3-4 years ago   If all above answers are "NO", may proceed with cephalosporin use.    Current Outpatient Medications  Medication Sig Dispense Refill   ciclopirox (PENLAC) 8 % solution Apply topically at bedtime. Apply over nail and surrounding skin. Apply daily over previous coat. After seven (7) days, may remove with alcohol and continue cycle. 6.6 mL 0   doxycycline (VIBRA-TABS) 100 MG tablet Take 1 tablet (100 mg total) by mouth 2 (two) times daily. 20 tablet 0   ibuprofen (ADVIL) 600 MG tablet Take 1 tablet (  600 mg total) by mouth every 6 (six) hours. 30 tablet 0   ketoconazole (NIZORAL) 2 % cream Apply 1 Application topically daily. 60 g 2   loratadine (CLARITIN) 10 MG tablet      NP THYROID 30 MG tablet TAKE 1 TABLET(30 MG) BY MOUTH DAILY BEFORE BREAKFAST 90 tablet 1   omeprazole (PRILOSEC OTC) 20 MG tablet Take 1 tablet (20 mg total) by mouth daily. 30 tablet 3   ondansetron (ZOFRAN-ODT) 8 MG disintegrating tablet Take 8 mg by mouth 2 (two) times daily as needed.     promethazine-dextromethorphan (PROMETHAZINE-DM) 6.25-15 MG/5ML syrup Take 5 mLs by mouth 4 (four) times daily as needed. 118 mL 0   No current facility-administered medications for this visit.   No results found.  Review of Systems:   A ROS was performed including pertinent positives and negatives as documented in the  HPI.  Physical Exam :   Constitutional: NAD and appears stated age Neurological: Alert and oriented Psych: Appropriate affect and cooperative unknown if currently breastfeeding.   Comprehensive Musculoskeletal Exam:    There is tenderness to palpation about the fourth and fifth metatarsal heads.  There is no obvious redness or swelling.  Negative shock about the midfoot.  No tenderness about the pes tendons or evidence of peroneal subluxation.  She has painless ankle range of motion from 30 degrees dorsi plantarflexion.  Remainder Distal neurosensory exam is intact   Imaging:   Xray (right foot 3 views): Normal     I personally reviewed and interpreted the radiographs.   Assessment and Plan:   32 y.o. female with bilateral fourth and fifth metatarsalgia type pain which does appear to be related to her walking somewhat of a outer portions of her foot.  I did discuss treatment options.  I did discuss that initially I would recommend a metatarsal pad to oppose the lateral aspect of the foot so that she can more progressively walk on the arch.  We did also discuss modifications and shoewear.  I did discuss that a custom orthotic could be a nice next option should she not get prolonged relief from foot posting as well as shoewear modification   I personally saw and evaluated the patient, and participated in the management and treatment plan.  Huel Cote, MD Attending Physician, Orthopedic Surgery  This document was dictated using Dragon voice recognition software. A reasonable attempt at proof reading has been made to minimize errors.

## 2023-08-27 ENCOUNTER — Telehealth: Payer: BC Managed Care – PPO | Admitting: Physician Assistant

## 2023-08-27 ENCOUNTER — Other Ambulatory Visit (HOSPITAL_BASED_OUTPATIENT_CLINIC_OR_DEPARTMENT_OTHER): Payer: Self-pay

## 2023-08-27 DIAGNOSIS — J069 Acute upper respiratory infection, unspecified: Secondary | ICD-10-CM | POA: Diagnosis not present

## 2023-08-27 DIAGNOSIS — B9689 Other specified bacterial agents as the cause of diseases classified elsewhere: Secondary | ICD-10-CM | POA: Diagnosis not present

## 2023-08-27 MED ORDER — PROMETHAZINE-DM 6.25-15 MG/5ML PO SYRP
5.0000 mL | ORAL_SOLUTION | Freq: Four times a day (QID) | ORAL | 0 refills | Status: DC | PRN
Start: 2023-08-27 — End: 2023-12-03
  Filled 2023-08-27: qty 118, 6d supply, fill #0

## 2023-08-27 MED ORDER — DOXYCYCLINE HYCLATE 100 MG PO TABS
100.0000 mg | ORAL_TABLET | Freq: Two times a day (BID) | ORAL | 0 refills | Status: DC
Start: 2023-08-27 — End: 2023-09-23
  Filled 2023-08-27: qty 20, 10d supply, fill #0

## 2023-08-27 MED ORDER — ALBUTEROL SULFATE HFA 108 (90 BASE) MCG/ACT IN AERS
1.0000 | INHALATION_SPRAY | Freq: Four times a day (QID) | RESPIRATORY_TRACT | 0 refills | Status: DC | PRN
Start: 2023-08-27 — End: 2023-12-03
  Filled 2023-08-27: qty 8.5, 25d supply, fill #0

## 2023-08-27 NOTE — Progress Notes (Signed)
Virtual Visit Consent   Sarah [name], you are scheduled for a virtual visit with a [facility] provider today. Just as with appointments in the office, your consent must be obtained to participate. Your consent will be active for this visit and any virtual visit you may have with one of our providers in the next 365 days. If you have a MyChart account, a copy of this consent can be sent to you electronically.  As this is a virtual visit, video technology does not allow for your provider to perform a traditional examination. This may limit your provider's ability to fully assess your condition. If your provider identifies any concerns that need to be evaluated in person or the need to arrange testing (such as labs, EKG, etc.), we will make arrangements to do so. Although advances in technology are sophisticated, we cannot ensure that it will always work on either your end or our end. If the connection with a video visit is poor, the visit may have to be switched to a telephone visit. With either a video or telephone visit, we are not always able to ensure that we have a secure connection.  By engaging in this virtual visit, you consent to the provision of healthcare and authorize for your insurance to be billed (if applicable) for the services provided during this visit. Depending on your insurance coverage, you may receive a charge related to this service.  I need to obtain your verbal consent now. Are you willing to proceed with your visit today? Rio Nies has provided verbal consent on 08/27/2023 for a virtual visit (video or telephone). [name], PA-C  Date: 08/27/2023 9:16 AM  Virtual Visit via Video Note   I, [name], connected with  Sarah [name]  ([id number], [birth year]) on 08/27/23 at  9:15 AM EST by a video-enabled telemedicine application and verified that I am speaking with the correct person using two identifiers.  Location: Patient: Virtual Visit  Location Patient: Mobile Provider: Virtual Visit Location Provider: Home Office   I discussed the limitations of evaluation and management by telemedicine and the availability of in person appointments. The patient expressed understanding and agreed to proceed.    History of Present Illness: Sarah [name] is a 32 y.o. who identifies as a female who was assigned female at birth, and is being seen today for cough and congestion.  HPI: Sinusitis This is a new problem. The current episode started 1 to 4 weeks ago. The problem has been gradually worsening since onset. There has been no fever. The pain is moderate. Associated symptoms include congestion, coughing, ear pain, headaches, sinus pressure and a sore throat. Pertinent negatives include no chills, hoarse voice, shortness of breath or sneezing. Past treatments include acetaminophen and oral decongestants (alka seltzer cold and lfu, ricola). The treatment provided no relief.     Problems:  Patient Active Problem List   Diagnosis Date Noted   Normal labor 05/02/2023   Anxiety and depression 11/09/2021   Gallstones 07/24/2019   Hypothyroidism 03/31/2019    Allergies:  Allergies  Allergen Reactions   Azithromycin     GI-- stomach cramps   Penicillins Hives    Did it involve swelling of the face/tongue/throat, SOB, or low BP? No Did it involve sudden or severe rash/hives, skin peeling, or any reaction on the inside of your mouth or nose? No Did you need to seek medical attention at a hospital or doctor's office? No When did it last happen?  3-4 years ago   If all above answers are "NO", may proceed with cephalosporin use.    Medications:  Current Outpatient Medications:    albuterol (VENTOLIN HFA) 108 (90 Base) MCG/ACT inhaler, Inhale 1-2 puffs into the lungs every 6 (six) hours as needed., Disp: 8 g, Rfl: 0   doxycycline (VIBRA-TABS) 100 MG tablet, Take 1 tablet (100 mg total) by mouth 2 (two) times daily., Disp: 20 tablet,  Rfl: 0   promethazine-dextromethorphan (PROMETHAZINE-DM) 6.25-15 MG/5ML syrup, Take 5 mLs by mouth 4 (four) times daily as needed., Disp: 118 mL, Rfl: 0   ciclopirox (PENLAC) 8 % solution, Apply topically at bedtime. Apply over nail and surrounding skin. Apply daily over previous coat. After seven (7) days, may remove with alcohol and continue cycle., Disp: 6.6 mL, Rfl: 0   ibuprofen (ADVIL) 600 MG tablet, Take 1 tablet (600 mg total) by mouth every 6 (six) hours., Disp: 30 tablet, Rfl: 0   ketoconazole (NIZORAL) 2 % cream, Apply 1 Application topically daily., Disp: 60 g, Rfl: 2   loratadine (CLARITIN) 10 MG tablet, , Disp: , Rfl:    NP THYROID 30 MG tablet, TAKE 1 TABLET(30 MG) BY MOUTH DAILY BEFORE BREAKFAST, Disp: 90 tablet, Rfl: 1   omeprazole (PRILOSEC OTC) 20 MG tablet, Take 1 tablet (20 mg total) by mouth daily., Disp: 30 tablet, Rfl: 3   ondansetron (ZOFRAN-ODT) 8 MG disintegrating tablet, Take 8 mg by mouth 2 (two) times daily as needed., Disp: , Rfl:   Observations/Objective: Patient is well-developed, well-nourished in no acute distress.  Resting comfortably  Head is normocephalic, atraumatic.  No labored breathing.  Speech is clear and coherent with logical content.  Patient is alert and oriented at baseline.    Assessment and Plan: 1. Bacterial upper respiratory infection - doxycycline (VIBRA-TABS) 100 MG tablet; Take 1 tablet (100 mg total) by mouth 2 (two) times daily.  Dispense: 20 tablet; Refill: 0 - promethazine-dextromethorphan (PROMETHAZINE-DM) 6.25-15 MG/5ML syrup; Take 5 mLs by mouth 4 (four) times daily as needed.  Dispense: 118 mL; Refill: 0 - albuterol (VENTOLIN HFA) 108 (90 Base) MCG/ACT inhaler; Inhale 1-2 puffs into the lungs every 6 (six) hours as needed.  Dispense: 8 g; Refill: 0  - Worsening over a week despite OTC medications - Will treat with Doxycycline and Promethazine DM - Albuterol added - Can continue Mucinex  - Push fluids.  - Rest.  - Steam and  humidifier can help - Seek in person evaluation if worsening or symptoms fail to improve    Follow Up Instructions: I discussed the assessment and treatment plan with the patient. The patient was provided an opportunity to ask questions and all were answered. The patient agreed with the plan and demonstrated an understanding of the instructions.  A copy of instructions were sent to the patient via MyChart unless otherwise noted below.    The patient was advised to call back or seek an in-person evaluation if the symptoms worsen or if the condition fails to improve as anticipated.    [name], PA-C

## 2023-08-27 NOTE — Patient Instructions (Signed)
Sarah [name], thank you for joining Sarah Loveless, PA-C for today's virtual visit.  While this provider is not your primary care provider (PCP), if your PCP is located in our provider database this encounter information will be shared with them immediately following your visit.   A [facility] MyChart account gives you access to today's visit and all your visits, tests, and labs performed at [facility] " click here if you don't have a [facility] MyChart account or go to [email or web address]  Consent: (Patient) Sarah [name] provided verbal consent for this virtual visit at the beginning of the encounter.  Current Medications:  Current Outpatient Medications:    albuterol (VENTOLIN HFA) 108 (90 Base) MCG/ACT inhaler, Inhale 1-2 puffs into the lungs every 6 (six) hours as needed., Disp: 8 g, Rfl: 0   doxycycline (VIBRA-TABS) 100 MG tablet, Take 1 tablet (100 mg total) by mouth 2 (two) times daily., Disp: 20 tablet, Rfl: 0   promethazine-dextromethorphan (PROMETHAZINE-DM) 6.25-15 MG/5ML syrup, Take 5 mLs by mouth 4 (four) times daily as needed., Disp: 118 mL, Rfl: 0   ciclopirox (PENLAC) 8 % solution, Apply topically at bedtime. Apply over nail and surrounding skin. Apply daily over previous coat. After seven (7) days, may remove with alcohol and continue cycle., Disp: 6.6 mL, Rfl: 0   ibuprofen (ADVIL) 600 MG tablet, Take 1 tablet (600 mg total) by mouth every 6 (six) hours., Disp: 30 tablet, Rfl: 0   ketoconazole (NIZORAL) 2 % cream, Apply 1 Application topically daily., Disp: 60 g, Rfl: 2   loratadine (CLARITIN) 10 MG tablet, , Disp: , Rfl:    NP THYROID 30 MG tablet, TAKE 1 TABLET(30 MG) BY MOUTH DAILY BEFORE BREAKFAST, Disp: 90 tablet, Rfl: 1   omeprazole (PRILOSEC OTC) 20 MG tablet, Take 1 tablet (20 mg total) by mouth daily., Disp: 30 tablet, Rfl: 3   ondansetron (ZOFRAN-ODT) 8 MG disintegrating tablet, Take 8 mg by mouth 2 (two) times daily as needed., Disp:  , Rfl:    Medications ordered in this encounter:  Meds ordered this encounter  Medications   doxycycline (VIBRA-TABS) 100 MG tablet    Sig: Take 1 tablet (100 mg total) by mouth 2 (two) times daily.    Dispense:  20 tablet    Refill:  0    Order Specific Question:   Supervising Provider    Answer:   Merrilee Jansky [id number]   promethazine-dextromethorphan (PROMETHAZINE-DM) 6.25-15 MG/5ML syrup    Sig: Take 5 mLs by mouth 4 (four) times daily as needed.    Dispense:  118 mL    Refill:  0    Order Specific Question:   Supervising Provider    Answer:   Merrilee Jansky [[id number]]   albuterol (VENTOLIN HFA) 108 (90 Base) MCG/ACT inhaler    Sig: Inhale 1-2 puffs into the lungs every 6 (six) hours as needed.    Dispense:  8 g    Refill:  0    Order Specific Question:   Supervising Provider    Answer:   Merrilee Jansky [id number]     *If you need refills on other medications prior to your next appointment, please contact your pharmacy*  Follow-Up: Call back or seek an in-person evaluation if the symptoms worsen or if the condition fails to improve as anticipated.  [facility] [phone]  Other Instructions Acute Bronchitis, Adult  Acute bronchitis is sudden inflammation of the main airways (bronchi) that come off the windpipe (trachea)  in the lungs. The swelling causes the airways to get smaller and make more mucus than normal. This can make it hard to breathe and can cause coughing or noisy breathing (wheezing). Acute bronchitis may last several weeks. The cough may last longer. Allergies, asthma, and exposure to smoke may make the condition worse. What are the causes? This condition can be caused by germs and by substances that irritate the lungs, including: Cold and flu viruses. The most common cause of this condition is the virus that causes the common cold. Bacteria. This is less common. Breathing in substances that irritate the lungs, including: Smoke  from cigarettes and other forms of tobacco. Dust and pollen. Fumes from household cleaning products, gases, or burned fuel. Indoor or outdoor air pollution. What increases the risk? The following factors may make you more likely to develop this condition: A weak body's defense system, also called the immune system. A condition that affects your lungs and breathing, such as asthma. What are the signs or symptoms? Common symptoms of this condition include: Coughing. This may bring up clear, yellow, or green mucus from your lungs (sputum). Wheezing. Runny or stuffy nose. Having too much mucus in your lungs (chest congestion). Shortness of breath. Aches and pains, including sore throat or chest. How is this diagnosed? This condition is usually diagnosed based on: Your symptoms and medical history. A physical exam. You may also have other tests, including tests to rule out other conditions, such as pneumonia. These tests include: A test of lung function. Test of a mucus sample to look for the presence of bacteria. Tests to check the oxygen level in your blood. Blood tests. Chest X-ray. How is this treated? Most cases of acute bronchitis clear up over time without treatment. Your health care provider may recommend: Drinking more fluids to help thin your mucus so it is easier to cough up. Taking inhaled medicine (inhaler) to improve air flow in and out of your lungs. Using a vaporizer or a humidifier. These are machines that add water to the air to help you breathe better. Taking a medicine that thins mucus and clears congestion (expectorant). Taking a medicine that prevents or stops coughing (cough suppressant). It is not common to take an antibiotic medicine for this condition. Follow these instructions at home:  Take over-the-counter and prescription medicines only as told by your health care provider. Use an inhaler, vaporizer, or humidifier as told by your health care  provider. Take two teaspoons (10 mL) of honey at bedtime to lessen coughing at night. Drink enough fluid to keep your urine pale yellow. Do not use any products that contain nicotine or tobacco. These products include cigarettes, chewing tobacco, and vaping devices, such as e-cigarettes. If you need help quitting, ask your health care provider. Get plenty of rest. Return to your normal activities as told by your health care provider. Ask your health care provider what activities are safe for you. Keep all follow-up visits. This is important. How is this prevented? To lower your risk of getting this condition again: Wash your hands often with soap and water for at least 20 seconds. If soap and water are not available, use hand sanitizer. Avoid contact with people who have cold symptoms. Try not to touch your mouth, nose, or eyes with your hands. Avoid breathing in smoke or chemical fumes. Breathing smoke or chemical fumes will make your condition worse. Get the flu shot every year. Contact a health care provider if: Your symptoms do not  improve after 2 weeks. You have trouble coughing up the mucus. Your cough keeps you awake at night. You have a fever. Get help right away if you: Cough up blood. Feel pain in your chest. Have severe shortness of breath. Faint or keep feeling like you are going to faint. Have a severe headache. Have a fever or chills that get worse. These symptoms may represent a serious problem that is an emergency. Do not wait to see if the symptoms will go away. Get medical help right away. Call your local emergency services (911 in the [place] Do not drive yourself to the hospital. Summary Acute bronchitis is inflammation of the main airways (bronchi) that come off the windpipe (trachea) in the lungs. The swelling causes the airways to get smaller and make more mucus than normal. Drinking more fluids can help thin your mucus so it is easier to cough up. Take  over-the-counter and prescription medicines only as told by your health care provider. Do not use any products that contain nicotine or tobacco. These products include cigarettes, chewing tobacco, and vaping devices, such as e-cigarettes. If you need help quitting, ask your health care provider. Contact a health care provider if your symptoms do not improve after 2 weeks. This information is not intended to replace advice given to you by your health care provider. Make sure you discuss any questions you have with your health care provider. Document Revised: 01/18/2022 Document Reviewed: 02/08/2021 Elsevier Patient Education  2024 Elsevier Inc.    If you have been instructed to have an in-person evaluation today at a local Urgent Care facility, please use the link below. It will take you to a list of all of our available [facility], including address, phone number and hours of operation. Please do not delay care.  [facility]  If you or a family member do not have a primary care provider, use the link below to schedule a visit and establish care. When you choose a [facility] primary care physician or advanced practice provider, you gain a long-term partner in health. Find a Primary Care Provider  Learn more about [facility] in-office and virtual care options: [facility] - Get Care Now

## 2023-08-28 ENCOUNTER — Encounter: Payer: Self-pay | Admitting: Physician Assistant

## 2023-08-28 ENCOUNTER — Other Ambulatory Visit: Payer: BC Managed Care – PPO

## 2023-09-23 ENCOUNTER — Telehealth: Payer: BC Managed Care – PPO | Admitting: Physician Assistant

## 2023-09-23 ENCOUNTER — Other Ambulatory Visit (HOSPITAL_BASED_OUTPATIENT_CLINIC_OR_DEPARTMENT_OTHER): Payer: Self-pay

## 2023-09-23 ENCOUNTER — Encounter: Payer: BC Managed Care – PPO | Admitting: Physician Assistant

## 2023-09-23 DIAGNOSIS — J069 Acute upper respiratory infection, unspecified: Secondary | ICD-10-CM

## 2023-09-23 DIAGNOSIS — M545 Low back pain, unspecified: Secondary | ICD-10-CM | POA: Diagnosis not present

## 2023-09-23 DIAGNOSIS — B9689 Other specified bacterial agents as the cause of diseases classified elsewhere: Secondary | ICD-10-CM | POA: Diagnosis not present

## 2023-09-23 MED ORDER — CYCLOBENZAPRINE HCL 10 MG PO TABS
10.0000 mg | ORAL_TABLET | Freq: Every evening | ORAL | 0 refills | Status: DC | PRN
Start: 2023-09-23 — End: 2023-12-03
  Filled 2023-09-23: qty 10, 10d supply, fill #0

## 2023-09-23 MED ORDER — CEFDINIR 300 MG PO CAPS
300.0000 mg | ORAL_CAPSULE | Freq: Two times a day (BID) | ORAL | 0 refills | Status: DC
Start: 2023-09-23 — End: 2023-12-03
  Filled 2023-09-23: qty 14, 7d supply, fill #0

## 2023-09-23 NOTE — Patient Instructions (Addendum)
Sarah [name], thank you for joining Piedad Climes, PA-C for today's virtual visit.  While this provider is not your primary care provider (PCP), if your PCP is located in our provider database this encounter information will be shared with them immediately following your visit.   A [facility] MyChart account gives you access to today's visit and all your visits, tests, and labs performed at [facility] " click here if you don't have a [facility] MyChart account or go to [email or web address]  Consent: (Patient) Sarah [name] provided verbal consent for this virtual visit at the beginning of the encounter.  Current Medications:  Current Outpatient Medications:    albuterol (VENTOLIN HFA) 108 (90 Base) MCG/ACT inhaler, Inhale 1-2 puffs into the lungs every 6 (six) hours as needed., Disp: 8.5 g, Rfl: 0   ciclopirox (PENLAC) 8 % solution, Apply topically at bedtime. Apply over nail and surrounding skin. Apply daily over previous coat. After seven (7) days, may remove with alcohol and continue cycle., Disp: 6.6 mL, Rfl: 0   doxycycline (VIBRA-TABS) 100 MG tablet, Take 1 tablet (100 mg total) by mouth 2 (two) times daily., Disp: 20 tablet, Rfl: 0   ibuprofen (ADVIL) 600 MG tablet, Take 1 tablet (600 mg total) by mouth every 6 (six) hours., Disp: 30 tablet, Rfl: 0   ketoconazole (NIZORAL) 2 % cream, Apply 1 Application topically daily., Disp: 60 g, Rfl: 2   loratadine (CLARITIN) 10 MG tablet, , Disp: , Rfl:    NP THYROID 30 MG tablet, TAKE 1 TABLET(30 MG) BY MOUTH DAILY BEFORE BREAKFAST, Disp: 90 tablet, Rfl: 1   omeprazole (PRILOSEC OTC) 20 MG tablet, Take 1 tablet (20 mg total) by mouth daily., Disp: 30 tablet, Rfl: 3   ondansetron (ZOFRAN-ODT) 8 MG disintegrating tablet, Take 8 mg by mouth 2 (two) times daily as needed., Disp: , Rfl:    promethazine-dextromethorphan (PROMETHAZINE-DM) 6.25-15 MG/5ML syrup, Take 5 mLs by mouth 4 (four) times daily as needed., Disp: 118  mL, Rfl: 0   Medications ordered in this encounter:  No orders of the defined types were placed in this encounter.    *If you need refills on other medications prior to your next appointment, please contact your pharmacy*  Follow-Up: Call back or seek an in-person evaluation if the symptoms worsen or if the condition fails to improve as anticipated.  [facility] [phone]  Other Instructions Take antibiotic (Cefdinir) as directed.  Increase fluids.  Get plenty of rest. Use Mucinex for congestion. Use cough syrup as directed. Take a daily probiotic (I recommend Align or Culturelle, but even Activia Yogurt may be beneficial).  A humidifier placed in the bedroom may offer some relief for a dry, scratchy throat of nasal irritation.  Read information below on acute bronchitis.   Also, consider switching out your Allegra for OTC xyzal for a few months as discussed.  For the low back, avoid heavy lifting and overexertion. Continue heating pad in 10-15 minute intervals. Can use OTC Tylenol and Ibuprofen as needed. Save Flexeril for evening use when you will be at home relaxing. Do not take medicine and drive.   If not resolving or any new/worsening symptoms despite treatment, you will need to seek an in-person evaluation.   Acute Bronchitis Bronchitis is when the airways that extend from the windpipe into the lungs get red, puffy, and painful (inflamed). Bronchitis often causes thick spit (mucus) to develop. This leads to a cough. A cough is the most common symptom  of bronchitis. In acute bronchitis, the condition usually begins suddenly and goes away over time (usually in 2 weeks). Smoking, allergies, and asthma can make bronchitis worse. Repeated episodes of bronchitis may cause more lung problems.  HOME CARE Rest. Drink enough fluids to keep your pee (urine) clear or pale yellow (unless you need to limit fluids as told by your doctor). Only take over-the-counter or  prescription medicines as told by your doctor. Avoid smoking and secondhand smoke. These can make bronchitis worse. If you are a smoker, think about using nicotine gum or skin patches. Quitting smoking will help your lungs heal faster. Reduce the chance of getting bronchitis again by: Washing your hands often. Avoiding people with cold symptoms. Trying not to touch your hands to your mouth, nose, or eyes. Follow up with your doctor as told.  GET HELP IF: Your symptoms do not improve after 1 week of treatment. Symptoms include: Cough. Fever. Coughing up thick spit. Body aches. Chest congestion. Chills. Shortness of breath. Sore throat.  GET HELP RIGHT AWAY IF:  You have an increased fever. You have chills. You have severe shortness of breath. You have bloody thick spit (sputum). You throw up (vomit) often. You lose too much body fluid (dehydration). You have a severe headache. You faint.  MAKE SURE YOU:  Understand these instructions. Will watch your condition. Will get help right away if you are not doing well or get worse. Document Released: 03/26/2008 Document Revised: 06/10/2013 Document Reviewed: 03/31/2013 [facility] Patient Information 2015 [place], [place]. This information is not intended to replace advice given to you by your health care provider. Make sure you discuss any questions you have with your health care provider.    If you have been instructed to have an in-person evaluation today at a local Urgent Care facility, please use the link below. It will take you to a list of all of our available [facility], including address, phone number and hours of operation. Please do not delay care.  [facility]  If you or a family member do not have a primary care provider, use the link below to schedule a visit and establish care. When you choose a [facility] primary care physician or advanced practice provider, you gain a long-term partner in  health. Find a Primary Care Provider  Learn more about [facility] in-office and virtual care options:  - Get Care Now

## 2023-09-23 NOTE — Progress Notes (Signed)
Virtual Visit Consent   Sarah [name], you are scheduled for a virtual visit with a [facility] provider today. Just as with appointments in the office, your consent must be obtained to participate. Your consent will be active for this visit and any virtual visit you may have with one of our providers in the next 365 days. If you have a MyChart account, a copy of this consent can be sent to you electronically.  As this is a virtual visit, video technology does not allow for your provider to perform a traditional examination. This may limit your provider's ability to fully assess your condition. If your provider identifies any concerns that need to be evaluated in person or the need to arrange testing (such as labs, EKG, etc.), we will make arrangements to do so. Although advances in technology are sophisticated, we cannot ensure that it will always work on either your end or our end. If the connection with a video visit is poor, the visit may have to be switched to a telephone visit. With either a video or telephone visit, we are not always able to ensure that we have a secure connection.  By engaging in this virtual visit, you consent to the provision of healthcare and authorize for your insurance to be billed (if applicable) for the services provided during this visit. Depending on your insurance coverage, you may receive a charge related to this service.  I need to obtain your verbal consent now. Are you willing to proceed with your visit today? Sarah [name] has provided verbal consent on 09/23/2023 for a virtual visit (video or telephone). Piedad Climes, [place]  Date: 09/23/2023 10:31 AM  Virtual Visit via Video Note   I, Piedad Climes, connected with  Sarah [name]  ([id number], Feb 21, 1991) on 09/23/23 at 10:15 AM EST by a video-enabled telemedicine application and verified that I am speaking with the correct person using two identifiers.  Location: Patient: Virtual Visit  Location Patient: Home Provider: Virtual Visit Location Provider: Home Office   I discussed the limitations of evaluation and management by telemedicine and the availability of in person appointments. The patient expressed understanding and agreed to proceed.    History of Present Illness: Sarah [name] is a 32 y.o. who identifies as a female who was assigned female at birth, and is being seen today for multiple complaints.   Patient endorses over the past week with URI symptoms starting as post nasal drainage and nasal congestion that then moved into her chest with substantial chest congestion, cough, and chest tightness. Cough is productive of thick phlegm and colored. Denies sinus pain or tooth pain. Had similar illness about a month ago and does not feel it fully resolved. Also suffers from some seasonal allergies for which she takes Allegra daily, Flonase as needed.   Patient also noted pulling a muscle in her back yesterday while leaning down to pick up her little one. Noted pain when raising up. Notes pain is in left lower back and into upper buttock. Denies radiation into lower extremity or any numbness, tingling or weakness. Has applied ice and heat to the area. Has also tried her home TENS unit with only some improvement. Pain is currently 4-5/10.  HPI: HPI  Problems:  Patient Active Problem List   Diagnosis Date Noted   Normal labor 05/02/2023   Anxiety and depression 11/09/2021   Gallstones 07/24/2019   Hypothyroidism 03/31/2019    Allergies:  Allergies  Allergen Reactions   Azithromycin  GI-- stomach cramps   Penicillins Hives    Did it involve swelling of the face/tongue/throat, SOB, or low BP? No Did it involve sudden or severe rash/hives, skin peeling, or any reaction on the inside of your mouth or nose? No Did you need to seek medical attention at a hospital or doctor's office? No When did it last happen?    3-4 years ago   If all above answers are "NO", may  proceed with cephalosporin use.    Medications:  Current Outpatient Medications:    cefdinir (OMNICEF) 300 MG capsule, Take 1 capsule (300 mg total) by mouth 2 (two) times daily., Disp: 14 capsule, Rfl: 0   cyclobenzaprine (FLEXERIL) 10 MG tablet, Take 1 tablet (10 mg total) by mouth at bedtime as needed., Disp: 10 tablet, Rfl: 0   albuterol (VENTOLIN HFA) 108 (90 Base) MCG/ACT inhaler, Inhale 1-2 puffs into the lungs every 6 (six) hours as needed., Disp: 8.5 g, Rfl: 0   ciclopirox (PENLAC) 8 % solution, Apply topically at bedtime. Apply over nail and surrounding skin. Apply daily over previous coat. After seven (7) days, may remove with alcohol and continue cycle., Disp: 6.6 mL, Rfl: 0   ketoconazole (NIZORAL) 2 % cream, Apply 1 Application topically daily., Disp: 60 g, Rfl: 2   NP THYROID 30 MG tablet, TAKE 1 TABLET(30 MG) BY MOUTH DAILY BEFORE BREAKFAST, Disp: 90 tablet, Rfl: 1   omeprazole (PRILOSEC OTC) 20 MG tablet, Take 1 tablet (20 mg total) by mouth daily., Disp: 30 tablet, Rfl: 3   promethazine-dextromethorphan (PROMETHAZINE-DM) 6.25-15 MG/5ML syrup, Take 5 mLs by mouth 4 (four) times daily as needed., Disp: 118 mL, Rfl: 0  Observations/Objective: Patient is well-developed, well-nourished in no acute distress.  Resting comfortably at home.  Head is normocephalic, atraumatic.  No labored breathing. Speech is clear and coherent with logical content.  Patient is alert and oriented at baseline.   Assessment and Plan: 1. Bacterial upper respiratory infection - cefdinir (OMNICEF) 300 MG capsule; Take 1 capsule (300 mg total) by mouth 2 (two) times daily.  Dispense: 14 capsule; Refill: 0  Intolerant to Azithromycin. Allergic to Penicillins but tolerates Cephalosporins. Was on Doxycycline a few weeks ago so want to avoid re prescribing this. Avoids FQs. Will prescribe Cefdinir BID. Supportive measures and OTC medications reviewed. Promethazine-DM per orders.  2. Acute left-sided low  back pain without sciatica - cyclobenzaprine (FLEXERIL) 10 MG tablet; Take 1 tablet (10 mg total) by mouth at bedtime as needed.  Dispense: 10 tablet; Refill: 0  Supportive measures and OTC medications reviewed with patient. Ok to continue heating pad. Cyclobenzaprine in the evening as needed, as discussed. In person follow-up precautions reviewed. Work note provided.   Follow Up Instructions: I discussed the assessment and treatment plan with the patient. The patient was provided an opportunity to ask questions and all were answered. The patient agreed with the plan and demonstrated an understanding of the instructions.  A copy of instructions were sent to the patient via MyChart unless otherwise noted below.   The patient was advised to call back or seek an in-person evaluation if the symptoms worsen or if the condition fails to improve as anticipated.    Piedad Climes, PA-C

## 2023-09-23 NOTE — Progress Notes (Signed)
Patient seen via video visit as she scheduled at same time.

## 2023-09-24 ENCOUNTER — Encounter: Payer: Self-pay | Admitting: Family Medicine

## 2023-12-01 ENCOUNTER — Encounter: Payer: Self-pay | Admitting: Family Medicine

## 2023-12-02 ENCOUNTER — Ambulatory Visit: Payer: Self-pay | Admitting: Family Medicine

## 2023-12-02 NOTE — Telephone Encounter (Signed)
   Chief Complaint: dizziness Symptoms: lightheaded; nausea Frequency: comes and goes  Disposition: [] ED /[] Urgent Care (no appt availability in office) / [x] Appointment(In office/virtual)/ []  [facility]/ [] Home Care/ [] Refused Recommended Disposition /[] [facility]/ []  Follow-up with PCP Additional Notes: Pt calling with complaints of moderate dizziness for the last 3 or 4 days. Pt feels looking at computer screens for work triggers this. Pt states "it feels like my eyes aren't tracking and it makes me dizzy." Pt states her mom had this same sensation last week so not sure if this is viral.  Pt is also fatigued. Per protocol, pt to be seen within 24 hours. Pt has appt with PCP tomorrow morning at 0840. RN gave care advice. Pt verbalized understanding.          Copied from CRM [id number]. Topic: Clinical - Red Word Triage >> Dec 02, 2023  4:24 PM Juvenal Opoka wrote: Red Word that prompted transfer to Nurse Triage: Patient called in regarding scheduling a sit appointment, patients symptoms are dizziness/ loss of balance, fatigue, as well as possible vertigo episode and some nausea ongoing for the last 3 days Reason for Disposition  [1] MODERATE dizziness (e.g., vertigo; feels very unsteady, interferes with normal activities) AND [2] has NOT been evaluated by doctor (or NP/PA) for this  Answer Assessment - Initial Assessment Questions 1. DESCRIPTION: "Describe your dizziness."     Feels like my eyes aren't tracking and makes me dizzy 2. VERTIGO: "Do you feel like either you or the room is spinning or tilting?"      yes 3. LIGHTHEADED: "Do you feel lightheaded?" (e.g., somewhat faint, woozy, weak upon standing)     yes 4. SEVERITY: "How bad is it?"  "Can you walk?"   - MILD: Feels slightly dizzy and unsteady, but is walking normally.   - MODERATE: Feels unsteady when walking, but not falling; interferes with normal activities (e.g., school, work).   - SEVERE: Unable to  walk without falling, or requires assistance to walk without falling.     moderate 5. ONSET:  "When did the dizziness begin?"     3-4 days ago  6. AGGRAVATING FACTORS: "Does anything make it worse?" (e.g., standing, change in head position)     Looking a screens makes it worse 7. CAUSE: "What do you think is causing the dizziness?"     Unsure  8. RECURRENT SYMPTOM: "Have you had dizziness before?" If Yes, ask: "When was the last time?" "What happened that time?"     No  9. OTHER SYMPTOMS: "Do you have any other symptoms?" (e.g., headache, weakness, numbness, vomiting, earache)     Fatigue and nausea  10. PREGNANCY: "Is there any chance you are pregnant?" "When was your last menstrual period?"       no  Protocols used: Dizziness - Vertigo-A-AH

## 2023-12-03 ENCOUNTER — Other Ambulatory Visit (HOSPITAL_BASED_OUTPATIENT_CLINIC_OR_DEPARTMENT_OTHER): Payer: Self-pay

## 2023-12-03 ENCOUNTER — Encounter: Payer: Self-pay | Admitting: Family Medicine

## 2023-12-03 ENCOUNTER — Ambulatory Visit: Payer: BC Managed Care – PPO | Admitting: Family Medicine

## 2023-12-03 VITALS — BP 128/74 | HR 82 | Temp 97.9°F | Ht 64.0 in

## 2023-12-03 DIAGNOSIS — R42 Dizziness and giddiness: Secondary | ICD-10-CM | POA: Diagnosis not present

## 2023-12-03 DIAGNOSIS — E039 Hypothyroidism, unspecified: Secondary | ICD-10-CM | POA: Diagnosis not present

## 2023-12-03 LAB — CBC WITH DIFFERENTIAL/PLATELET
Basophils Absolute: 0 10*3/uL (ref 0.0–0.1)
Basophils Relative: 0.3 % (ref 0.0–3.0)
Eosinophils Absolute: 0.1 10*3/uL (ref 0.0–0.7)
Eosinophils Relative: 2.5 % (ref 0.0–5.0)
HCT: 40.5 % (ref 36.0–46.0)
Hemoglobin: 13.3 g/dL (ref 12.0–15.0)
Lymphocytes Relative: 44.9 % (ref 12.0–46.0)
Lymphs Abs: 2.2 10*3/uL (ref 0.7–4.0)
MCHC: 32.8 g/dL (ref 30.0–36.0)
MCV: 81.2 fL (ref 78.0–100.0)
Monocytes Absolute: 0.5 10*3/uL (ref 0.1–1.0)
Monocytes Relative: 10.5 % (ref 3.0–12.0)
Neutro Abs: 2 10*3/uL (ref 1.4–7.7)
Neutrophils Relative %: 41.8 % — ABNORMAL LOW (ref 43.0–77.0)
Platelets: 269 10*3/uL (ref 150.0–400.0)
RBC: 4.99 Mil/uL (ref 3.87–5.11)
RDW: 15.5 % (ref 11.5–15.5)
WBC: 4.8 10*3/uL (ref 4.0–10.5)

## 2023-12-03 LAB — BASIC METABOLIC PANEL
BUN: 12 mg/dL (ref 6–23)
CO2: 28 meq/L (ref 19–32)
Calcium: 9.1 mg/dL (ref 8.4–10.5)
Chloride: 104 meq/L (ref 96–112)
Creatinine, Ser: 0.67 mg/dL (ref 0.40–1.20)
GFR: 114.75 mL/min (ref >60.00)
Glucose, Bld: 80 mg/dL (ref 70–99)
Potassium: 4 meq/L (ref 3.5–5.1)
Sodium: 138 meq/L (ref 135–145)

## 2023-12-03 LAB — TSH: TSH: 1.92 u[IU]/mL (ref 0.35–5.50)

## 2023-12-03 MED ORDER — ONDANSETRON HCL 4 MG PO TABS
4.0000 mg | ORAL_TABLET | Freq: Three times a day (TID) | ORAL | 0 refills | Status: DC | PRN
  Filled 2023-12-03: qty 30, 10d supply, fill #0

## 2023-12-03 MED ORDER — MECLIZINE HCL 25 MG PO TABS
25.0000 mg | ORAL_TABLET | Freq: Three times a day (TID) | ORAL | 0 refills | Status: DC | PRN
  Filled 2023-12-03: qty 30, 10d supply, fill #0

## 2023-12-03 MED ORDER — PREDNISONE 10 MG PO TABS
ORAL_TABLET | ORAL | 0 refills | Status: AC
  Filled 2023-12-03: qty 18, 9d supply, fill #0

## 2023-12-03 NOTE — Patient Instructions (Signed)
Follow up as needed or as scheduled We'll notify you of your lab results and make any changes if needed USE the Meclizine as needed for dizziness TAKE the Zofran as needed for nausea START the Prednisone as directed- 3 pills at the same time x3 days, then 2 pills at the same time x3 days, then 1 pill daily.  Take w/ food  Drink LOTS of fluids Change positions SLOWLY to allow yourself time to adjust Do the Epley maneuver as directed Call with any questions or concerns Hang in there!!!

## 2023-12-03 NOTE — Telephone Encounter (Signed)
Patient seen this morning with Dr Beverely Low

## 2023-12-03 NOTE — Progress Notes (Signed)
   Subjective:    Patient ID: Sarah [name], female    DOB: 10/16/1991, 33 y.o.   MRN: [id number]  HPI Dizziness- pt reports intermittent vertigo sxs 'which I've never had before'.  Pt feels that she has been more sensitive to movement/motion sickness since giving birth in July.  In the last week she has had 'constant dizziness'.  When sitting, she feels 'off balance'.  With movement, has spinning and nausea.  + fatigue.  No fever.  + R ear pain.  + sinus pressure.  No recent head injury.  Hypothyroid- chronic problem.  On NP thyroid 30mg  daily.  Has not had TSH done since giving birth.   Review of Systems For ROS see HPI     Objective:   Physical Exam Vitals reviewed.  Constitutional:      General: She is not in acute distress.    Appearance: Normal appearance. She is well-developed. She is not ill-appearing.  HENT:     Head: Normocephalic and atraumatic.     Right Ear: Tympanic membrane and ear canal normal.     Mouth/Throat:     Pharynx: Uvula midline.  Eyes:     Conjunctiva/sclera: Conjunctivae normal.     Pupils: Pupils are equal, round, and reactive to light.     Comments: 2-3 beats of horizontal nystagmus when looking L  Cardiovascular:     Rate and Rhythm: Normal rate and regular rhythm.     Heart sounds: Normal heart sounds.  Pulmonary:     Effort: Pulmonary effort is normal. No respiratory distress.     Breath sounds: Normal breath sounds. No wheezing or rales.  Musculoskeletal:     Cervical back: Normal range of motion and neck supple.  Lymphadenopathy:     Cervical: No cervical adenopathy.  Skin:    General: Skin is warm and dry.  Neurological:     Mental Status: She is alert and oriented to person, place, and time.     Cranial Nerves: No cranial nerve deficit.     Deep Tendon Reflexes: Reflexes are normal and symmetric.     Comments: + Gilberto Better  Psychiatric:        Behavior: Behavior normal.        Thought Content: Thought content normal.         Judgment: Judgment normal.           Assessment & Plan:   Vertigo- new.  Pt denies similar sxs.  + family hx.  Sxs started ~1 week ago and are constant.  Worse w/ turning her head or movement.  + nausea.  Check labs.  Start Meclizine and Zofran prn.  Start prednisone taper in case of labyrinthitis.  Encouraged increased fluids and changing position slowly.  If no improvement will need ENT referral.  Pt expressed understanding and is in agreement w/ plan.

## 2023-12-04 ENCOUNTER — Encounter: Payer: Self-pay | Admitting: Family Medicine

## 2023-12-04 DIAGNOSIS — H814 Vertigo of central origin: Secondary | ICD-10-CM

## 2023-12-04 DIAGNOSIS — R42 Dizziness and giddiness: Secondary | ICD-10-CM

## 2023-12-06 NOTE — Telephone Encounter (Signed)
Patient complains of worsening sxs, I saw the AVS says to follow up but wanted to see if there was anything to advise with the weekend causing delay in follow up availability

## 2023-12-06 NOTE — Addendum Note (Signed)
Addended by: Sheliah Hatch on: 12/06/2023 01:01 PM   Modules accepted: Orders

## 2023-12-10 NOTE — Telephone Encounter (Signed)
 Patient notes new onset sxs please advise

## 2023-12-10 NOTE — Telephone Encounter (Signed)
 Patient can not get an appointment till may 1st and she is wondering if there is anything she can do in the mean time.

## 2023-12-11 ENCOUNTER — Other Ambulatory Visit: Payer: Self-pay | Admitting: Family Medicine

## 2023-12-11 ENCOUNTER — Other Ambulatory Visit (HOSPITAL_BASED_OUTPATIENT_CLINIC_OR_DEPARTMENT_OTHER): Payer: Self-pay

## 2023-12-11 MED ORDER — MECLIZINE HCL 25 MG PO TABS
25.0000 mg | ORAL_TABLET | Freq: Three times a day (TID) | ORAL | 0 refills | Status: DC | PRN
  Filled 2023-12-11: qty 30, 10d supply, fill #0

## 2023-12-11 NOTE — Addendum Note (Signed)
 Addended by: Sheliah Hatch on: 12/11/2023 07:55 AM   Modules accepted: Orders

## 2023-12-11 NOTE — Addendum Note (Signed)
 Addended by: Eldred Manges on: 12/11/2023 08:16 AM   Modules accepted: Orders

## 2023-12-11 NOTE — Telephone Encounter (Signed)
 Called patient to ensure she is informed she states tingling went away and it is very mild, she is willing to go to ED if it gets worse, would like to proceed with outpatient CT as well  Also notes she needs a refill on Meclizine as she has been taking 50 mg dose as directed  Requested Prescriptions   Pending Prescriptions Disp Refills   meclizine (ANTIVERT) 25 MG tablet 30 tablet 0    Sig: Take 1 tablet (25 mg total) by mouth 3 (three) times daily as needed for dizziness.     Date of patient request: 12/11/2023 Last office visit: 12/03/2023 Upcoming visit: 12/11/2023 Date of last refill: 12/03/2023 Last refill amount: 30

## 2023-12-13 ENCOUNTER — Other Ambulatory Visit: Payer: Self-pay | Admitting: Family Medicine

## 2023-12-13 DIAGNOSIS — E039 Hypothyroidism, unspecified: Secondary | ICD-10-CM

## 2023-12-17 ENCOUNTER — Ambulatory Visit (HOSPITAL_BASED_OUTPATIENT_CLINIC_OR_DEPARTMENT_OTHER)
Admission: RE | Admit: 2023-12-17 | Discharge: 2023-12-17 | Disposition: A | Discharge status: Home / Self Care | Payer: BC Managed Care – PPO | Source: Ambulatory Visit | Attending: Family Medicine | Admitting: Family Medicine

## 2023-12-17 DIAGNOSIS — R42 Dizziness and giddiness: Secondary | ICD-10-CM | POA: Diagnosis present

## 2023-12-17 DIAGNOSIS — H814 Vertigo of central origin: Secondary | ICD-10-CM | POA: Insufficient documentation

## 2023-12-18 ENCOUNTER — Encounter: Payer: Self-pay | Admitting: Family Medicine

## 2023-12-18 DIAGNOSIS — R42 Dizziness and giddiness: Secondary | ICD-10-CM

## 2023-12-19 NOTE — Telephone Encounter (Signed)
 Pt notes no questions from the CT scan result but does want to know what to do about continued sxs.

## 2024-01-01 ENCOUNTER — Other Ambulatory Visit (HOSPITAL_BASED_OUTPATIENT_CLINIC_OR_DEPARTMENT_OTHER): Payer: Self-pay

## 2024-01-29 ENCOUNTER — Encounter: Payer: Self-pay | Admitting: Family Medicine

## 2024-01-29 ENCOUNTER — Ambulatory Visit: Admitting: Family Medicine

## 2024-01-29 ENCOUNTER — Other Ambulatory Visit (HOSPITAL_BASED_OUTPATIENT_CLINIC_OR_DEPARTMENT_OTHER): Payer: Self-pay

## 2024-01-29 VITALS — BP 122/78 | HR 78 | Temp 98.4°F | Ht 65.0 in | Wt 198.4 lb

## 2024-01-29 DIAGNOSIS — E039 Hypothyroidism, unspecified: Secondary | ICD-10-CM | POA: Diagnosis not present

## 2024-01-29 DIAGNOSIS — E669 Obesity, unspecified: Secondary | ICD-10-CM

## 2024-01-29 LAB — T3, FREE: T3, Free: 3 pg/mL (ref 2.3–4.2)

## 2024-01-29 LAB — CBC WITH DIFFERENTIAL/PLATELET
Basophils Absolute: 0 10*3/uL (ref 0.0–0.1)
Basophils Relative: 0.3 % (ref 0.0–3.0)
Eosinophils Absolute: 0.2 10*3/uL (ref 0.0–0.7)
Eosinophils Relative: 3.3 % (ref 0.0–5.0)
HCT: 43.1 % (ref 36.0–46.0)
Hemoglobin: 14.2 g/dL (ref 12.0–15.0)
Lymphocytes Relative: 48.8 % — ABNORMAL HIGH (ref 12.0–46.0)
Lymphs Abs: 2.8 10*3/uL (ref 0.7–4.0)
MCHC: 32.8 g/dL (ref 30.0–36.0)
MCV: 82.5 fl (ref 78.0–100.0)
Monocytes Absolute: 0.5 10*3/uL (ref 0.1–1.0)
Monocytes Relative: 9 % (ref 3.0–12.0)
Neutro Abs: 2.2 10*3/uL (ref 1.4–7.7)
Neutrophils Relative %: 38.6 % — ABNORMAL LOW (ref 43.0–77.0)
Platelets: 281 10*3/uL (ref 150.0–400.0)
RBC: 5.23 Mil/uL — ABNORMAL HIGH (ref 3.87–5.11)
RDW: 15 % (ref 11.5–15.5)
WBC: 5.7 10*3/uL (ref 4.0–10.5)

## 2024-01-29 LAB — BASIC METABOLIC PANEL WITH GFR
BUN: 14 mg/dL (ref 6–23)
CO2: 27 meq/L (ref 19–32)
Calcium: 9.7 mg/dL (ref 8.4–10.5)
Chloride: 104 meq/L (ref 96–112)
Creatinine, Ser: 0.71 mg/dL (ref 0.40–1.20)
GFR: 111.5 mL/min (ref >60.00)
Glucose, Bld: 89 mg/dL (ref 70–99)
Potassium: 4.7 meq/L (ref 3.5–5.1)
Sodium: 138 meq/L (ref 135–145)

## 2024-01-29 LAB — HEPATIC FUNCTION PANEL
ALT: 16 U/L (ref 0–35)
AST: 17 U/L (ref 0–37)
Albumin: 4.9 g/dL (ref 3.5–5.2)
Alkaline Phosphatase: 68 U/L (ref 39–117)
Bilirubin, Direct: 0.1 mg/dL (ref 0.0–0.3)
Total Bilirubin: 0.4 mg/dL (ref 0.2–1.2)
Total Protein: 7.7 g/dL (ref 6.0–8.3)

## 2024-01-29 LAB — TSH: TSH: 1.76 u[IU]/mL (ref 0.35–5.50)

## 2024-01-29 LAB — LIPID PANEL
Cholesterol: 176 mg/dL (ref 0–200)
HDL: 52.9 mg/dL (ref >39.00)
LDL Cholesterol: 104 mg/dL — ABNORMAL HIGH (ref 0–99)
NonHDL: 123.41
Total CHOL/HDL Ratio: 3
Triglycerides: 96 mg/dL (ref 0.0–149.0)
VLDL: 19.2 mg/dL (ref 0.0–40.0)

## 2024-01-29 LAB — T4, FREE: Free T4: 0.78 ng/dL (ref 0.60–1.60)

## 2024-01-29 LAB — HEMOGLOBIN A1C: Hgb A1c MFr Bld: 5.5 % (ref 4.6–6.5)

## 2024-01-29 LAB — VITAMIN D 25 HYDROXY (VIT D DEFICIENCY, FRACTURES): VITD: 34.98 ng/mL (ref 30.00–100.00)

## 2024-01-29 MED ORDER — PHENTERMINE HCL 37.5 MG PO CAPS
37.5000 mg | ORAL_CAPSULE | ORAL | 0 refills | Status: DC
  Filled 2024-01-29: qty 90, 90d supply, fill #0

## 2024-01-29 NOTE — Progress Notes (Signed)
   Subjective:    Patient ID: Sarah [name], female    DOB: November 16, 1990, 33 y.o.   MRN: [id number]  HPI Obesity- pt report she has been 'really trying' to eat well and exercise for the last 4 months.  Pt is exercising 'at least 6x/week'.  Doing both strength and cardio.  Has cut sugar and decreased carbs.  Has increased intake of protein and fruits and veggies.  Attempted [employer] x2 months but had no success.  Pt is currently eating a 1200 calorie diet.  The only other time she had issues w/ weight was when she was dx'd hypothyroid.  Pt is discouraged.  Current BMI 33.  Not interested in GLP 1 at this point and would prefer something more short term   Review of Systems For ROS see HPI     Objective:   Physical Exam Vitals reviewed.  Constitutional:      General: She is not in acute distress.    Appearance: Normal appearance. She is well-developed. She is obese. She is not ill-appearing.  HENT:     Head: Normocephalic and atraumatic.  Eyes:     Conjunctiva/sclera: Conjunctivae normal.     Pupils: Pupils are equal, round, and reactive to light.  Neck:     Thyroid: No thyromegaly.  Cardiovascular:     Rate and Rhythm: Normal rate and regular rhythm.     Pulses: Normal pulses.     Heart sounds: Normal heart sounds. No murmur heard. Pulmonary:     Effort: Pulmonary effort is normal. No respiratory distress.     Breath sounds: Normal breath sounds.  Abdominal:     General: There is no distension.     Palpations: Abdomen is soft.     Tenderness: There is no abdominal tenderness.  Musculoskeletal:     Cervical back: Normal range of motion and neck supple.     Right lower leg: No edema.     Left lower leg: No edema.  Lymphadenopathy:     Cervical: No cervical adenopathy.  Skin:    General: Skin is warm and dry.  Neurological:     General: No focal deficit present.     Mental Status: She is alert and oriented to person, place, and time.  Psychiatric:        Mood and Affect: Mood  normal.        Behavior: Behavior normal.        Thought Content: Thought content normal.           Assessment & Plan:

## 2024-01-29 NOTE — Assessment & Plan Note (Signed)
 Deteriorated.  Pt was 191 lbs when she became pregnant w/ 2nd child.  Now 198.  BMI 33.  She reports eating around 1200 calories daily, exercising 6x/week, and changing diet to increase protein and fruit/veggie intake.  Despite these healthy behaviors x4 months, she is not losing weight.  She is not interested in GLP 1 at this time and would prefer something more short term.  Will get baseline labs and start Phentermine for 90 days.  Pt expressed understanding and is in agreement w/ plan.

## 2024-01-29 NOTE — Patient Instructions (Signed)
 Follow up in 6 weeks to recheck weight loss progress We'll notify you of your lab results and make any changes if needed Keep up the good work on healthy diet and regular exercise- you're doing great! START the Phentermine once daily in the morning Call with any questions or concerns Stay Safe!  Stay Healthy!

## 2024-01-30 ENCOUNTER — Telehealth: Payer: Self-pay

## 2024-01-30 ENCOUNTER — Telehealth: Payer: Self-pay | Admitting: Pharmacy Technician

## 2024-01-30 ENCOUNTER — Other Ambulatory Visit (HOSPITAL_COMMUNITY): Payer: Self-pay

## 2024-01-30 NOTE — Telephone Encounter (Signed)
 Pharmacy Patient Advocate Encounter  Received notification from [facility] that Prior Authorization for Phentermine HCl 37.5MG  capsules  has been APPROVED from 01/30/24 to 01/29/25. Ran test claim, Copay is $0. This test claim was processed through [facility]- copay amounts may vary at other pharmacies due to pharmacy/plan contracts, or as the patient moves through the different stages of their insurance plan.   PA #/Case ID/Reference #: [id number]

## 2024-01-30 NOTE — Telephone Encounter (Signed)
 Left vm to call office

## 2024-01-30 NOTE — Telephone Encounter (Signed)
 Patient needs PA.Marland Kitchen

## 2024-01-30 NOTE — Telephone Encounter (Signed)
 Pt has reviewed labs via MyChart

## 2024-01-30 NOTE — Telephone Encounter (Signed)
 Pharmacy Patient Advocate Encounter   Received notification from CoverMyMeds that prior authorization for Phentermine HCl 37.5MG  capsules is required/requested.   Insurance verification completed.   The patient is insured through [facility] .   Per test claim: PA required; PA submitted to above mentioned insurance via CoverMyMeds Key/confirmation #/EOC [facility] Status is pending

## 2024-01-30 NOTE — Telephone Encounter (Signed)
-----   Message from Neena Rhymes sent at 01/29/2024  3:46 PM EDT ----- Labs look good!!  Proceed w/ starting Phentermine daily

## 2024-01-31 NOTE — Telephone Encounter (Signed)
 Pt has been notified.

## 2024-02-08 ENCOUNTER — Other Ambulatory Visit (HOSPITAL_BASED_OUTPATIENT_CLINIC_OR_DEPARTMENT_OTHER): Payer: Self-pay

## 2024-02-08 ENCOUNTER — Telehealth: Admitting: Family Medicine

## 2024-02-08 DIAGNOSIS — J069 Acute upper respiratory infection, unspecified: Secondary | ICD-10-CM | POA: Diagnosis not present

## 2024-02-08 MED ORDER — GUAIFENESIN 100 MG/5ML PO LIQD
5.0000 mL | ORAL | 0 refills | Status: AC | PRN
Start: 2024-02-08 — End: 2024-02-15
  Filled 2024-02-08 (×2): qty 118, 4d supply, fill #0
  Filled 2024-02-08: qty 120, 4d supply, fill #0

## 2024-02-08 MED ORDER — BENZONATATE 100 MG PO CAPS
100.0000 mg | ORAL_CAPSULE | Freq: Three times a day (TID) | ORAL | 0 refills | Status: AC | PRN
  Filled 2024-02-08: qty 21, 7d supply, fill #0

## 2024-02-08 NOTE — Progress Notes (Signed)
 Virtual Visit Consent   Sarah [name], you are scheduled for a virtual visit with a Sarah [name] today. Just as with appointments in the office, your consent must be obtained to participate. Your consent will be active for this visit and any virtual visit you may have with one of our providers in the next 365 days. If you have a MyChart account, a copy of this consent can be sent to you electronically.  As this is a virtual visit, video technology does not allow for your [name] to perform a traditional examination. This may limit your [name]'s ability to fully assess your condition. If your [name] identifies any concerns that need to be evaluated in person or the need to arrange testing (such as labs, EKG, etc.), we will make arrangements to do so. Although advances in technology are sophisticated, we cannot ensure that it will always work on either your end or our end. If the connection with a video visit is poor, the visit may have to be switched to a telephone visit. With either a video or telephone visit, we are not always able to ensure that we have a secure connection.  By engaging in this virtual visit, you consent to the provision of healthcare and authorize for your insurance to be billed (if applicable) for the services provided during this visit. Depending on your insurance coverage, you may receive a charge related to this service.  I need to obtain your verbal consent now. Are you willing to proceed with your visit today? Sarah [name] has provided verbal consent on 02/08/2024 for a virtual visit (video or telephone). Sarah [name], [place]  Date: 02/08/2024 11:22 AM   Virtual Visit via Video Note   I, Sarah [name], connected with  Sarah [name]  ([id number], 1991/07/22) on 02/08/24 at 11:15 AM EDT by a video-enabled telemedicine application and verified that I am speaking with the correct person using two identifiers.  Location: Patient: Virtual Visit Location  Patient: Home [name]: Virtual Visit Location [name]: Home Office   I discussed the limitations of evaluation and management by telemedicine and the availability of in person appointments. The patient expressed understanding and agreed to proceed.    History of Present Illness: Sarah [name] is a 33 y.o. who identifies as a female who was assigned female at birth, and is being seen today for c/o allergies are really bad but states she is starting to have post nasal drip into her chest.  Pt states she has congestion and sinus congestion and ear congestion. Pt states taking over the counter Alka seltzer.  Pt states severe symptoms started three days ago.  Pt states she uses flonase and Xyzal as needed. Pt states she has a dry cough. Pt states cough is dry and non productive but keeps her up at night.   HPI: HPI  Problems:  Patient Active Problem List   Diagnosis Date Noted   Obesity (BMI 30-39.9) 01/29/2024   Normal labor 05/02/2023   Anxiety and depression 11/09/2021   Gallstones 07/24/2019   Hypothyroidism 03/31/2019    Allergies:  Allergies  Allergen Reactions   Azithromycin     GI-- stomach cramps   Penicillins Hives    Did it involve swelling of the face/tongue/throat, SOB, or low BP? No Did it involve sudden or severe rash/hives, skin peeling, or any reaction on the inside of your mouth or nose? No Did you need to seek medical attention at a hospital or doctor's office? No When did it last happen?  3-4 years ago   If all above answers are "NO", may proceed with cephalosporin use.    Medications:  Current Outpatient Medications:    benzonatate  (TESSALON ) 100 MG capsule, Take 1 capsule (100 mg total) by mouth 3 (three) times daily as needed for up to 7 days., Disp: 21 capsule, Rfl: 0   guaiFENesin  (ROBITUSSIN) 100 MG/5ML liquid, Take 5 mLs by mouth every 4 (four) hours as needed for up to 7 days for cough or to loosen phlegm., Disp: 120 mL, Rfl: 0   fluticasone (FLONASE)  50 MCG/ACT nasal spray, Place 2 sprays into both nostrils daily., Disp: , Rfl:    NP THYROID  30 MG tablet, TAKE 1 TABLET(30 MG) BY MOUTH DAILY BEFORE BREAKFAST, Disp: 90 tablet, Rfl: 1   omeprazole  (PRILOSEC  OTC) 20 MG tablet, Take 1 tablet (20 mg total) by mouth daily., Disp: 30 tablet, Rfl: 3   phentermine  37.5 MG capsule, Take 1 capsule (37.5 mg total) by mouth every morning., Disp: 90 capsule, Rfl: 0  Observations/Objective: Patient is well-developed, well-nourished in no acute distress.  Resting comfortably at home.  Head is normocephalic, atraumatic.  No labored breathing.  Speech is clear and coherent with logical content.  Patient is alert and oriented at baseline.    Assessment and Plan: 1. Upper respiratory tract infection, unspecified type (Primary) - benzonatate  (TESSALON ) 100 MG capsule; Take 1 capsule (100 mg total) by mouth 3 (three) times daily as needed for up to 7 days.  Dispense: 21 capsule; Refill: 0 - guaiFENesin  (ROBITUSSIN) 100 MG/5ML liquid; Take 5 mLs by mouth every 4 (four) hours as needed for up to 7 days for cough or to loosen phlegm.  Dispense: 120 mL; Refill: 0  -Start Tessalon  and Guaifenesin  as needed  -Continue Xyzal and Flonase as needed -Advised Pt to F/U with PCP or in person urgent care for worsening symptoms.   Follow Up Instructions: I discussed the assessment and treatment plan with the patient. The patient was provided an opportunity to ask questions and all were answered. The patient agreed with the plan and demonstrated an understanding of the instructions.  A copy of instructions were sent to the patient via MyChart unless otherwise noted below.    The patient was advised to call back or seek an in-person evaluation if the symptoms worsen or if the condition fails to improve as anticipated.    Sarah Roys, PA-C

## 2024-02-08 NOTE — Patient Instructions (Signed)
 Lacoya Polidore, thank you for joining Louvenia Roys, PA-C for today's virtual visit.  While this provider is not your primary care provider (PCP), if your PCP is located in our provider database this encounter information will be shared with them immediately following your visit.   A [facility] MyChart account gives you access to today's visit and all your visits, tests, and labs performed at [facility] " click here if you don't have a [facility] MyChart account or go to [email or web address]  Consent: (Patient) Tatijana Willmann provided verbal consent for this virtual visit at the beginning of the encounter.  Current Medications:  Current Outpatient Medications:    benzonatate  (TESSALON ) 100 MG capsule, Take 1 capsule (100 mg total) by mouth 3 (three) times daily as needed for up to 7 days., Disp: 21 capsule, Rfl: 0   guaiFENesin  (ROBITUSSIN) 100 MG/5ML liquid, Take 5 mLs by mouth every 4 (four) hours as needed for up to 7 days for cough or to loosen phlegm., Disp: 120 mL, Rfl: 0   fluticasone (FLONASE) 50 MCG/ACT nasal spray, Place 2 sprays into both nostrils daily., Disp: , Rfl:    NP THYROID  30 MG tablet, TAKE 1 TABLET(30 MG) BY MOUTH DAILY BEFORE BREAKFAST, Disp: 90 tablet, Rfl: 1   omeprazole  (PRILOSEC  OTC) 20 MG tablet, Take 1 tablet (20 mg total) by mouth daily., Disp: 30 tablet, Rfl: 3   phentermine  37.5 MG capsule, Take 1 capsule (37.5 mg total) by mouth every morning., Disp: 90 capsule, Rfl: 0   Medications ordered in this encounter:  Meds ordered this encounter  Medications   benzonatate  (TESSALON ) 100 MG capsule    Sig: Take 1 capsule (100 mg total) by mouth 3 (three) times daily as needed for up to 7 days.    Dispense:  21 capsule    Refill:  0   guaiFENesin  (ROBITUSSIN) 100 MG/5ML liquid    Sig: Take 5 mLs by mouth every 4 (four) hours as needed for up to 7 days for cough or to loosen phlegm.    Dispense:  120 mL    Refill:  0     *If you need  refills on other medications prior to your next appointment, please contact your pharmacy*  Follow-Up: Call back or seek an in-person evaluation if the symptoms worsen or if the condition fails to improve as anticipated.  [facility] [phone]  Other Instructions Upper Respiratory Infection, Adult An upper respiratory infection (URI) is a common viral infection of the nose, throat, and upper air passages that lead to the lungs. The most common type of URI is the common cold. URIs usually get better on their own, without medical treatment. What are the causes? A URI is caused by a virus. You may catch a virus by: Breathing in droplets from an infected person's cough or sneeze. Touching something that has been exposed to the virus (is contaminated) and then touching your mouth, nose, or eyes. What increases the risk? You are more likely to get a URI if: You are very young or very old. You have close contact with others, such as at work, school, or a health care facility. You smoke. You have long-term (chronic) heart or lung disease. You have a weakened disease-fighting system (immune system). You have nasal allergies or asthma. You are experiencing a lot of stress. You have poor nutrition. What are the signs or symptoms? A URI usually involves some of the following symptoms: Runny or stuffy (congested) nose. Cough.  Sneezing. Sore throat. Headache. Fatigue. Fever. Loss of appetite. Pain in your forehead, behind your eyes, and over your cheekbones (sinus pain). Muscle aches. Redness or irritation of the eyes. Pressure in the ears or face. How is this diagnosed? This condition may be diagnosed based on your medical history and symptoms, and a physical exam. Your health care provider may use a swab to take a mucus sample from your nose (nasal swab). This sample can be tested to determine what virus is causing the illness. How is this treated? URIs usually get  better on their own within 7-10 days. Medicines cannot cure URIs, but your health care provider may recommend certain medicines to help relieve symptoms, such as: Over-the-counter cold medicines. Cough suppressants. Coughing is a type of defense against infection that helps to clear the respiratory system, so take these medicines only as recommended by your health care provider. Fever-reducing medicines. Follow these instructions at home: Activity Rest as needed. If you have a fever, stay home from work or school until your fever is gone or until your health care provider says your URI cannot spread to other people (is no longer contagious). Your health care provider may have you wear a face mask to prevent your infection from spreading. Relieving symptoms Gargle with a mixture of salt and water 3-4 times a day or as needed. To make salt water, completely dissolve -1 tsp (3-6 g) of salt in 1 cup (237 mL) of warm water. Use a cool-mist humidifier to add moisture to the air. This can help you breathe more easily. Eating and drinking  Drink enough fluid to keep your urine pale yellow. Eat soups and other clear broths. General instructions  Take over-the-counter and prescription medicines only as told by your health care provider. These include cold medicines, fever reducers, and cough suppressants. Do not use any products that contain nicotine or tobacco. These products include cigarettes, chewing tobacco, and vaping devices, such as e-cigarettes. If you need help quitting, ask your health care provider. Stay away from secondhand smoke. Stay up to date on all immunizations, including the yearly (annual) flu vaccine. Keep all follow-up visits. This is important. How to prevent the spread of infection to others URIs can be contagious. To prevent the infection from spreading: Wash your hands with soap and water for at least 20 seconds. If soap and water are not available, use hand  sanitizer. Avoid touching your mouth, face, eyes, or nose. Cough or sneeze into a tissue or your sleeve or elbow instead of into your hand or into the air.  Contact a health care provider if: You are getting worse instead of better. You have a fever or chills. Your mucus is brown or red. You have yellow or brown discharge coming from your nose. You have pain in your face, especially when you bend forward. You have swollen neck glands. You have pain while swallowing. You have white areas in the back of your throat. Get help right away if: You have shortness of breath that gets worse. You have severe or persistent: Headache. Ear pain. Sinus pain. Chest pain. You have chronic lung disease along with any of the following: Making high-pitched whistling sounds when you breathe, most often when you breathe out (wheezing). Prolonged cough (more than 14 days). Coughing up blood. A change in your usual mucus. You have a stiff neck. You have changes in your: Vision. Hearing. Thinking. Mood. These symptoms may be an emergency. Get help right away. Call 911. Do not  wait to see if the symptoms will go away. Do not drive yourself to the hospital. Summary An upper respiratory infection (URI) is a common infection of the nose, throat, and upper air passages that lead to the lungs. A URI is caused by a virus. URIs usually get better on their own within 7-10 days. Medicines cannot cure URIs, but your health care provider may recommend certain medicines to help relieve symptoms. This information is not intended to replace advice given to you by your health care provider. Make sure you discuss any questions you have with your health care provider. Document Revised: 05/10/2021 Document Reviewed: 05/10/2021 Elsevier Patient Education  2024 Elsevier Inc.   If you have been instructed to have an in-person evaluation today at a local Urgent Care facility, please use the link below. It will take  you to a list of all of our available [facility], including address, phone number and hours of operation. Please do not delay care.  [facility]  If you or a family member do not have a primary care provider, use the link below to schedule a visit and establish care. When you choose a [facility] primary care physician or advanced practice provider, you gain a long-term partner in health. Find a Primary Care Provider  Learn more about [facility] in-office and virtual care options: [facility] - Get Care Now

## 2024-02-20 ENCOUNTER — Institutional Professional Consult (permissible substitution) (INDEPENDENT_AMBULATORY_CARE_PROVIDER_SITE_OTHER): Payer: BC Managed Care – PPO

## 2024-03-02 ENCOUNTER — Encounter: Payer: Self-pay | Admitting: Family Medicine

## 2024-03-13 ENCOUNTER — Encounter: Payer: Self-pay | Admitting: Family Medicine

## 2024-03-13 ENCOUNTER — Ambulatory Visit: Admitting: Family Medicine

## 2024-03-13 VITALS — BP 112/70 | HR 92 | Temp 98.0°F | Ht 64.0 in | Wt 191.4 lb

## 2024-03-13 DIAGNOSIS — E669 Obesity, unspecified: Secondary | ICD-10-CM | POA: Diagnosis not present

## 2024-03-13 NOTE — Assessment & Plan Note (Signed)
 Chronic problem.  Pt is down 7 lbs since starting Phentermine .  She initially had palpitations but this resolved w/in 2 weeks.  Feels that she has plateau'ed.  Encouraged her to continue working on healthy diet and regular exercise while on the medication.  Will continue to follow.

## 2024-03-13 NOTE — Progress Notes (Signed)
   Subjective:    Patient ID: Sarah [name], female    DOB: [birth year], 33 y.o.   MRN: [id number]  HPI Obesity- pt was started on Phentermine  on 4/9 and is down 7 lbs since then.  When she first started medication she had some anxiety, palpitations.  This resolved within 2 weeks.  Felt that she dropped some weight immediately and then plateau'ed.  Exercising regularly- at least 5x/week.  Pt reports making healthy food choices.  BP WNL, HR WNL.     Review of Systems For ROS see HPI     Objective:   Physical Exam Vitals reviewed.  Constitutional:      General: She is not in acute distress.    Appearance: Normal appearance. She is not ill-appearing.  HENT:     Head: Normocephalic and atraumatic.  Eyes:     Extraocular Movements: Extraocular movements intact.     Conjunctiva/sclera: Conjunctivae normal.  Cardiovascular:     Rate and Rhythm: Normal rate and regular rhythm.  Pulmonary:     Effort: Pulmonary effort is normal. No respiratory distress.     Breath sounds: Normal breath sounds.  Skin:    General: Skin is warm and dry.  Neurological:     General: No focal deficit present.     Mental Status: She is alert and oriented to person, place, and time.  Psychiatric:        Mood and Affect: Mood normal.        Behavior: Behavior normal.        Thought Content: Thought content normal.           Assessment & Plan:

## 2024-03-13 NOTE — Patient Instructions (Signed)
 Schedule your complete physical in 6 months Finish the phentermine  as directed Continue to work on healthy diet and regular exercise- you're doing great! Call with any questions or concerns Stay Safe!  Stay Healthy! Have a great summer!!!

## 2024-05-14 ENCOUNTER — Ambulatory Visit (INDEPENDENT_AMBULATORY_CARE_PROVIDER_SITE_OTHER): Admitting: Otolaryngology

## 2024-05-14 ENCOUNTER — Other Ambulatory Visit (HOSPITAL_BASED_OUTPATIENT_CLINIC_OR_DEPARTMENT_OTHER): Payer: Self-pay

## 2024-05-14 ENCOUNTER — Encounter (INDEPENDENT_AMBULATORY_CARE_PROVIDER_SITE_OTHER): Payer: Self-pay | Admitting: Otolaryngology

## 2024-05-14 VITALS — BP 121/86 | HR 82 | Ht 64.0 in | Wt 183.0 lb

## 2024-05-14 DIAGNOSIS — J0181 Other acute recurrent sinusitis: Secondary | ICD-10-CM

## 2024-05-14 DIAGNOSIS — J343 Hypertrophy of nasal turbinates: Secondary | ICD-10-CM | POA: Diagnosis not present

## 2024-05-14 DIAGNOSIS — R0981 Nasal congestion: Secondary | ICD-10-CM | POA: Diagnosis not present

## 2024-05-14 DIAGNOSIS — J3489 Other specified disorders of nose and nasal sinuses: Secondary | ICD-10-CM

## 2024-05-14 DIAGNOSIS — H8120 Vestibular neuronitis, unspecified ear: Secondary | ICD-10-CM | POA: Diagnosis not present

## 2024-05-14 DIAGNOSIS — J3089 Other allergic rhinitis: Secondary | ICD-10-CM

## 2024-05-14 MED ORDER — AZELASTINE HCL 0.1 % NA SOLN
2.0000 | Freq: Every day | NASAL | 12 refills | Status: AC
  Filled 2024-05-14 – 2024-05-25 (×2): qty 30, 30d supply, fill #0
  Filled 2024-09-04: qty 30, 30d supply, fill #1
  Filled 2024-10-13: qty 30, 30d supply, fill #2
  Filled 2024-11-07: qty 30, 30d supply, fill #3

## 2024-05-14 MED ORDER — DOXYCYCLINE HYCLATE 100 MG PO TABS
100.0000 mg | ORAL_TABLET | Freq: Two times a day (BID) | ORAL | 0 refills | Status: AC
  Filled 2024-05-14 – 2024-05-25 (×2): qty 14, 7d supply, fill #0

## 2024-05-14 NOTE — Patient Instructions (Addendum)
 I have ordered an imaging study for you to complete prior to your next visit. Please call [facility] Scheduling at [phone] to schedule your imaging if you have not received a call within 24 hours. If you are unable to complete your imaging study prior to your next scheduled visit please call our office to let us  know.   Use two sprays of flonase in each nostril daily; right after, use astelin  spray two sprays each nostril daily  Aureliano Med Nasal Saline Rinse - use daily; stop 3 days before CT scan - start nasal saline rinses with NeilMed Bottle available over the counter    Nasal Saline Irrigation instructions: If you choose to make your own salt water solution, You will need: Salt (kosher, canning, or pickling salt) Baking soda Nasal irrigation bottle (i.e. Aureliano Med Sinus Rinse) Measuring spoon ( teaspoon) Distilled / boiled water   Mix solution Mix 1 teaspoon of salt, 1/2 teaspoon of baking soda and 1 cup of water into irrigation bottle ** May use saline packet instead of homemade recipe for this step if you prefer If medicine was prescribed to be mixed with solution, place this into bottle Examples 2 inches of 2% mupirocin ointment Budesonide solution Position your head: Lean over sink (about 45 degrees) Rotate head (about 45 degrees) so that one nostril is above the other Irrigate Insert tip of irrigation bottle into upper nostril so it forms a comfortable seal Irrigate while breathing through your mouth May remove the straw from the bottle in order to irrigate the entire solution (important if medicine was added) Exhale through nose when finished and blow nose as necessary  Repeat on opposite side with other 1/2 of solution (120 mL) or remake solution if all 240 mL was used on first side Wash irrigation bottle regularly, replace every 3 months

## 2024-05-14 NOTE — Progress Notes (Signed)
 Dear Dr. Mahlon, Here is my assessment for our mutual patient, Sarah [name]. Thank you for allowing me the opportunity to care for your patient. Please do not hesitate to contact me should you have any other questions. Sincerely, Dr. Eldora Blanch  Otolaryngology Clinic Note Referring provider: Dr. Mahlon HPI:  Sarah [name] is a 33 y.o. female kindly referred by Dr. Mahlon for evaluation of vertigo.   Initial visit (04/2024): Patient reports: she reports that she had some kind of virus in February and had URI symptoms with ear pressure (bilateral) She then started to have frank vertigo (non-stop) and it lasted about 3 weeks. Scared drive, had to hold on to things, Nausea. Slowly improved and one day resolved. No hearing change during except slightly muffled. No tinnitus. She was prescribed steroids and meclizine . Last meclizine  use was in Feb. Completely back to normal now. Patient denies: ear pain, fullness, vertigo, drainage, tinnitus, hearing change. Patient additionally denies: deep pain in ear canal, eustachian tube symptoms such as popping/crackling, sensitive to pressure changes Patient also denies barotrauma, vestibular suppressant use, ototoxic medication use Prior ear surgery: no  She had seen ENT in the past in [place] for sinus issues and was told she had narrow nasal passages.  She does report that she has a fair amount of allergies and sinus infections. She reports that she gets about 5-6 sinus infections/year. Symptoms during sinus infections include congestion, ear clogging, PND, and sometimes with cough with some discolored drainage. Bilateral max pressure. Does have young kids at home. More so in winter and spring. Last infection was in June. Does gets antibiotics for this -- doxycycline  typically. Abx do help. Does report nasal congestion and obstruction which also occurs even without infections. Between infections she does recover completely.  Typical AR sx: now  perennial. Using flonase, xyzal PRN, and pataday.  Never used astelin . Never had allergy testing.   ENT Surgery: no Personal or FHx of bleeding dz or anesthesia difficulty: no  GLP-1: no AP/AC: no  Tobacco: no  PMHx: Hypothyroidism  Independent Review of Additional Tests or Records:  Dr. Mahlon (12/03/2023): Noted intermittent vertigo never had before - more so since giving birth; off balance; + R ear pain; Dx: Vertigo; Rx: meclizine , pred taper, ENT ref CT Head 12/17/2023 independently interpreted: cuts thick so suboptimal eval but mastoids and ME without effusion; no obvious ossicular chain or otic capsule abnormalities; incompletely visualized paranasal sinuses but visualized sinuses clear CBC w/diff 01/29/2024: WBC 5.7, Eos 200 PMH/Meds/All/SocHx/FamHx/ROS:   Past Medical History:  Diagnosis Date   Anxiety    Bell's palsy    Gall stones    Gestational hypertension 10/24/2019   Headache    migraines   Hypothyroidism    Ovarian cyst    Thyroid  disease      Past Surgical History:  Procedure Laterality Date   CHOLECYSTECTOMY N/A 01/25/2020   Procedure: LAPAROSCOPIC CHOLECYSTECTOMY WITH INTRAOPERATIVE CHOLANGIOGRAM;  Surgeon: Curvin Deward MOULD, MD;  Location: [facility] OR;  Service: General;  Laterality: N/A;   LASIK  2017   NO PAST SURGERIES     wisdom tooth removal      Family History  Problem Relation Age of Onset   Hypertension Mother    Hashimoto's thyroiditis Mother    Hypertension Father    Cancer Maternal Grandmother        bone marrow cancer     Social Connections: Moderately Integrated (01/29/2024)   Social Connection and Isolation Panel    Frequency of Communication with Friends  and Family: Twice a week    Frequency of Social Gatherings with Friends and Family: Once a week    Attends Religious Services: Never    Database administrator or Organizations: Yes    Attends Banker Meetings: 1 to 4 times per year    Marital Status: Married      Current  Outpatient Medications:    azelastine  (ASTELIN ) 0.1 % nasal spray, Place 2 sprays into both nostrils daily., Disp: 30 mL, Rfl: 12   doxycycline  (VIBRA -TABS) 100 MG tablet, Take 1 tablet (100 mg total) by mouth 2 (two) times daily for 7 days., Disp: 14 tablet, Rfl: 0   fluticasone (FLONASE) 50 MCG/ACT nasal spray, Place 2 sprays into both nostrils daily., Disp: , Rfl:    levocetirizine (XYZAL) 2.5 MG/5ML solution, Take 2.5 mg by mouth every evening., Disp: , Rfl:    NP THYROID  30 MG tablet, TAKE 1 TABLET(30 MG) BY MOUTH DAILY BEFORE BREAKFAST, Disp: 90 tablet, Rfl: 1   fexofenadine (ALLEGRA) 180 MG tablet, Take 180 mg by mouth daily. (Patient not taking: Reported on 05/14/2024), Disp: , Rfl:    omeprazole  (PRILOSEC  OTC) 20 MG tablet, Take 1 tablet (20 mg total) by mouth daily. (Patient not taking: Reported on 05/14/2024), Disp: 30 tablet, Rfl: 3   phentermine  37.5 MG capsule, Take 1 capsule (37.5 mg total) by mouth every morning. (Patient not taking: Reported on 05/14/2024), Disp: 90 capsule, Rfl: 0   Physical Exam:   BP 121/86 (BP Location: Right Arm, Patient Position: Sitting, Cuff Size: Large)   Pulse 82   Ht 5' 4 (1.626 m)   Wt 183 lb (83 kg)   SpO2 98%   BMI 31.41 kg/m   Salient findings:  CN II-XII intact Bilateral EAC clear and TM intact with well pneumatized middle ear spaces Weber 512: mid Rinne 512: AC > BC b/l  Anterior rhinoscopy: Septum deviates left; bilateral inferior turbinates with modest hypertrophy; nasal endo deferred today No lesions of oral cavity/oropharynx No obviously palpable neck masses/lymphadenopathy/thyromegaly No respiratory distress or stridor No gross nystagmus  Seprately Identifiable Procedures:  Prior to initiating any procedures, risks/benefits/alternatives were explained to the patient and verbal consent obtained. None today  Impression & Plans:  Sarah [name] is a 33 y.o. female with:  1. Vestibular neuronitis, unspecified laterality   2.  Other acute recurrent sinusitis   3. Nasal congestion   4. Hypertrophy of both inferior nasal turbinates   5. Nasal obstruction   6. Other allergic rhinitis    Multiple issues today:  - Ear wise: most likely vest neuronitis; completely recovered with prior CT reassuring; we discussed etiology and options; will get baseline audio to ensure not labyrinthitis - From nasal standpoint, chronic congestion and recurrent sinusitis; she has had medical mgmt for this as well; we discussed options and will start with flonase and astelin  BID with daily rinses; will do doxy BID (recent abx as well); will also get post-treatment CT  - f/u in 6 weeks with audiogram, sooner as necessary - Consider septo/turbs, limited fess as needed  See below regarding exact medications prescribed this encounter including dosages and route: Meds ordered this encounter  Medications   azelastine  (ASTELIN ) 0.1 % nasal spray    Sig: Place 2 sprays into both nostrils daily.    Dispense:  30 mL    Refill:  12   doxycycline  (VIBRA -TABS) 100 MG tablet    Sig: Take 1 tablet (100 mg total) by mouth 2 (two) times  daily for 7 days.    Dispense:  14 tablet    Refill:  0      Thank you for allowing me the opportunity to care for your patient. Please do not hesitate to contact me should you have any other questions.  Sincerely, Eldora Blanch, MD Otolaryngologist (ENT), [facility] Phone: [phone] Fax: [phone]  05/14/2024, 10:13 AM   MDM:  Level 4 - [phone] Complexity/Problems addressed: mod - chronic problems Data complexity: mod - independent interpretation of imaging - Morbidity: mod  - Prescription Drug prescribed or managed: y

## 2024-05-25 ENCOUNTER — Other Ambulatory Visit (HOSPITAL_BASED_OUTPATIENT_CLINIC_OR_DEPARTMENT_OTHER): Payer: Self-pay

## 2024-06-26 ENCOUNTER — Ambulatory Visit: Admitting: Family Medicine

## 2024-06-30 ENCOUNTER — Ambulatory Visit (INDEPENDENT_AMBULATORY_CARE_PROVIDER_SITE_OTHER): Admitting: Audiology

## 2024-06-30 ENCOUNTER — Ambulatory Visit (INDEPENDENT_AMBULATORY_CARE_PROVIDER_SITE_OTHER): Admitting: Otolaryngology

## 2024-07-08 ENCOUNTER — Encounter

## 2024-08-24 ENCOUNTER — Encounter: Payer: Self-pay | Admitting: Radiology

## 2024-09-04 ENCOUNTER — Other Ambulatory Visit: Payer: Self-pay | Admitting: Family Medicine

## 2024-09-04 ENCOUNTER — Other Ambulatory Visit (HOSPITAL_BASED_OUTPATIENT_CLINIC_OR_DEPARTMENT_OTHER): Payer: Self-pay

## 2024-09-04 MED ORDER — PHENTERMINE HCL 37.5 MG PO CAPS
37.5000 mg | ORAL_CAPSULE | ORAL | 0 refills | Status: AC
  Filled 2024-09-04: qty 90, 90d supply, fill #0

## 2024-09-04 NOTE — Telephone Encounter (Signed)
 Requested Prescriptions   Pending Prescriptions Disp Refills   phentermine  37.5 MG capsule 90 capsule 0    Sig: Take 1 capsule (37.5 mg total) by mouth every morning.     Date of patient request: 09/04/2024 Last office visit: 03/13/2024 Upcoming visit: 09/29/2024 Date of last refill: 01/29/2024 Last refill amount: 90

## 2024-09-28 ENCOUNTER — Telehealth: Payer: Self-pay | Admitting: Family Medicine

## 2024-09-28 NOTE — Telephone Encounter (Signed)
 Called patient on 12/8 to reschedule appointment. It was announced we will have a delayed opening on 12/9 due to weather. We will open at 10am.

## 2024-09-29 ENCOUNTER — Encounter: Admitting: Family Medicine

## 2024-10-13 ENCOUNTER — Other Ambulatory Visit (HOSPITAL_BASED_OUTPATIENT_CLINIC_OR_DEPARTMENT_OTHER): Payer: Self-pay

## 2024-10-13 ENCOUNTER — Encounter: Payer: Self-pay | Admitting: Family Medicine

## 2024-10-13 ENCOUNTER — Other Ambulatory Visit: Payer: Self-pay

## 2024-10-13 DIAGNOSIS — E039 Hypothyroidism, unspecified: Secondary | ICD-10-CM

## 2024-10-13 MED ORDER — THYROID 30 MG PO TABS
30.0000 mg | ORAL_TABLET | Freq: Every day | ORAL | 1 refills | Status: AC
  Filled 2024-10-13: qty 90, 90d supply, fill #0

## 2024-11-07 ENCOUNTER — Other Ambulatory Visit (HOSPITAL_BASED_OUTPATIENT_CLINIC_OR_DEPARTMENT_OTHER): Payer: Self-pay

## 2024-11-19 ENCOUNTER — Other Ambulatory Visit (HOSPITAL_BASED_OUTPATIENT_CLINIC_OR_DEPARTMENT_OTHER): Payer: Self-pay
